# Patient Record
Sex: Male | Born: 1935 | Race: White | Hispanic: No | Marital: Married | Smoking: Never smoker
Health system: Southern US, Community
[De-identification: ages and names within clinical notes are randomized; demographics above are authoritative.]

## PROBLEM LIST (undated history)

## (undated) DIAGNOSIS — I2510 Atherosclerotic heart disease of native coronary artery without angina pectoris: Secondary | ICD-10-CM

## (undated) DIAGNOSIS — I10 Essential (primary) hypertension: Secondary | ICD-10-CM

## (undated) DIAGNOSIS — G609 Hereditary and idiopathic neuropathy, unspecified: Secondary | ICD-10-CM

## (undated) DIAGNOSIS — E538 Deficiency of other specified B group vitamins: Secondary | ICD-10-CM

## (undated) DIAGNOSIS — Z95810 Presence of automatic (implantable) cardiac defibrillator: Secondary | ICD-10-CM

## (undated) DIAGNOSIS — I5022 Chronic systolic (congestive) heart failure: Secondary | ICD-10-CM

## (undated) DIAGNOSIS — K219 Gastro-esophageal reflux disease without esophagitis: Secondary | ICD-10-CM

## (undated) DIAGNOSIS — Z951 Presence of aortocoronary bypass graft: Secondary | ICD-10-CM

## (undated) DIAGNOSIS — F3289 Other specified depressive episodes: Secondary | ICD-10-CM

## (undated) DIAGNOSIS — D539 Nutritional anemia, unspecified: Secondary | ICD-10-CM

## (undated) DIAGNOSIS — I639 Cerebral infarction, unspecified: Secondary | ICD-10-CM

## (undated) DIAGNOSIS — F329 Major depressive disorder, single episode, unspecified: Secondary | ICD-10-CM

## (undated) DIAGNOSIS — G20 Parkinson's disease: Secondary | ICD-10-CM

## (undated) DIAGNOSIS — E7800 Pure hypercholesterolemia, unspecified: Secondary | ICD-10-CM

## (undated) DIAGNOSIS — E109 Type 1 diabetes mellitus without complications: Secondary | ICD-10-CM

## (undated) DIAGNOSIS — N186 End stage renal disease: Secondary | ICD-10-CM

## (undated) DIAGNOSIS — G4733 Obstructive sleep apnea (adult) (pediatric): Secondary | ICD-10-CM

## (undated) DIAGNOSIS — K3184 Gastroparesis: Secondary | ICD-10-CM

## (undated) DIAGNOSIS — G20A1 Parkinson's disease without dyskinesia, without mention of fluctuations: Secondary | ICD-10-CM

## (undated) DIAGNOSIS — R569 Unspecified convulsions: Secondary | ICD-10-CM

## (undated) HISTORY — DX: Cerebral infarction, unspecified: I63.9

## (undated) HISTORY — DX: Essential (primary) hypertension: I10

## (undated) HISTORY — DX: Parkinson's disease without dyskinesia, without mention of fluctuations: G20.A1

## (undated) HISTORY — DX: Gastro-esophageal reflux disease without esophagitis: K21.9

## (undated) HISTORY — DX: Atherosclerotic heart disease of native coronary artery without angina pectoris: I25.10

## (undated) HISTORY — DX: Chronic systolic (congestive) heart failure: I50.22

## (undated) HISTORY — DX: Unspecified convulsions: R56.9

## (undated) HISTORY — DX: End stage renal disease: N18.6

## (undated) HISTORY — DX: Nutritional anemia, unspecified: D53.9

## (undated) HISTORY — DX: Deficiency of other specified B group vitamins: E53.8

## (undated) HISTORY — PX: SHUNT REPLACEMENT: SHX5403

## (undated) HISTORY — DX: Type 1 diabetes mellitus without complications: E10.9

## (undated) HISTORY — PX: CORONARY ARTERY BYPASS GRAFT: SHX141

## (undated) HISTORY — PX: CATARACT EXTRACTION, BILATERAL: SHX1313

## (undated) HISTORY — DX: Major depressive disorder, single episode, unspecified: F32.9

## (undated) HISTORY — DX: Pure hypercholesterolemia, unspecified: E78.00

## (undated) HISTORY — DX: Presence of aortocoronary bypass graft: Z95.1

## (undated) HISTORY — DX: Gastroparesis: K31.84

## (undated) HISTORY — DX: Presence of automatic (implantable) cardiac defibrillator: Z95.810

## (undated) HISTORY — PX: DIALYSIS FISTULA CREATION: SHX611

## (undated) HISTORY — PX: PACEMAKER PLACEMENT: SHX43

## (undated) HISTORY — PX: DOPPLER ECHOCARDIOGRAPHY: SHX263

## (undated) HISTORY — DX: Parkinson's disease: G20

## (undated) HISTORY — DX: Other specified depressive episodes: F32.89

## (undated) HISTORY — DX: Obstructive sleep apnea (adult) (pediatric): G47.33

## (undated) HISTORY — DX: Hereditary and idiopathic neuropathy, unspecified: G60.9

---

## 2001-12-25 DIAGNOSIS — Z951 Presence of aortocoronary bypass graft: Secondary | ICD-10-CM

## 2001-12-25 HISTORY — DX: Presence of aortocoronary bypass graft: Z95.1

## 2002-09-23 ENCOUNTER — Encounter: Payer: Self-pay | Admitting: Cardiothoracic Surgery

## 2002-09-25 ENCOUNTER — Encounter: Payer: Self-pay | Admitting: Cardiothoracic Surgery

## 2002-09-25 ENCOUNTER — Inpatient Hospital Stay (HOSPITAL_COMMUNITY): Admission: RE | Admit: 2002-09-25 | Discharge: 2002-09-30 | Payer: Self-pay | Admitting: Cardiothoracic Surgery

## 2002-09-25 DIAGNOSIS — Z951 Presence of aortocoronary bypass graft: Secondary | ICD-10-CM

## 2002-09-26 ENCOUNTER — Encounter: Payer: Self-pay | Admitting: Cardiothoracic Surgery

## 2002-09-27 ENCOUNTER — Encounter: Payer: Self-pay | Admitting: Cardiothoracic Surgery

## 2002-09-28 ENCOUNTER — Encounter: Payer: Self-pay | Admitting: Cardiothoracic Surgery

## 2004-02-10 ENCOUNTER — Other Ambulatory Visit: Payer: Self-pay

## 2004-02-11 ENCOUNTER — Other Ambulatory Visit: Payer: Self-pay

## 2004-04-25 ENCOUNTER — Emergency Department (HOSPITAL_COMMUNITY): Admission: EM | Admit: 2004-04-25 | Discharge: 2004-04-25 | Payer: Self-pay

## 2004-04-26 ENCOUNTER — Encounter: Admission: RE | Admit: 2004-04-26 | Discharge: 2004-04-26 | Payer: Self-pay | Admitting: Internal Medicine

## 2004-04-29 ENCOUNTER — Inpatient Hospital Stay (HOSPITAL_COMMUNITY): Admission: AD | Admit: 2004-04-29 | Discharge: 2004-05-04 | Payer: Self-pay | Admitting: Internal Medicine

## 2004-04-30 ENCOUNTER — Encounter (INDEPENDENT_AMBULATORY_CARE_PROVIDER_SITE_OTHER): Payer: Self-pay | Admitting: Specialist

## 2004-05-03 ENCOUNTER — Encounter: Payer: Self-pay | Admitting: Cardiology

## 2004-05-09 ENCOUNTER — Inpatient Hospital Stay (HOSPITAL_COMMUNITY): Admission: EM | Admit: 2004-05-09 | Discharge: 2004-05-12 | Payer: Self-pay | Admitting: Emergency Medicine

## 2004-09-10 ENCOUNTER — Encounter: Admission: RE | Admit: 2004-09-10 | Discharge: 2004-09-10 | Payer: Self-pay | Admitting: Internal Medicine

## 2004-09-11 ENCOUNTER — Encounter: Admission: RE | Admit: 2004-09-11 | Discharge: 2004-09-11 | Payer: Self-pay | Admitting: Internal Medicine

## 2004-11-02 ENCOUNTER — Ambulatory Visit (HOSPITAL_COMMUNITY): Admission: RE | Admit: 2004-11-02 | Discharge: 2004-11-02 | Payer: Self-pay | Admitting: Neurology

## 2004-11-04 ENCOUNTER — Ambulatory Visit: Payer: Self-pay | Admitting: Oncology

## 2004-12-09 ENCOUNTER — Ambulatory Visit: Payer: Self-pay | Admitting: Cardiovascular Disease

## 2004-12-29 ENCOUNTER — Ambulatory Visit: Payer: Self-pay | Admitting: Endocrinology

## 2005-01-02 ENCOUNTER — Ambulatory Visit: Payer: Self-pay | Admitting: Oncology

## 2005-01-03 ENCOUNTER — Ambulatory Visit: Payer: Self-pay

## 2005-01-12 ENCOUNTER — Ambulatory Visit: Payer: Self-pay | Admitting: Endocrinology

## 2005-02-24 ENCOUNTER — Ambulatory Visit: Payer: Self-pay | Admitting: Oncology

## 2005-04-18 ENCOUNTER — Ambulatory Visit: Payer: Self-pay | Admitting: Endocrinology

## 2005-04-21 ENCOUNTER — Ambulatory Visit: Payer: Self-pay | Admitting: Oncology

## 2005-06-30 ENCOUNTER — Ambulatory Visit: Payer: Self-pay | Admitting: Oncology

## 2005-08-24 ENCOUNTER — Ambulatory Visit: Payer: Self-pay | Admitting: Oncology

## 2005-10-26 ENCOUNTER — Ambulatory Visit: Payer: Self-pay | Admitting: Oncology

## 2005-11-03 ENCOUNTER — Ambulatory Visit: Payer: Self-pay | Admitting: Endocrinology

## 2005-11-28 ENCOUNTER — Encounter (HOSPITAL_COMMUNITY): Admission: RE | Admit: 2005-11-28 | Discharge: 2006-02-26 | Payer: Self-pay | Admitting: Nephrology

## 2005-12-22 ENCOUNTER — Ambulatory Visit: Payer: Self-pay | Admitting: Oncology

## 2006-02-01 ENCOUNTER — Ambulatory Visit: Payer: Self-pay | Admitting: Cardiovascular Disease

## 2006-02-15 ENCOUNTER — Ambulatory Visit: Payer: Self-pay | Admitting: Oncology

## 2006-02-15 ENCOUNTER — Inpatient Hospital Stay (HOSPITAL_COMMUNITY): Admission: EM | Admit: 2006-02-15 | Discharge: 2006-02-19 | Payer: Self-pay | Admitting: Emergency Medicine

## 2006-02-22 ENCOUNTER — Ambulatory Visit (HOSPITAL_COMMUNITY): Admission: RE | Admit: 2006-02-22 | Discharge: 2006-02-22 | Payer: Self-pay | Admitting: Internal Medicine

## 2006-04-18 ENCOUNTER — Ambulatory Visit: Payer: Self-pay | Admitting: Oncology

## 2006-04-19 LAB — CBC WITH DIFFERENTIAL/PLATELET
BASO%: 1.1 % (ref 0.0–2.0)
EOS%: 1.4 % (ref 0.0–7.0)
HCT: 31.4 % — ABNORMAL LOW (ref 38.7–49.9)
MCHC: 34.4 g/dL (ref 32.0–35.9)
MONO#: 0.5 10*3/uL (ref 0.1–0.9)
NEUT%: 59.8 % (ref 40.0–75.0)
RBC: 3.51 10*6/uL — ABNORMAL LOW (ref 4.20–5.71)
RDW: 17.2 % — ABNORMAL HIGH (ref 11.2–14.6)
WBC: 4.1 10*3/uL (ref 4.0–10.0)
lymph#: 1.1 10*3/uL (ref 0.9–3.3)

## 2006-04-26 ENCOUNTER — Encounter: Admission: RE | Admit: 2006-04-26 | Discharge: 2006-04-26 | Payer: Self-pay | Admitting: Neurology

## 2006-05-17 LAB — COMPREHENSIVE METABOLIC PANEL
ALT: <8 U/L (ref 0–40)
Alkaline Phosphatase: 19 U/L — ABNORMAL LOW (ref 39–117)
Creatinine, Ser: 2.1 mg/dL — ABNORMAL HIGH (ref 0.4–1.5)
Glucose, Bld: 160 mg/dL — ABNORMAL HIGH (ref 70–99)
Sodium: 138 mEq/L (ref 135–145)
Total Bilirubin: 1.1 mg/dL (ref 0.3–1.2)
Total Protein: 6.7 g/dL (ref 6.0–8.3)

## 2006-05-17 LAB — IRON AND TIBC
%SAT: 31 % (ref 20–55)
Iron: 73 ug/dL (ref 42–165)
TIBC: 233 ug/dL (ref 215–435)

## 2006-05-17 LAB — CBC WITH DIFFERENTIAL/PLATELET
BASO%: 0.4 % (ref 0.0–2.0)
LYMPH%: 24.7 % (ref 14.0–48.0)
MCHC: 34.5 g/dL (ref 32.0–35.9)
MCV: 93.3 fL (ref 81.6–98.0)
MONO%: 12.9 % (ref 0.0–13.0)
Platelets: 150 10*3/uL (ref 145–400)
RBC: 3.07 10*6/uL — ABNORMAL LOW (ref 4.20–5.71)

## 2006-06-12 ENCOUNTER — Ambulatory Visit: Payer: Self-pay | Admitting: Oncology

## 2006-06-14 LAB — CBC WITH DIFFERENTIAL/PLATELET
Basophils Absolute: 0 10*3/uL (ref 0.0–0.1)
EOS%: 1.1 % (ref 0.0–7.0)
HCT: 31.9 % — ABNORMAL LOW (ref 38.7–49.9)
HGB: 11.1 g/dL — ABNORMAL LOW (ref 13.0–17.1)
LYMPH%: 24.5 % (ref 14.0–48.0)
MCH: 31.5 pg (ref 28.0–33.4)
MCHC: 35 g/dL (ref 32.0–35.9)
NEUT%: 61.5 % (ref 40.0–75.0)
Platelets: 140 10*3/uL — ABNORMAL LOW (ref 145–400)
lymph#: 1.1 10*3/uL (ref 0.9–3.3)

## 2006-07-12 LAB — CBC WITH DIFFERENTIAL/PLATELET
BASO%: 1 % (ref 0.0–2.0)
Basophils Absolute: 0 10*3/uL (ref 0.0–0.1)
Eosinophils Absolute: 0 10*3/uL (ref 0.0–0.5)
HCT: 33.1 % — ABNORMAL LOW (ref 38.7–49.9)
HGB: 11.4 g/dL — ABNORMAL LOW (ref 13.0–17.1)
LYMPH%: 25.7 % (ref 14.0–48.0)
MCHC: 34.3 g/dL (ref 32.0–35.9)
MONO#: 0.5 10*3/uL (ref 0.1–0.9)
NEUT#: 2.5 10*3/uL (ref 1.5–6.5)
NEUT%: 60 % (ref 40.0–75.0)
Platelets: 167 10*3/uL (ref 145–400)
WBC: 4.1 10*3/uL (ref 4.0–10.0)
lymph#: 1.1 10*3/uL (ref 0.9–3.3)

## 2006-07-27 ENCOUNTER — Ambulatory Visit: Payer: Self-pay | Admitting: Cardiovascular Disease

## 2006-08-12 ENCOUNTER — Ambulatory Visit: Payer: Self-pay | Admitting: Oncology

## 2006-08-21 LAB — CBC WITH DIFFERENTIAL/PLATELET
Basophils Absolute: 0 10*3/uL (ref 0.0–0.1)
Eosinophils Absolute: 0 10*3/uL (ref 0.0–0.5)
LYMPH%: 15.6 % (ref 14.0–48.0)
MCV: 90.3 fL (ref 81.6–98.0)
MONO%: 9.9 % (ref 0.0–13.0)
NEUT#: 3.4 10*3/uL (ref 1.5–6.5)
NEUT%: 73.2 % (ref 40.0–75.0)
Platelets: 146 10*3/uL (ref 145–400)
RBC: 3.39 10*6/uL — ABNORMAL LOW (ref 4.20–5.71)

## 2006-08-21 LAB — COMPREHENSIVE METABOLIC PANEL
Alkaline Phosphatase: 15 U/L — ABNORMAL LOW (ref 39–117)
BUN: 48 mg/dL — ABNORMAL HIGH (ref 6–23)
Creatinine, Ser: 2.1 mg/dL — ABNORMAL HIGH (ref 0.40–1.50)
Glucose, Bld: 168 mg/dL — ABNORMAL HIGH (ref 70–99)
Sodium: 140 mEq/L (ref 135–145)
Total Bilirubin: 0.9 mg/dL (ref 0.3–1.2)

## 2006-08-21 LAB — IRON AND TIBC
TIBC: 256 ug/dL (ref 215–435)
UIBC: 144 ug/dL

## 2006-08-21 LAB — LACTATE DEHYDROGENASE: LDH: 174 U/L (ref 94–250)

## 2006-09-18 LAB — CBC WITH DIFFERENTIAL/PLATELET
BASO%: 0.7 % (ref 0.0–2.0)
Eosinophils Absolute: 0.1 10*3/uL (ref 0.0–0.5)
LYMPH%: 21.5 % (ref 14.0–48.0)
MCHC: 35.4 g/dL (ref 32.0–35.9)
MONO#: 0.7 10*3/uL (ref 0.1–0.9)
NEUT#: 3.3 10*3/uL (ref 1.5–6.5)
Platelets: 206 10*3/uL (ref 145–400)
RBC: 3.53 10*6/uL — ABNORMAL LOW (ref 4.20–5.71)
RDW: 15.9 % — ABNORMAL HIGH (ref 11.2–14.6)
WBC: 5.3 10*3/uL (ref 4.0–10.0)
lymph#: 1.1 10*3/uL (ref 0.9–3.3)

## 2006-10-12 ENCOUNTER — Ambulatory Visit: Payer: Self-pay | Admitting: Oncology

## 2006-10-16 LAB — CBC WITH DIFFERENTIAL/PLATELET
Basophils Absolute: 0 10*3/uL (ref 0.0–0.1)
Eosinophils Absolute: 0 10*3/uL (ref 0.0–0.5)
HGB: 10 g/dL — ABNORMAL LOW (ref 13.0–17.1)
MCV: 89.3 fL (ref 81.6–98.0)
MONO%: 9.5 % (ref 0.0–13.0)
NEUT#: 3.4 10*3/uL (ref 1.5–6.5)
Platelets: 229 10*3/uL (ref 145–400)
RDW: 16.2 % — ABNORMAL HIGH (ref 11.2–14.6)

## 2006-10-17 ENCOUNTER — Ambulatory Visit: Payer: Self-pay | Admitting: Endocrinology

## 2006-11-13 LAB — CBC WITH DIFFERENTIAL/PLATELET
Basophils Absolute: 0 10*3/uL (ref 0.0–0.1)
EOS%: 1 % (ref 0.0–7.0)
Eosinophils Absolute: 0 10*3/uL (ref 0.0–0.5)
HGB: 11.1 g/dL — ABNORMAL LOW (ref 13.0–17.1)
MONO#: 0.5 10*3/uL (ref 0.1–0.9)
NEUT#: 3.3 10*3/uL (ref 1.5–6.5)
RDW: 15.9 % — ABNORMAL HIGH (ref 11.2–14.6)
lymph#: 0.8 10*3/uL — ABNORMAL LOW (ref 0.9–3.3)

## 2006-11-13 LAB — FERRITIN: Ferritin: 244 ng/mL (ref 22–322)

## 2006-11-13 LAB — IRON AND TIBC
%SAT: 30 % (ref 20–55)
TIBC: 261 ug/dL (ref 215–435)

## 2006-12-07 ENCOUNTER — Ambulatory Visit: Payer: Self-pay | Admitting: Oncology

## 2006-12-12 LAB — CBC WITH DIFFERENTIAL/PLATELET
Eosinophils Absolute: 0.1 10*3/uL (ref 0.0–0.5)
HCT: 33.6 % — ABNORMAL LOW (ref 38.7–49.9)
LYMPH%: 25.5 % (ref 14.0–48.0)
MCV: 89.5 fL (ref 81.6–98.0)
MONO%: 12.3 % (ref 0.0–13.0)
NEUT#: 2.5 10*3/uL (ref 1.5–6.5)
NEUT%: 60.1 % (ref 40.0–75.0)
Platelets: 185 10*3/uL (ref 145–400)
RBC: 3.75 10*6/uL — ABNORMAL LOW (ref 4.20–5.71)

## 2007-01-09 LAB — CBC WITH DIFFERENTIAL/PLATELET
BASO%: 0.5 % (ref 0.0–2.0)
LYMPH%: 20.6 % (ref 14.0–48.0)
MCHC: 33.5 g/dL (ref 32.0–35.9)
MONO#: 0.6 10*3/uL (ref 0.1–0.9)
NEUT#: 3 10*3/uL (ref 1.5–6.5)
RBC: 4.04 10*6/uL — ABNORMAL LOW (ref 4.20–5.71)
RDW: 15.5 % — ABNORMAL HIGH (ref 11.2–14.6)
WBC: 4.7 10*3/uL (ref 4.0–10.0)
lymph#: 1 10*3/uL (ref 0.9–3.3)

## 2007-01-29 ENCOUNTER — Ambulatory Visit: Payer: Self-pay | Admitting: Endocrinology

## 2007-01-29 LAB — CONVERTED CEMR LAB: Hgb A1c MFr Bld: 8 % — ABNORMAL HIGH (ref 4.6–6.0)

## 2007-02-01 ENCOUNTER — Ambulatory Visit: Payer: Self-pay | Admitting: Oncology

## 2007-02-06 LAB — CBC WITH DIFFERENTIAL/PLATELET
BASO%: 0.9 % (ref 0.0–2.0)
Eosinophils Absolute: 0.1 10*3/uL (ref 0.0–0.5)
HCT: 30.1 % — ABNORMAL LOW (ref 38.7–49.9)
HGB: 10.9 g/dL — ABNORMAL LOW (ref 13.0–17.1)
MCHC: 36.2 g/dL — ABNORMAL HIGH (ref 32.0–35.9)
MONO#: 0.7 10*3/uL (ref 0.1–0.9)
NEUT#: 2.8 10*3/uL (ref 1.5–6.5)
NEUT%: 55.9 % (ref 40.0–75.0)
WBC: 5.1 10*3/uL (ref 4.0–10.0)
lymph#: 1.4 10*3/uL (ref 0.9–3.3)

## 2007-03-06 LAB — CBC WITH DIFFERENTIAL/PLATELET
Basophils Absolute: 0 10*3/uL (ref 0.0–0.1)
EOS%: 1.1 % (ref 0.0–7.0)
HCT: 31.2 % — ABNORMAL LOW (ref 38.7–49.9)
HGB: 11.1 g/dL — ABNORMAL LOW (ref 13.0–17.1)
MCH: 32.2 pg (ref 28.0–33.4)
MONO#: 0.4 10*3/uL (ref 0.1–0.9)
NEUT%: 61.1 % (ref 40.0–75.0)
lymph#: 1.1 10*3/uL (ref 0.9–3.3)

## 2007-03-12 LAB — COMPREHENSIVE METABOLIC PANEL
AST: 16 U/L (ref 0–37)
Alkaline Phosphatase: 24 U/L — ABNORMAL LOW (ref 39–117)
BUN: 48 mg/dL — ABNORMAL HIGH (ref 6–23)
Calcium: 10.1 mg/dL (ref 8.4–10.5)
Creatinine, Ser: 2.53 mg/dL — ABNORMAL HIGH (ref 0.40–1.50)

## 2007-03-12 LAB — CBC WITH DIFFERENTIAL/PLATELET
Basophils Absolute: 0.1 10*3/uL (ref 0.0–0.1)
EOS%: 1.4 % (ref 0.0–7.0)
HCT: 31.9 % — ABNORMAL LOW (ref 38.7–49.9)
HGB: 11.5 g/dL — ABNORMAL LOW (ref 13.0–17.1)
MCH: 32.5 pg (ref 28.0–33.4)
MCV: 90.1 fL (ref 81.6–98.0)
MONO%: 12.7 % (ref 0.0–13.0)
NEUT%: 58.6 % (ref 40.0–75.0)

## 2007-03-12 LAB — FERRITIN: Ferritin: 216 ng/mL (ref 22–322)

## 2007-03-12 LAB — IRON AND TIBC: UIBC: 174 ug/dL

## 2007-03-29 ENCOUNTER — Ambulatory Visit: Payer: Self-pay | Admitting: Oncology

## 2007-04-03 LAB — CBC WITH DIFFERENTIAL/PLATELET
BASO%: 0.7 % (ref 0.0–2.0)
HCT: 31.3 % — ABNORMAL LOW (ref 38.7–49.9)
MCHC: 35.9 g/dL (ref 32.0–35.9)
MONO#: 0.6 10*3/uL (ref 0.1–0.9)
NEUT%: 69.1 % (ref 40.0–75.0)
WBC: 5.1 10*3/uL (ref 4.0–10.0)
lymph#: 0.9 10*3/uL (ref 0.9–3.3)

## 2007-04-09 ENCOUNTER — Ambulatory Visit: Payer: Self-pay | Admitting: Endocrinology

## 2007-05-01 LAB — CBC WITH DIFFERENTIAL/PLATELET
Basophils Absolute: 0 10*3/uL (ref 0.0–0.1)
Eosinophils Absolute: 0 10*3/uL (ref 0.0–0.5)
HCT: 30.9 % — ABNORMAL LOW (ref 38.7–49.9)
HGB: 11.1 g/dL — ABNORMAL LOW (ref 13.0–17.1)
MCH: 32.1 pg (ref 28.0–33.4)
MONO#: 0.4 10*3/uL (ref 0.1–0.9)
NEUT#: 2.5 10*3/uL (ref 1.5–6.5)
NEUT%: 63.7 % (ref 40.0–75.0)
WBC: 3.9 10*3/uL — ABNORMAL LOW (ref 4.0–10.0)
lymph#: 0.9 10*3/uL (ref 0.9–3.3)

## 2007-05-27 ENCOUNTER — Ambulatory Visit: Payer: Self-pay | Admitting: Oncology

## 2007-05-28 LAB — CBC WITH DIFFERENTIAL/PLATELET
Basophils Absolute: 0 10*3/uL (ref 0.0–0.1)
EOS%: 1.6 % (ref 0.0–7.0)
Eosinophils Absolute: 0.1 10*3/uL (ref 0.0–0.5)
HCT: 33.9 % — ABNORMAL LOW (ref 38.7–49.9)
HGB: 12.1 g/dL — ABNORMAL LOW (ref 13.0–17.1)
MCH: 31.6 pg (ref 28.0–33.4)
MCV: 88.6 fL (ref 81.6–98.0)
MONO%: 12 % (ref 0.0–13.0)
NEUT%: 65 % (ref 40.0–75.0)
Platelets: 166 10*3/uL (ref 145–400)

## 2007-06-25 LAB — COMPREHENSIVE METABOLIC PANEL
ALT: 15 U/L (ref 0–53)
AST: 19 U/L (ref 0–37)
Alkaline Phosphatase: 20 U/L — ABNORMAL LOW (ref 39–117)
Sodium: 139 mEq/L (ref 135–145)
Total Bilirubin: 0.9 mg/dL (ref 0.3–1.2)
Total Protein: 7.2 g/dL (ref 6.0–8.3)

## 2007-06-25 LAB — CBC WITH DIFFERENTIAL/PLATELET
BASO%: 0.8 % (ref 0.0–2.0)
HCT: 35.4 % — ABNORMAL LOW (ref 38.7–49.9)
HGB: 12.5 g/dL — ABNORMAL LOW (ref 13.0–17.1)
LYMPH%: 22.1 % (ref 14.0–48.0)
MCH: 31.4 pg (ref 28.0–33.4)
MONO#: 0.5 10*3/uL (ref 0.1–0.9)
MONO%: 12.5 % (ref 0.0–13.0)
NEUT#: 2.6 10*3/uL (ref 1.5–6.5)
RDW: 15.6 % — ABNORMAL HIGH (ref 11.2–14.6)
lymph#: 0.9 10*3/uL (ref 0.9–3.3)

## 2007-06-25 LAB — IRON AND TIBC
%SAT: 49 % (ref 20–55)
Iron: 122 ug/dL (ref 42–165)

## 2007-06-25 LAB — FERRITIN: Ferritin: 281 ng/mL (ref 22–322)

## 2007-07-18 ENCOUNTER — Ambulatory Visit: Payer: Self-pay | Admitting: Oncology

## 2007-07-23 LAB — CBC WITH DIFFERENTIAL/PLATELET
Basophils Absolute: 0 10*3/uL (ref 0.0–0.1)
EOS%: 1.4 % (ref 0.0–7.0)
HGB: 12.6 g/dL — ABNORMAL LOW (ref 13.0–17.1)
MCH: 32.1 pg (ref 28.0–33.4)
NEUT#: 2.8 10*3/uL (ref 1.5–6.5)
RDW: 15.1 % — ABNORMAL HIGH (ref 11.2–14.6)
lymph#: 1.1 10*3/uL (ref 0.9–3.3)

## 2007-07-29 ENCOUNTER — Encounter: Payer: Self-pay | Admitting: Endocrinology

## 2007-07-29 DIAGNOSIS — F329 Major depressive disorder, single episode, unspecified: Secondary | ICD-10-CM

## 2007-07-29 DIAGNOSIS — F3289 Other specified depressive episodes: Secondary | ICD-10-CM | POA: Insufficient documentation

## 2007-07-29 DIAGNOSIS — I10 Essential (primary) hypertension: Secondary | ICD-10-CM | POA: Insufficient documentation

## 2007-07-29 DIAGNOSIS — I2510 Atherosclerotic heart disease of native coronary artery without angina pectoris: Secondary | ICD-10-CM | POA: Insufficient documentation

## 2007-07-29 DIAGNOSIS — K219 Gastro-esophageal reflux disease without esophagitis: Secondary | ICD-10-CM

## 2007-08-08 ENCOUNTER — Ambulatory Visit: Payer: Self-pay | Admitting: Gastroenterology

## 2007-08-19 ENCOUNTER — Encounter: Payer: Self-pay | Admitting: Gastroenterology

## 2007-08-19 ENCOUNTER — Ambulatory Visit: Payer: Self-pay | Admitting: Gastroenterology

## 2007-08-19 ENCOUNTER — Encounter (INDEPENDENT_AMBULATORY_CARE_PROVIDER_SITE_OTHER): Payer: Self-pay | Admitting: *Deleted

## 2007-09-12 ENCOUNTER — Ambulatory Visit: Payer: Self-pay | Admitting: Oncology

## 2007-09-16 ENCOUNTER — Emergency Department (HOSPITAL_COMMUNITY): Admission: EM | Admit: 2007-09-16 | Discharge: 2007-09-16 | Payer: Self-pay | Admitting: Emergency Medicine

## 2007-09-17 LAB — CBC WITH DIFFERENTIAL/PLATELET
Basophils Absolute: 0 10*3/uL (ref 0.0–0.1)
Eosinophils Absolute: 0 10*3/uL (ref 0.0–0.5)
HGB: 11.7 g/dL — ABNORMAL LOW (ref 13.0–17.1)
MCV: 90.1 fL (ref 81.6–98.0)
MONO#: 0.4 10*3/uL (ref 0.1–0.9)
MONO%: 12.4 % (ref 0.0–13.0)
NEUT#: 2.5 10*3/uL (ref 1.5–6.5)
RDW: 15.1 % — ABNORMAL HIGH (ref 11.2–14.6)
lymph#: 0.5 10*3/uL — ABNORMAL LOW (ref 0.9–3.3)

## 2007-10-09 ENCOUNTER — Ambulatory Visit: Payer: Self-pay | Admitting: Cardiovascular Disease

## 2007-10-15 LAB — LACTATE DEHYDROGENASE: LDH: 197 U/L (ref 94–250)

## 2007-10-15 LAB — CBC WITH DIFFERENTIAL/PLATELET
Basophils Absolute: 0 10*3/uL (ref 0.0–0.1)
Eosinophils Absolute: 0 10*3/uL (ref 0.0–0.5)
HCT: 32.4 % — ABNORMAL LOW (ref 38.7–49.9)
HGB: 11.6 g/dL — ABNORMAL LOW (ref 13.0–17.1)
LYMPH%: 21.9 % (ref 14.0–48.0)
MCV: 88.7 fL (ref 81.6–98.0)
MONO%: 12.2 % (ref 0.0–13.0)
NEUT#: 2.4 10*3/uL (ref 1.5–6.5)
NEUT%: 64.2 % (ref 40.0–75.0)
Platelets: 179 10*3/uL (ref 145–400)

## 2007-10-15 LAB — IRON AND TIBC
%SAT: 37 % (ref 20–55)
Iron: 90 ug/dL (ref 42–165)
UIBC: 154 ug/dL

## 2007-10-15 LAB — COMPREHENSIVE METABOLIC PANEL
Albumin: 4.2 g/dL (ref 3.5–5.2)
Alkaline Phosphatase: 26 U/L — ABNORMAL LOW (ref 39–117)
BUN: 62 mg/dL — ABNORMAL HIGH (ref 6–23)
CO2: 27 mEq/L (ref 19–32)
Glucose, Bld: 154 mg/dL — ABNORMAL HIGH (ref 70–99)
Total Bilirubin: 0.9 mg/dL (ref 0.3–1.2)

## 2007-10-15 LAB — FERRITIN: Ferritin: 306 ng/mL (ref 22–322)

## 2007-10-18 ENCOUNTER — Ambulatory Visit: Payer: Self-pay | Admitting: Endocrinology

## 2007-10-18 ENCOUNTER — Encounter: Payer: Self-pay | Admitting: Endocrinology

## 2007-10-21 LAB — CONVERTED CEMR LAB: Hgb A1c MFr Bld: 6.8 % — ABNORMAL HIGH (ref 4.6–6.0)

## 2007-10-22 ENCOUNTER — Ambulatory Visit: Payer: Self-pay

## 2007-11-03 ENCOUNTER — Inpatient Hospital Stay (HOSPITAL_COMMUNITY): Admission: EM | Admit: 2007-11-03 | Discharge: 2007-11-11 | Payer: Self-pay | Admitting: Internal Medicine

## 2007-11-03 ENCOUNTER — Ambulatory Visit: Payer: Self-pay | Admitting: Cardiology

## 2007-11-03 ENCOUNTER — Ambulatory Visit: Payer: Self-pay | Admitting: Internal Medicine

## 2007-11-03 ENCOUNTER — Encounter: Payer: Self-pay | Admitting: Emergency Medicine

## 2007-11-05 ENCOUNTER — Encounter (INDEPENDENT_AMBULATORY_CARE_PROVIDER_SITE_OTHER): Payer: Self-pay | Admitting: Internal Medicine

## 2007-11-06 ENCOUNTER — Ambulatory Visit: Payer: Self-pay | Admitting: Internal Medicine

## 2007-11-07 ENCOUNTER — Encounter: Payer: Self-pay | Admitting: Pulmonary Disease

## 2007-11-07 ENCOUNTER — Ambulatory Visit: Payer: Self-pay | Admitting: Oncology

## 2007-11-20 LAB — CBC WITH DIFFERENTIAL/PLATELET
BASO%: 1 % (ref 0.0–2.0)
EOS%: 1.4 % (ref 0.0–7.0)
HCT: 29 % — ABNORMAL LOW (ref 38.7–49.9)
MCH: 31.6 pg (ref 28.0–33.4)
MCHC: 36.1 g/dL — ABNORMAL HIGH (ref 32.0–35.9)
MCV: 87.4 fL (ref 81.6–98.0)
MONO%: 7.2 % (ref 0.0–13.0)
NEUT%: 80.8 % — ABNORMAL HIGH (ref 40.0–75.0)
lymph#: 0.9 10*3/uL (ref 0.9–3.3)

## 2007-11-26 ENCOUNTER — Telehealth: Payer: Self-pay | Admitting: Endocrinology

## 2007-11-28 ENCOUNTER — Ambulatory Visit: Payer: Self-pay | Admitting: Cardiovascular Disease

## 2007-11-29 ENCOUNTER — Telehealth: Payer: Self-pay | Admitting: Internal Medicine

## 2007-12-02 ENCOUNTER — Encounter: Payer: Self-pay | Admitting: Endocrinology

## 2007-12-10 LAB — CBC WITH DIFFERENTIAL/PLATELET
BASO%: 1.5 % (ref 0.0–2.0)
Basophils Absolute: 0.1 10*3/uL (ref 0.0–0.1)
EOS%: 1.2 % (ref 0.0–7.0)
HGB: 11.4 g/dL — ABNORMAL LOW (ref 13.0–17.1)
MCH: 31.4 pg (ref 28.0–33.4)
MCHC: 35.4 g/dL (ref 32.0–35.9)
MCV: 88.8 fL (ref 81.6–98.0)
MONO%: 12.7 % (ref 0.0–13.0)
RBC: 3.61 10*6/uL — ABNORMAL LOW (ref 4.20–5.71)
RDW: 17 % — ABNORMAL HIGH (ref 11.2–14.6)
lymph#: 1 10*3/uL (ref 0.9–3.3)

## 2007-12-27 ENCOUNTER — Ambulatory Visit: Payer: Self-pay | Admitting: Endocrinology

## 2008-01-03 ENCOUNTER — Ambulatory Visit: Payer: Self-pay | Admitting: Oncology

## 2008-01-07 ENCOUNTER — Encounter: Payer: Self-pay | Admitting: Endocrinology

## 2008-01-07 LAB — CBC WITH DIFFERENTIAL/PLATELET
Basophils Absolute: 0.1 10*3/uL (ref 0.0–0.1)
Eosinophils Absolute: 0.1 10*3/uL (ref 0.0–0.5)
HGB: 13.4 g/dL (ref 13.0–17.1)
MONO#: 0.6 10*3/uL (ref 0.1–0.9)
MONO%: 12.6 % (ref 0.0–13.0)
NEUT#: 2.8 10*3/uL (ref 1.5–6.5)
RBC: 4.32 10*6/uL (ref 4.20–5.71)
RDW: 16 % — ABNORMAL HIGH (ref 11.2–14.6)
WBC: 4.5 10*3/uL (ref 4.0–10.0)
lymph#: 1 10*3/uL (ref 0.9–3.3)

## 2008-01-17 ENCOUNTER — Ambulatory Visit: Payer: Self-pay | Admitting: Cardiovascular Disease

## 2008-01-22 ENCOUNTER — Encounter: Payer: Self-pay | Admitting: Endocrinology

## 2008-02-04 LAB — CBC WITH DIFFERENTIAL/PLATELET
BASO%: 2.8 % — ABNORMAL HIGH (ref 0.0–2.0)
LYMPH%: 23.8 % (ref 14.0–48.0)
MCHC: 34.9 g/dL (ref 32.0–35.9)
MONO#: 0.5 10*3/uL (ref 0.1–0.9)
NEUT#: 2.4 10*3/uL (ref 1.5–6.5)
Platelets: 193 10*3/uL (ref 145–400)
RBC: 3.58 10*6/uL — ABNORMAL LOW (ref 4.20–5.71)
RDW: 15.6 % — ABNORMAL HIGH (ref 11.2–14.6)
WBC: 4.1 10*3/uL (ref 4.0–10.0)
lymph#: 1 10*3/uL (ref 0.9–3.3)

## 2008-02-14 LAB — CBC WITH DIFFERENTIAL/PLATELET
BASO%: 0.4 % (ref 0.0–2.0)
Eosinophils Absolute: 0.1 10*3/uL (ref 0.0–0.5)
HCT: 32.1 % — ABNORMAL LOW (ref 38.7–49.9)
MCHC: 36.2 g/dL — ABNORMAL HIGH (ref 32.0–35.9)
MONO#: 0.4 10*3/uL (ref 0.1–0.9)
NEUT#: 2.3 10*3/uL (ref 1.5–6.5)
NEUT%: 63.6 % (ref 40.0–75.0)
WBC: 3.6 10*3/uL — ABNORMAL LOW (ref 4.0–10.0)
lymph#: 0.8 10*3/uL — ABNORMAL LOW (ref 0.9–3.3)

## 2008-02-15 LAB — COMPREHENSIVE METABOLIC PANEL
ALT: <8 U/L (ref 0–53)
Albumin: 4.5 g/dL (ref 3.5–5.2)
BUN: 41 mg/dL — ABNORMAL HIGH (ref 6–23)
CO2: 26 mEq/L (ref 19–32)
Calcium: 9.6 mg/dL (ref 8.4–10.5)
Chloride: 105 mEq/L (ref 96–112)
Creatinine, Ser: 2.36 mg/dL — ABNORMAL HIGH (ref 0.40–1.50)

## 2008-02-15 LAB — LACTATE DEHYDROGENASE: LDH: 190 U/L (ref 94–250)

## 2008-02-15 LAB — IRON AND TIBC
TIBC: 239 ug/dL (ref 215–435)
UIBC: 177 ug/dL

## 2008-02-28 ENCOUNTER — Ambulatory Visit: Payer: Self-pay | Admitting: Oncology

## 2008-03-03 LAB — CBC WITH DIFFERENTIAL/PLATELET
BASO%: 1.1 % (ref 0.0–2.0)
EOS%: 1.8 % (ref 0.0–7.0)
LYMPH%: 21.2 % (ref 14.0–48.0)
MCH: 32 pg (ref 28.0–33.4)
MCHC: 36 g/dL — ABNORMAL HIGH (ref 32.0–35.9)
MCV: 89.1 fL (ref 81.6–98.0)
MONO%: 13.2 % — ABNORMAL HIGH (ref 0.0–13.0)
Platelets: 163 10*3/uL (ref 145–400)
RBC: 3.63 10*6/uL — ABNORMAL LOW (ref 4.20–5.71)
RDW: 15.6 % — ABNORMAL HIGH (ref 11.2–14.6)

## 2008-03-26 ENCOUNTER — Ambulatory Visit: Payer: Self-pay | Admitting: Endocrinology

## 2008-03-26 DIAGNOSIS — E7800 Pure hypercholesterolemia, unspecified: Secondary | ICD-10-CM

## 2008-03-26 LAB — CONVERTED CEMR LAB: Hgb A1c MFr Bld: 6.5 % — ABNORMAL HIGH (ref 4.6–6.0)

## 2008-03-31 DIAGNOSIS — D539 Nutritional anemia, unspecified: Secondary | ICD-10-CM | POA: Insufficient documentation

## 2008-03-31 DIAGNOSIS — G20 Parkinson's disease: Secondary | ICD-10-CM

## 2008-03-31 LAB — CBC WITH DIFFERENTIAL/PLATELET
Basophils Absolute: 0 10*3/uL (ref 0.0–0.1)
EOS%: 0.9 % (ref 0.0–7.0)
Eosinophils Absolute: 0 10*3/uL (ref 0.0–0.5)
HGB: 11.2 g/dL — ABNORMAL LOW (ref 13.0–17.1)
LYMPH%: 23.1 % (ref 14.0–48.0)
MCH: 32.5 pg (ref 28.0–33.4)
MCV: 91 fL (ref 81.6–98.0)
MONO%: 12.8 % (ref 0.0–13.0)
NEUT#: 2.4 10*3/uL (ref 1.5–6.5)
Platelets: 160 10*3/uL (ref 145–400)

## 2008-04-24 ENCOUNTER — Ambulatory Visit: Payer: Self-pay | Admitting: Oncology

## 2008-04-28 LAB — CBC WITH DIFFERENTIAL/PLATELET
Basophils Absolute: 0 10*3/uL (ref 0.0–0.1)
EOS%: 1.3 % (ref 0.0–7.0)
Eosinophils Absolute: 0.1 10*3/uL (ref 0.0–0.5)
HCT: 33.6 % — ABNORMAL LOW (ref 38.7–49.9)
HGB: 11.8 g/dL — ABNORMAL LOW (ref 13.0–17.1)
MCH: 32 pg (ref 28.0–33.4)
MCV: 90.9 fL (ref 81.6–98.0)
MONO%: 9.5 % (ref 0.0–13.0)
NEUT#: 2.8 10*3/uL (ref 1.5–6.5)
NEUT%: 65.5 % (ref 40.0–75.0)
RDW: 15.5 % — ABNORMAL HIGH (ref 11.2–14.6)

## 2008-05-26 LAB — CBC WITH DIFFERENTIAL/PLATELET
BASO%: 1.5 % (ref 0.0–2.0)
Basophils Absolute: 0.1 10*3/uL (ref 0.0–0.1)
EOS%: 1.4 % (ref 0.0–7.0)
MCH: 31.9 pg (ref 28.0–33.4)
MCHC: 35.6 g/dL (ref 32.0–35.9)
MCV: 89.5 fL (ref 81.6–98.0)
MONO%: 12.3 % (ref 0.0–13.0)
RBC: 3.71 10*6/uL — ABNORMAL LOW (ref 4.20–5.71)
RDW: 15.3 % — ABNORMAL HIGH (ref 11.2–14.6)
lymph#: 1 10*3/uL (ref 0.9–3.3)

## 2008-06-08 ENCOUNTER — Encounter: Payer: Self-pay | Admitting: Endocrinology

## 2008-06-19 ENCOUNTER — Ambulatory Visit: Payer: Self-pay | Admitting: Oncology

## 2008-06-22 ENCOUNTER — Telehealth (INDEPENDENT_AMBULATORY_CARE_PROVIDER_SITE_OTHER): Payer: Self-pay | Admitting: *Deleted

## 2008-06-23 LAB — CBC WITH DIFFERENTIAL/PLATELET
Basophils Absolute: 0.1 10*3/uL (ref 0.0–0.1)
EOS%: 1.5 % (ref 0.0–7.0)
Eosinophils Absolute: 0.1 10*3/uL (ref 0.0–0.5)
HGB: 11.9 g/dL — ABNORMAL LOW (ref 13.0–17.1)
MCV: 89.2 fL (ref 81.6–98.0)
MONO%: 13.1 % — ABNORMAL HIGH (ref 0.0–13.0)
NEUT#: 2.6 10*3/uL (ref 1.5–6.5)
RBC: 3.71 10*6/uL — ABNORMAL LOW (ref 4.20–5.71)
RDW: 15.5 % — ABNORMAL HIGH (ref 11.2–14.6)
lymph#: 0.8 10*3/uL — ABNORMAL LOW (ref 0.9–3.3)

## 2008-06-23 LAB — COMPREHENSIVE METABOLIC PANEL
AST: 19 U/L (ref 0–37)
Albumin: 4.6 g/dL (ref 3.5–5.2)
Alkaline Phosphatase: 20 U/L — ABNORMAL LOW (ref 39–117)
Calcium: 9.9 mg/dL (ref 8.4–10.5)
Chloride: 100 mEq/L (ref 96–112)
Glucose, Bld: 58 mg/dL — ABNORMAL LOW (ref 70–99)
Potassium: 3.8 mEq/L (ref 3.5–5.3)
Sodium: 138 mEq/L (ref 135–145)
Total Protein: 7.4 g/dL (ref 6.0–8.3)

## 2008-06-23 LAB — FERRITIN: Ferritin: 203 ng/mL (ref 22–322)

## 2008-07-17 LAB — CBC WITH DIFFERENTIAL/PLATELET
Basophils Absolute: 0.1 10*3/uL (ref 0.0–0.1)
Eosinophils Absolute: 0.1 10*3/uL (ref 0.0–0.5)
HGB: 12 g/dL — ABNORMAL LOW (ref 13.0–17.1)
MCV: 90.4 fL (ref 81.6–98.0)
MONO#: 0.6 10*3/uL (ref 0.1–0.9)
MONO%: 13 % (ref 0.0–13.0)
NEUT#: 3 10*3/uL (ref 1.5–6.5)
RBC: 3.81 10*6/uL — ABNORMAL LOW (ref 4.20–5.71)
RDW: 15.8 % — ABNORMAL HIGH (ref 11.2–14.6)
WBC: 4.6 10*3/uL (ref 4.0–10.0)

## 2008-08-11 ENCOUNTER — Encounter: Payer: Self-pay | Admitting: Endocrinology

## 2008-08-14 ENCOUNTER — Ambulatory Visit: Payer: Self-pay | Admitting: Oncology

## 2008-08-18 LAB — CBC WITH DIFFERENTIAL/PLATELET
Basophils Absolute: 0 10*3/uL (ref 0.0–0.1)
EOS%: 1 % (ref 0.0–7.0)
Eosinophils Absolute: 0 10*3/uL (ref 0.0–0.5)
HGB: 12.3 g/dL — ABNORMAL LOW (ref 13.0–17.1)
MCH: 32.4 pg (ref 28.0–33.4)
MONO%: 10.6 % (ref 0.0–13.0)
NEUT#: 2.9 10*3/uL (ref 1.5–6.5)
RBC: 3.79 10*6/uL — ABNORMAL LOW (ref 4.20–5.71)
RDW: 16.1 % — ABNORMAL HIGH (ref 11.2–14.6)
lymph#: 0.8 10*3/uL — ABNORMAL LOW (ref 0.9–3.3)

## 2008-08-24 ENCOUNTER — Ambulatory Visit: Payer: Self-pay | Admitting: Endocrinology

## 2008-09-15 LAB — CBC WITH DIFFERENTIAL/PLATELET
BASO%: 0.8 % (ref 0.0–2.0)
Basophils Absolute: 0 10*3/uL (ref 0.0–0.1)
EOS%: 1.3 % (ref 0.0–7.0)
Eosinophils Absolute: 0.1 10*3/uL (ref 0.0–0.5)
HCT: 30.1 % — ABNORMAL LOW (ref 38.7–49.9)
HGB: 10.8 g/dL — ABNORMAL LOW (ref 13.0–17.1)
LYMPH%: 20.5 % (ref 14.0–48.0)
MCH: 32.6 pg (ref 28.0–33.4)
MCHC: 35.7 g/dL (ref 32.0–35.9)
MCV: 91.3 fL (ref 81.6–98.0)
MONO#: 0.7 10*3/uL (ref 0.1–0.9)
MONO%: 10.9 % (ref 0.0–13.0)
NEUT#: 4 10*3/uL (ref 1.5–6.5)
NEUT%: 66.5 % (ref 40.0–75.0)
Platelets: 223 10*3/uL (ref 145–400)
RBC: 3.3 10*6/uL — ABNORMAL LOW (ref 4.20–5.71)
RDW: 15.2 % — ABNORMAL HIGH (ref 11.2–14.6)
WBC: 6.1 10*3/uL (ref 4.0–10.0)
lymph#: 1.2 10*3/uL (ref 0.9–3.3)

## 2008-09-22 ENCOUNTER — Encounter: Payer: Self-pay | Admitting: Endocrinology

## 2008-09-25 ENCOUNTER — Telehealth (INDEPENDENT_AMBULATORY_CARE_PROVIDER_SITE_OTHER): Payer: Self-pay | Admitting: *Deleted

## 2008-09-26 ENCOUNTER — Ambulatory Visit: Payer: Self-pay | Admitting: Family Medicine

## 2008-09-26 DIAGNOSIS — IMO0002 Reserved for concepts with insufficient information to code with codable children: Secondary | ICD-10-CM | POA: Insufficient documentation

## 2008-09-26 DIAGNOSIS — E1065 Type 1 diabetes mellitus with hyperglycemia: Secondary | ICD-10-CM | POA: Insufficient documentation

## 2008-09-26 LAB — CONVERTED CEMR LAB
Ketones, urine, test strip: NEGATIVE
Nitrite: NEGATIVE
Urobilinogen, UA: 0.2
WBC Urine, dipstick: NEGATIVE

## 2008-09-28 ENCOUNTER — Ambulatory Visit: Payer: Self-pay | Admitting: Endocrinology

## 2008-10-05 ENCOUNTER — Ambulatory Visit: Payer: Self-pay | Admitting: Cardiovascular Disease

## 2008-10-09 ENCOUNTER — Ambulatory Visit: Payer: Self-pay | Admitting: Oncology

## 2008-10-13 LAB — CBC WITH DIFFERENTIAL/PLATELET
Basophils Absolute: 0 10*3/uL (ref 0.0–0.1)
Eosinophils Absolute: 0.1 10*3/uL (ref 0.0–0.5)
HCT: 30.1 % — ABNORMAL LOW (ref 38.7–49.9)
LYMPH%: 16.3 % (ref 14.0–48.0)
MCV: 92.2 fL (ref 81.6–98.0)
MONO%: 12.6 % (ref 0.0–13.0)
NEUT#: 2.9 10*3/uL (ref 1.5–6.5)
NEUT%: 68.4 % (ref 40.0–75.0)
Platelets: 194 10*3/uL (ref 145–400)
RBC: 3.27 10*6/uL — ABNORMAL LOW (ref 4.20–5.71)

## 2008-10-13 LAB — LACTATE DEHYDROGENASE: LDH: 184 U/L (ref 94–250)

## 2008-10-13 LAB — COMPREHENSIVE METABOLIC PANEL
Alkaline Phosphatase: 25 U/L — ABNORMAL LOW (ref 39–117)
BUN: 49 mg/dL — ABNORMAL HIGH (ref 6–23)
Creatinine, Ser: 2.9 mg/dL — ABNORMAL HIGH (ref 0.40–1.50)
Glucose, Bld: 158 mg/dL — ABNORMAL HIGH (ref 70–99)
Sodium: 139 mEq/L (ref 135–145)
Total Bilirubin: 1.1 mg/dL (ref 0.3–1.2)

## 2008-10-13 LAB — FERRITIN: Ferritin: 449 ng/mL — ABNORMAL HIGH (ref 22–322)

## 2008-10-13 LAB — IRON AND TIBC
%SAT: 30 % (ref 20–55)
Iron: 70 ug/dL (ref 42–165)
TIBC: 234 ug/dL (ref 215–435)
UIBC: 164 ug/dL

## 2008-10-17 ENCOUNTER — Encounter: Admission: RE | Admit: 2008-10-17 | Discharge: 2008-10-17 | Payer: Self-pay | Admitting: Neurology

## 2008-10-19 ENCOUNTER — Ambulatory Visit: Payer: Self-pay | Admitting: Endocrinology

## 2008-10-26 ENCOUNTER — Telehealth: Payer: Self-pay | Admitting: Gastroenterology

## 2008-10-27 ENCOUNTER — Ambulatory Visit: Payer: Self-pay | Admitting: Gastroenterology

## 2008-10-27 ENCOUNTER — Ambulatory Visit: Payer: Self-pay | Admitting: Internal Medicine

## 2008-10-27 ENCOUNTER — Encounter: Admission: RE | Admit: 2008-10-27 | Discharge: 2008-10-27 | Payer: Self-pay | Admitting: Internal Medicine

## 2008-10-27 DIAGNOSIS — K2960 Other gastritis without bleeding: Secondary | ICD-10-CM | POA: Insufficient documentation

## 2008-10-27 LAB — CONVERTED CEMR LAB
AST: 22 units/L (ref 0–37)
Albumin: 3.6 g/dL (ref 3.5–5.2)
Alkaline Phosphatase: 22 units/L — ABNORMAL LOW (ref 39–117)
BUN: 53 mg/dL — ABNORMAL HIGH (ref 6–23)
Creatinine, Ser: 2.8 mg/dL — ABNORMAL HIGH (ref 0.4–1.5)
Eosinophils Absolute: 0.1 10*3/uL (ref 0.0–0.7)
Eosinophils Relative: 3 % (ref 0.0–5.0)
HCT: 28.3 % — ABNORMAL LOW (ref 39.0–52.0)
Hgb A1c MFr Bld: 6.8 % — ABNORMAL HIGH (ref 4.6–6.0)
MCV: 94.3 fL (ref 78.0–100.0)
Monocytes Absolute: 0.7 10*3/uL (ref 0.1–1.0)
Platelets: 249 10*3/uL (ref 150–400)
Potassium: 4 meq/L (ref 3.5–5.1)
WBC: 5 10*3/uL (ref 4.5–10.5)

## 2008-10-30 ENCOUNTER — Ambulatory Visit (HOSPITAL_COMMUNITY): Admission: RE | Admit: 2008-10-30 | Discharge: 2008-10-30 | Payer: Self-pay | Admitting: Gastroenterology

## 2008-11-10 LAB — CBC WITH DIFFERENTIAL/PLATELET
Basophils Absolute: 0.1 10*3/uL (ref 0.0–0.1)
Eosinophils Absolute: 0.2 10*3/uL (ref 0.0–0.5)
HGB: 11 g/dL — ABNORMAL LOW (ref 13.0–17.1)
LYMPH%: 17.8 % (ref 14.0–48.0)
MCV: 87.9 fL (ref 81.6–98.0)
MONO%: 10.7 % (ref 0.0–13.0)
NEUT#: 3.8 10*3/uL (ref 1.5–6.5)
Platelets: 233 10*3/uL (ref 145–400)
RDW: 13.9 % (ref 11.2–14.6)

## 2008-11-24 ENCOUNTER — Ambulatory Visit: Payer: Self-pay | Admitting: Gastroenterology

## 2008-11-24 DIAGNOSIS — K3184 Gastroparesis: Secondary | ICD-10-CM

## 2008-11-30 ENCOUNTER — Ambulatory Visit (HOSPITAL_COMMUNITY): Admission: RE | Admit: 2008-11-30 | Discharge: 2008-11-30 | Payer: Self-pay | Admitting: Gastroenterology

## 2008-12-03 ENCOUNTER — Ambulatory Visit: Payer: Self-pay | Admitting: Oncology

## 2008-12-07 LAB — CBC WITH DIFFERENTIAL/PLATELET
Basophils Absolute: 0.1 10*3/uL (ref 0.0–0.1)
Eosinophils Absolute: 0.1 10*3/uL (ref 0.0–0.5)
HGB: 10.8 g/dL — ABNORMAL LOW (ref 13.0–17.1)
NEUT#: 2.8 10*3/uL (ref 1.5–6.5)
RDW: 15.8 % — ABNORMAL HIGH (ref 11.2–14.6)
lymph#: 0.9 10*3/uL (ref 0.9–3.3)

## 2008-12-08 ENCOUNTER — Telehealth: Payer: Self-pay | Admitting: Gastroenterology

## 2008-12-16 ENCOUNTER — Encounter: Payer: Self-pay | Admitting: Endocrinology

## 2008-12-21 ENCOUNTER — Telehealth: Payer: Self-pay | Admitting: Gastroenterology

## 2008-12-28 ENCOUNTER — Encounter: Payer: Self-pay | Admitting: Endocrinology

## 2009-01-04 ENCOUNTER — Encounter: Payer: Self-pay | Admitting: Gastroenterology

## 2009-01-05 LAB — CBC WITH DIFFERENTIAL/PLATELET
BASO%: 1.4 % (ref 0.0–2.0)
EOS%: 2.9 % (ref 0.0–7.0)
HGB: 11.6 g/dL — ABNORMAL LOW (ref 13.0–17.1)
MCH: 32.1 pg (ref 28.0–33.4)
MCHC: 35.6 g/dL (ref 32.0–35.9)
RDW: 15.9 % — ABNORMAL HIGH (ref 11.2–14.6)
lymph#: 0.9 10*3/uL (ref 0.9–3.3)

## 2009-01-13 ENCOUNTER — Telehealth (INDEPENDENT_AMBULATORY_CARE_PROVIDER_SITE_OTHER): Payer: Self-pay | Admitting: *Deleted

## 2009-01-14 ENCOUNTER — Telehealth: Payer: Self-pay | Admitting: Endocrinology

## 2009-01-18 ENCOUNTER — Ambulatory Visit: Payer: Self-pay | Admitting: Endocrinology

## 2009-01-18 LAB — CONVERTED CEMR LAB: Hgb A1c MFr Bld: 7 % — ABNORMAL HIGH (ref 4.6–6.0)

## 2009-01-29 ENCOUNTER — Ambulatory Visit: Payer: Self-pay | Admitting: Oncology

## 2009-03-02 LAB — CBC WITH DIFFERENTIAL/PLATELET
BASO%: 0.7 % (ref 0.0–2.0)
EOS%: 1.9 % (ref 0.0–7.0)
HCT: 28.8 % — ABNORMAL LOW (ref 38.4–49.9)
LYMPH%: 23.1 % (ref 14.0–49.0)
MCH: 32.2 pg (ref 27.2–33.4)
MCHC: 35.6 g/dL (ref 32.0–36.0)
NEUT%: 60.5 % (ref 39.0–75.0)
RBC: 3.18 10*6/uL — ABNORMAL LOW (ref 4.20–5.82)
lymph#: 1 10*3/uL (ref 0.9–3.3)

## 2009-03-25 ENCOUNTER — Ambulatory Visit: Payer: Self-pay | Admitting: Oncology

## 2009-03-27 DIAGNOSIS — G4733 Obstructive sleep apnea (adult) (pediatric): Secondary | ICD-10-CM | POA: Insufficient documentation

## 2009-03-27 DIAGNOSIS — Z8679 Personal history of other diseases of the circulatory system: Secondary | ICD-10-CM | POA: Insufficient documentation

## 2009-03-27 DIAGNOSIS — G609 Hereditary and idiopathic neuropathy, unspecified: Secondary | ICD-10-CM | POA: Insufficient documentation

## 2009-03-27 DIAGNOSIS — E538 Deficiency of other specified B group vitamins: Secondary | ICD-10-CM

## 2009-03-30 LAB — CBC WITH DIFFERENTIAL/PLATELET
BASO%: 0.9 % (ref 0.0–2.0)
HCT: 29 % — ABNORMAL LOW (ref 38.4–49.9)
LYMPH%: 18.9 % (ref 14.0–49.0)
MCH: 31.6 pg (ref 27.2–33.4)
MCHC: 35.1 g/dL (ref 32.0–36.0)
MCV: 89.8 fL (ref 79.3–98.0)
MONO#: 0.5 10*3/uL (ref 0.1–0.9)
MONO%: 12.1 % (ref 0.0–14.0)
NEUT%: 66.5 % (ref 39.0–75.0)
Platelets: 176 10*3/uL (ref 140–400)

## 2009-04-07 ENCOUNTER — Encounter: Payer: Self-pay | Admitting: Endocrinology

## 2009-04-14 ENCOUNTER — Emergency Department (HOSPITAL_COMMUNITY): Admission: EM | Admit: 2009-04-14 | Discharge: 2009-04-14 | Payer: Self-pay | Admitting: Emergency Medicine

## 2009-04-19 ENCOUNTER — Ambulatory Visit: Payer: Self-pay | Admitting: Endocrinology

## 2009-04-19 LAB — CONVERTED CEMR LAB: Hgb A1c MFr Bld: 7.2 % — ABNORMAL HIGH (ref 4.6–6.5)

## 2009-05-19 ENCOUNTER — Ambulatory Visit: Payer: Self-pay | Admitting: Oncology

## 2009-05-21 LAB — COMPREHENSIVE METABOLIC PANEL
Albumin: 4 g/dL (ref 3.5–5.2)
BUN: 44 mg/dL — ABNORMAL HIGH (ref 6–23)
Calcium: 8.8 mg/dL (ref 8.4–10.5)
Chloride: 103 mEq/L (ref 96–112)
Creatinine, Ser: 2.9 mg/dL — ABNORMAL HIGH (ref 0.40–1.50)
Glucose, Bld: 116 mg/dL — ABNORMAL HIGH (ref 70–99)
Potassium: 4.1 mEq/L (ref 3.5–5.3)

## 2009-05-21 LAB — CBC WITH DIFFERENTIAL/PLATELET
Basophils Absolute: 0 10*3/uL (ref 0.0–0.1)
Eosinophils Absolute: 0.1 10*3/uL (ref 0.0–0.5)
HCT: 29.7 % — ABNORMAL LOW (ref 38.4–49.9)
HGB: 10.3 g/dL — ABNORMAL LOW (ref 13.0–17.1)
MCH: 30.4 pg (ref 27.2–33.4)
MCV: 87.6 fL (ref 79.3–98.0)
NEUT#: 2.5 10*3/uL (ref 1.5–6.5)
NEUT%: 61.8 % (ref 39.0–75.0)
RDW: 14.6 % (ref 11.0–14.6)
lymph#: 0.8 10*3/uL — ABNORMAL LOW (ref 0.9–3.3)

## 2009-05-21 LAB — LACTATE DEHYDROGENASE: LDH: 180 U/L (ref 94–250)

## 2009-05-21 LAB — IRON AND TIBC
Iron: 54 ug/dL (ref 42–165)
UIBC: 196 ug/dL

## 2009-05-21 LAB — FERRITIN: Ferritin: 159 ng/mL (ref 22–322)

## 2009-05-28 ENCOUNTER — Encounter (INDEPENDENT_AMBULATORY_CARE_PROVIDER_SITE_OTHER): Payer: Self-pay | Admitting: *Deleted

## 2009-05-31 ENCOUNTER — Telehealth: Payer: Self-pay | Admitting: Cardiovascular Disease

## 2009-06-18 LAB — CBC WITH DIFFERENTIAL/PLATELET
BASO%: 1.2 % (ref 0.0–2.0)
Basophils Absolute: 0 10*3/uL (ref 0.0–0.1)
EOS%: 1.3 % (ref 0.0–7.0)
HCT: 31.3 % — ABNORMAL LOW (ref 38.4–49.9)
HGB: 11.1 g/dL — ABNORMAL LOW (ref 13.0–17.1)
MCH: 31.4 pg (ref 27.2–33.4)
MONO#: 0.4 10*3/uL (ref 0.1–0.9)
NEUT%: 66.5 % (ref 39.0–75.0)
RDW: 15.9 % — ABNORMAL HIGH (ref 11.0–14.6)
WBC: 3.8 10*3/uL — ABNORMAL LOW (ref 4.0–10.3)
lymph#: 0.7 10*3/uL — ABNORMAL LOW (ref 0.9–3.3)

## 2009-06-25 ENCOUNTER — Telehealth: Payer: Self-pay | Admitting: Cardiovascular Disease

## 2009-07-05 ENCOUNTER — Telehealth: Payer: Self-pay | Admitting: Endocrinology

## 2009-07-14 ENCOUNTER — Ambulatory Visit: Payer: Self-pay | Admitting: Oncology

## 2009-07-16 ENCOUNTER — Encounter: Payer: Self-pay | Admitting: Endocrinology

## 2009-07-16 LAB — CBC WITH DIFFERENTIAL/PLATELET
BASO%: 1 % (ref 0.0–2.0)
EOS%: 1.4 % (ref 0.0–7.0)
HCT: 30.4 % — ABNORMAL LOW (ref 38.4–49.9)
MCH: 31.2 pg (ref 27.2–33.4)
MCHC: 35 g/dL (ref 32.0–36.0)
MONO#: 0.4 10*3/uL (ref 0.1–0.9)
NEUT%: 70.8 % (ref 39.0–75.0)
RBC: 3.41 10*6/uL — ABNORMAL LOW (ref 4.20–5.82)
RDW: 16.7 % — ABNORMAL HIGH (ref 11.0–14.6)
WBC: 4.1 10*3/uL (ref 4.0–10.3)
lymph#: 0.7 10*3/uL — ABNORMAL LOW (ref 0.9–3.3)

## 2009-07-19 ENCOUNTER — Ambulatory Visit (HOSPITAL_COMMUNITY): Admission: RE | Admit: 2009-07-19 | Discharge: 2009-07-19 | Payer: Self-pay | Admitting: Neurology

## 2009-07-28 ENCOUNTER — Ambulatory Visit: Payer: Self-pay | Admitting: Cardiovascular Disease

## 2009-07-28 ENCOUNTER — Ambulatory Visit: Payer: Self-pay | Admitting: Endocrinology

## 2009-07-28 DIAGNOSIS — R0989 Other specified symptoms and signs involving the circulatory and respiratory systems: Secondary | ICD-10-CM

## 2009-07-28 LAB — CONVERTED CEMR LAB: Hgb A1c MFr Bld: 7.5 % — ABNORMAL HIGH (ref 4.6–6.5)

## 2009-08-04 ENCOUNTER — Telehealth (INDEPENDENT_AMBULATORY_CARE_PROVIDER_SITE_OTHER): Payer: Self-pay | Admitting: *Deleted

## 2009-08-05 ENCOUNTER — Ambulatory Visit: Payer: Self-pay

## 2009-08-05 ENCOUNTER — Encounter: Payer: Self-pay | Admitting: Cardiovascular Disease

## 2009-08-09 ENCOUNTER — Telehealth: Payer: Self-pay | Admitting: Cardiovascular Disease

## 2009-08-10 ENCOUNTER — Encounter: Payer: Self-pay | Admitting: Endocrinology

## 2009-08-13 ENCOUNTER — Ambulatory Visit: Payer: Self-pay | Admitting: Oncology

## 2009-08-13 LAB — CBC WITH DIFFERENTIAL/PLATELET
BASO%: 0.8 % (ref 0.0–2.0)
EOS%: 0.8 % (ref 0.0–7.0)
HCT: 30.5 % — ABNORMAL LOW (ref 38.4–49.9)
LYMPH%: 16.3 % (ref 14.0–49.0)
MCH: 31.4 pg (ref 27.2–33.4)
MCHC: 35.3 g/dL (ref 32.0–36.0)
MONO#: 0.6 10*3/uL (ref 0.1–0.9)
MONO%: 10.9 % (ref 0.0–14.0)
NEUT%: 71.2 % (ref 39.0–75.0)
Platelets: 177 10*3/uL (ref 140–400)
RBC: 3.42 10*6/uL — ABNORMAL LOW (ref 4.20–5.82)
WBC: 5.2 10*3/uL (ref 4.0–10.3)

## 2009-08-31 LAB — FECAL OCCULT BLOOD, GUAIAC: Occult Blood: POSITIVE

## 2009-09-06 ENCOUNTER — Encounter: Admission: RE | Admit: 2009-09-06 | Discharge: 2009-09-22 | Payer: Self-pay | Admitting: Neurology

## 2009-09-13 ENCOUNTER — Ambulatory Visit: Payer: Self-pay | Admitting: Oncology

## 2009-09-13 LAB — CBC WITH DIFFERENTIAL/PLATELET
BASO%: 1 % (ref 0.0–2.0)
Eosinophils Absolute: 0.1 10*3/uL (ref 0.0–0.5)
HCT: 31.3 % — ABNORMAL LOW (ref 38.4–49.9)
HGB: 11 g/dL — ABNORMAL LOW (ref 13.0–17.1)
MCHC: 35.2 g/dL (ref 32.0–36.0)
MONO#: 0.5 10*3/uL (ref 0.1–0.9)
NEUT#: 3.2 10*3/uL (ref 1.5–6.5)
NEUT%: 69.4 % (ref 39.0–75.0)
Platelets: 190 10*3/uL (ref 140–400)
WBC: 4.6 10*3/uL (ref 4.0–10.3)
lymph#: 0.8 10*3/uL — ABNORMAL LOW (ref 0.9–3.3)

## 2009-09-15 ENCOUNTER — Ambulatory Visit: Payer: Self-pay | Admitting: Cardiology

## 2009-09-15 ENCOUNTER — Inpatient Hospital Stay (HOSPITAL_COMMUNITY): Admission: RE | Admit: 2009-09-15 | Discharge: 2009-09-24 | Payer: Self-pay | Admitting: Neurosurgery

## 2009-09-16 LAB — COMPREHENSIVE METABOLIC PANEL
ALT: 15 U/L (ref 0–53)
CO2: 22 mEq/L (ref 19–32)
Calcium: 9.1 mg/dL (ref 8.4–10.5)
Chloride: 100 mEq/L (ref 96–112)
Creatinine, Ser: 3.04 mg/dL — ABNORMAL HIGH (ref 0.40–1.50)
Glucose, Bld: 336 mg/dL — ABNORMAL HIGH (ref 70–99)
Total Protein: 7.6 g/dL (ref 6.0–8.3)

## 2009-09-16 LAB — LACTATE DEHYDROGENASE: LDH: 201 U/L (ref 94–250)

## 2009-09-16 LAB — FERRITIN: Ferritin: 181 ng/mL (ref 22–322)

## 2009-09-16 LAB — TRANSFERRIN RECEPTOR, SOLUABLE: Transferrin Receptor, Soluble: 23.7 nmol/L

## 2009-09-16 LAB — IRON AND TIBC: UIBC: 159 ug/dL

## 2009-09-17 ENCOUNTER — Encounter: Payer: Self-pay | Admitting: Cardiology

## 2009-09-19 ENCOUNTER — Encounter: Payer: Self-pay | Admitting: Cardiology

## 2009-09-21 ENCOUNTER — Encounter (INDEPENDENT_AMBULATORY_CARE_PROVIDER_SITE_OTHER): Payer: Self-pay | Admitting: Neurology

## 2009-09-24 ENCOUNTER — Encounter: Payer: Self-pay | Admitting: Internal Medicine

## 2009-09-28 ENCOUNTER — Emergency Department (HOSPITAL_COMMUNITY): Admission: EM | Admit: 2009-09-28 | Discharge: 2009-09-28 | Payer: Self-pay | Admitting: Emergency Medicine

## 2009-09-30 ENCOUNTER — Ambulatory Visit: Payer: Self-pay | Admitting: Internal Medicine

## 2009-10-03 ENCOUNTER — Ambulatory Visit: Payer: Self-pay | Admitting: Gastroenterology

## 2009-10-03 ENCOUNTER — Inpatient Hospital Stay (HOSPITAL_COMMUNITY): Admission: EM | Admit: 2009-10-03 | Discharge: 2009-10-14 | Payer: Self-pay | Admitting: Emergency Medicine

## 2009-10-04 ENCOUNTER — Ambulatory Visit: Payer: Self-pay | Admitting: Internal Medicine

## 2009-10-05 ENCOUNTER — Telehealth: Payer: Self-pay | Admitting: Endocrinology

## 2009-10-05 ENCOUNTER — Telehealth (INDEPENDENT_AMBULATORY_CARE_PROVIDER_SITE_OTHER): Payer: Self-pay | Admitting: *Deleted

## 2009-10-07 ENCOUNTER — Encounter (INDEPENDENT_AMBULATORY_CARE_PROVIDER_SITE_OTHER): Payer: Self-pay | Admitting: Internal Medicine

## 2009-10-08 ENCOUNTER — Ambulatory Visit: Payer: Self-pay | Admitting: Vascular Surgery

## 2009-10-19 ENCOUNTER — Encounter: Admission: RE | Admit: 2009-10-19 | Discharge: 2009-10-19 | Payer: Self-pay | Admitting: Neurosurgery

## 2009-10-21 ENCOUNTER — Ambulatory Visit: Payer: Self-pay | Admitting: Endocrinology

## 2009-10-21 DIAGNOSIS — N186 End stage renal disease: Secondary | ICD-10-CM

## 2009-10-28 ENCOUNTER — Ambulatory Visit: Payer: Self-pay | Admitting: Internal Medicine

## 2009-11-03 ENCOUNTER — Ambulatory Visit: Payer: Self-pay | Admitting: Oncology

## 2009-11-04 ENCOUNTER — Telehealth (INDEPENDENT_AMBULATORY_CARE_PROVIDER_SITE_OTHER): Payer: Self-pay | Admitting: *Deleted

## 2009-11-04 ENCOUNTER — Ambulatory Visit: Payer: Self-pay | Admitting: Endocrinology

## 2009-11-06 ENCOUNTER — Encounter: Payer: Self-pay | Admitting: Endocrinology

## 2009-11-09 ENCOUNTER — Ambulatory Visit: Payer: Self-pay | Admitting: Cardiovascular Disease

## 2009-11-11 ENCOUNTER — Encounter: Payer: Self-pay | Admitting: Endocrinology

## 2009-11-15 ENCOUNTER — Telehealth (INDEPENDENT_AMBULATORY_CARE_PROVIDER_SITE_OTHER): Payer: Self-pay | Admitting: *Deleted

## 2009-11-23 ENCOUNTER — Ambulatory Visit: Payer: Self-pay | Admitting: Endocrinology

## 2009-11-25 ENCOUNTER — Ambulatory Visit: Payer: Self-pay | Admitting: Vascular Surgery

## 2009-11-25 ENCOUNTER — Ambulatory Visit: Payer: Self-pay | Admitting: Gastroenterology

## 2009-12-07 ENCOUNTER — Encounter: Admission: RE | Admit: 2009-12-07 | Discharge: 2009-12-07 | Payer: Self-pay | Admitting: Neurosurgery

## 2009-12-14 ENCOUNTER — Ambulatory Visit: Payer: Self-pay | Admitting: Endocrinology

## 2009-12-28 ENCOUNTER — Ambulatory Visit: Payer: Self-pay | Admitting: Endocrinology

## 2009-12-31 ENCOUNTER — Encounter: Payer: Self-pay | Admitting: Internal Medicine

## 2009-12-31 ENCOUNTER — Ambulatory Visit: Payer: Self-pay | Admitting: Cardiology

## 2009-12-31 ENCOUNTER — Inpatient Hospital Stay (HOSPITAL_COMMUNITY): Admission: EM | Admit: 2009-12-31 | Discharge: 2010-01-02 | Payer: Self-pay | Admitting: Emergency Medicine

## 2009-12-31 LAB — CONVERTED CEMR LAB
Basophils Relative: 1 %
Calcium: 9.5 mg/dL
Eosinophils Relative: 1 %
Glucose, Bld: 188 mg/dL
HCT: 34.3 %
Hemoglobin: 11.8 g/dL
RBC: 3.75 M/uL
WBC: 8 10*3/uL

## 2010-01-01 ENCOUNTER — Encounter: Payer: Self-pay | Admitting: Internal Medicine

## 2010-01-01 LAB — CONVERTED CEMR LAB
BUN: 39 mg/dL
CO2: 28 meq/L
Chloride: 96 meq/L
Creatinine, Ser: 4.12 mg/dL
HCT: 39.8 %
Hemoglobin: 13.7 g/dL
Sodium: 138 meq/L

## 2010-01-03 ENCOUNTER — Telehealth (INDEPENDENT_AMBULATORY_CARE_PROVIDER_SITE_OTHER): Payer: Self-pay | Admitting: *Deleted

## 2010-01-03 ENCOUNTER — Encounter: Payer: Self-pay | Admitting: Internal Medicine

## 2010-01-03 DIAGNOSIS — Z992 Dependence on renal dialysis: Secondary | ICD-10-CM | POA: Insufficient documentation

## 2010-01-04 ENCOUNTER — Encounter: Payer: Self-pay | Admitting: Internal Medicine

## 2010-01-06 ENCOUNTER — Ambulatory Visit (HOSPITAL_COMMUNITY): Admission: RE | Admit: 2010-01-06 | Discharge: 2010-01-06 | Payer: Self-pay | Admitting: Nephrology

## 2010-01-10 ENCOUNTER — Telehealth: Payer: Self-pay | Admitting: Cardiovascular Disease

## 2010-01-18 ENCOUNTER — Ambulatory Visit: Payer: Self-pay | Admitting: Endocrinology

## 2010-01-18 ENCOUNTER — Ambulatory Visit: Payer: Self-pay | Admitting: Internal Medicine

## 2010-01-18 DIAGNOSIS — C44309 Unspecified malignant neoplasm of skin of other parts of face: Secondary | ICD-10-CM | POA: Insufficient documentation

## 2010-01-18 DIAGNOSIS — C44300 Unspecified malignant neoplasm of skin of unspecified part of face: Secondary | ICD-10-CM | POA: Insufficient documentation

## 2010-01-19 ENCOUNTER — Encounter: Payer: Self-pay | Admitting: Endocrinology

## 2010-01-19 ENCOUNTER — Telehealth (INDEPENDENT_AMBULATORY_CARE_PROVIDER_SITE_OTHER): Payer: Self-pay | Admitting: *Deleted

## 2010-01-22 ENCOUNTER — Ambulatory Visit: Payer: Medicare Other

## 2010-01-24 ENCOUNTER — Encounter: Payer: Self-pay | Admitting: Endocrinology

## 2010-01-24 ENCOUNTER — Telehealth: Payer: Self-pay | Admitting: Gastroenterology

## 2010-01-25 ENCOUNTER — Encounter: Payer: Self-pay | Admitting: Gastroenterology

## 2010-01-26 ENCOUNTER — Encounter: Payer: Self-pay | Admitting: Internal Medicine

## 2010-02-01 ENCOUNTER — Ambulatory Visit: Payer: Self-pay | Admitting: Cardiovascular Disease

## 2010-02-01 DIAGNOSIS — R000 Tachycardia, unspecified: Secondary | ICD-10-CM

## 2010-02-04 ENCOUNTER — Telehealth: Payer: Self-pay | Admitting: Internal Medicine

## 2010-02-10 ENCOUNTER — Encounter: Payer: Self-pay | Admitting: Endocrinology

## 2010-02-15 ENCOUNTER — Telehealth: Payer: Self-pay | Admitting: Endocrinology

## 2010-02-25 ENCOUNTER — Encounter: Payer: Self-pay | Admitting: Endocrinology

## 2010-03-01 ENCOUNTER — Encounter: Payer: Self-pay | Admitting: Internal Medicine

## 2010-03-03 ENCOUNTER — Ambulatory Visit: Payer: Self-pay | Admitting: Cardiovascular Disease

## 2010-03-03 ENCOUNTER — Encounter: Payer: Self-pay | Admitting: Cardiovascular Disease

## 2010-03-03 ENCOUNTER — Ambulatory Visit: Payer: Self-pay

## 2010-03-03 ENCOUNTER — Ambulatory Visit (HOSPITAL_COMMUNITY): Admission: RE | Admit: 2010-03-03 | Discharge: 2010-03-03 | Payer: Self-pay | Admitting: Cardiovascular Disease

## 2010-03-17 ENCOUNTER — Encounter: Admission: RE | Admit: 2010-03-17 | Discharge: 2010-03-17 | Payer: Self-pay | Admitting: Neurosurgery

## 2010-03-18 ENCOUNTER — Encounter: Payer: Self-pay | Admitting: Endocrinology

## 2010-03-24 ENCOUNTER — Ambulatory Visit: Payer: Self-pay | Admitting: Internal Medicine

## 2010-03-24 ENCOUNTER — Encounter: Payer: Self-pay | Admitting: Cardiovascular Disease

## 2010-03-24 ENCOUNTER — Encounter: Admission: RE | Admit: 2010-03-24 | Discharge: 2010-03-24 | Payer: Self-pay | Admitting: Neurosurgery

## 2010-03-31 DIAGNOSIS — I5022 Chronic systolic (congestive) heart failure: Secondary | ICD-10-CM

## 2010-04-01 ENCOUNTER — Telehealth: Payer: Self-pay | Admitting: Cardiovascular Disease

## 2010-04-04 ENCOUNTER — Ambulatory Visit: Payer: Self-pay | Admitting: Internal Medicine

## 2010-04-21 ENCOUNTER — Encounter: Admission: RE | Admit: 2010-04-21 | Discharge: 2010-04-21 | Payer: Self-pay | Admitting: Neurosurgery

## 2010-04-26 ENCOUNTER — Ambulatory Visit: Payer: Self-pay | Admitting: Internal Medicine

## 2010-04-26 ENCOUNTER — Ambulatory Visit: Payer: Self-pay | Admitting: Endocrinology

## 2010-05-17 ENCOUNTER — Emergency Department: Payer: Medicare Other | Admitting: Emergency Medicine

## 2010-05-17 ENCOUNTER — Inpatient Hospital Stay (HOSPITAL_COMMUNITY): Admission: RE | Admit: 2010-05-17 | Discharge: 2010-05-19 | Payer: Self-pay | Admitting: Cardiovascular Disease

## 2010-05-17 ENCOUNTER — Ambulatory Visit: Payer: Self-pay | Admitting: Cardiovascular Disease

## 2010-06-08 ENCOUNTER — Encounter: Payer: Self-pay | Admitting: Endocrinology

## 2010-06-11 ENCOUNTER — Encounter: Payer: Self-pay | Admitting: Endocrinology

## 2010-06-14 ENCOUNTER — Encounter: Payer: Self-pay | Admitting: Endocrinology

## 2010-06-30 ENCOUNTER — Encounter: Admission: RE | Admit: 2010-06-30 | Discharge: 2010-06-30 | Payer: Self-pay | Admitting: Neurosurgery

## 2010-07-07 ENCOUNTER — Ambulatory Visit: Payer: Self-pay | Admitting: Cardiovascular Disease

## 2010-07-25 ENCOUNTER — Encounter: Payer: Self-pay | Admitting: Cardiovascular Disease

## 2010-07-26 ENCOUNTER — Ambulatory Visit: Payer: Self-pay | Admitting: Endocrinology

## 2010-07-26 ENCOUNTER — Ambulatory Visit: Payer: Self-pay

## 2010-07-26 ENCOUNTER — Ambulatory Visit: Payer: Self-pay | Admitting: Internal Medicine

## 2010-07-26 ENCOUNTER — Ambulatory Visit: Payer: Self-pay | Admitting: Cardiovascular Disease

## 2010-07-26 LAB — CONVERTED CEMR LAB
Bilirubin, Direct: 0.1 mg/dL (ref 0.0–0.3)
Direct LDL: 68.2 mg/dL
HDL: 31.7 mg/dL — ABNORMAL LOW (ref >39.00)
Hgb A1c MFr Bld: 6.3 % (ref 4.6–6.5)
Total Protein: 7.4 g/dL (ref 6.0–8.3)
Triglycerides: 527 mg/dL — ABNORMAL HIGH (ref 0.0–149.0)
VLDL: 105.4 mg/dL — ABNORMAL HIGH (ref 0.0–40.0)

## 2010-07-27 ENCOUNTER — Ambulatory Visit: Payer: Medicare Other | Admitting: Vascular Surgery

## 2010-08-02 ENCOUNTER — Encounter: Payer: Self-pay | Admitting: Cardiovascular Disease

## 2010-08-02 ENCOUNTER — Encounter: Payer: Self-pay | Admitting: Internal Medicine

## 2010-08-14 LAB — CONVERTED CEMR LAB
Eosinophils Relative: 0 %
HCT: 26.3 %
Neutrophils Relative %: 75 %
Platelets: 162 10*3/uL
RBC: 2.49 M/uL
RDW: 17.1 %

## 2010-08-15 ENCOUNTER — Inpatient Hospital Stay (HOSPITAL_COMMUNITY): Admission: EM | Admit: 2010-08-15 | Discharge: 2010-08-17 | Payer: Self-pay | Admitting: Emergency Medicine

## 2010-08-15 ENCOUNTER — Ambulatory Visit: Payer: Self-pay | Admitting: Cardiology

## 2010-08-15 LAB — CONVERTED CEMR LAB
BUN: 11 mg/dL
Creatinine, Ser: 4.09 mg/dL
Glucose, Bld: 214 mg/dL
Potassium: 3.9 meq/L
Sodium: 135 meq/L
TSH: 1.296 microintl units/mL

## 2010-08-16 LAB — CONVERTED CEMR LAB
CO2: 33 meq/L
Chloride: 96 meq/L
Creatinine, Ser: 5.06 mg/dL
Potassium: 4 meq/L
RDW: 16.6 %
Sodium: 139 meq/L
WBC: 5.8 10*3/uL

## 2010-08-17 ENCOUNTER — Encounter: Payer: Self-pay | Admitting: Internal Medicine

## 2010-08-17 LAB — CONVERTED CEMR LAB
Albumin: 2.9 g/dL
BUN: 37 mg/dL
Chloride: 98 meq/L
Creatinine, Ser: 7.05 mg/dL
Glucose, Bld: 176 mg/dL
Hemoglobin: 8.9 g/dL
Platelets: 123 10*3/uL
Potassium: 4 meq/L
Sodium: 135 meq/L
WBC: 4.4 10*3/uL

## 2010-08-23 ENCOUNTER — Ambulatory Visit: Payer: Self-pay | Admitting: Internal Medicine

## 2010-08-30 ENCOUNTER — Telehealth: Payer: Self-pay | Admitting: Internal Medicine

## 2010-09-01 ENCOUNTER — Encounter: Payer: Self-pay | Admitting: Internal Medicine

## 2010-09-01 ENCOUNTER — Ambulatory Visit: Payer: Self-pay

## 2010-09-14 ENCOUNTER — Encounter: Payer: Self-pay | Admitting: Gastroenterology

## 2010-10-11 IMAGING — CR DG CHEST 1V PORT
1 series · 1 of 1 positions shown · non-contrast
Comparison: Chest 08/14/2010.

CLINICAL DATA: Apnea, bradycardia.

PORTABLE CHEST - 1 VIEW

[AP]
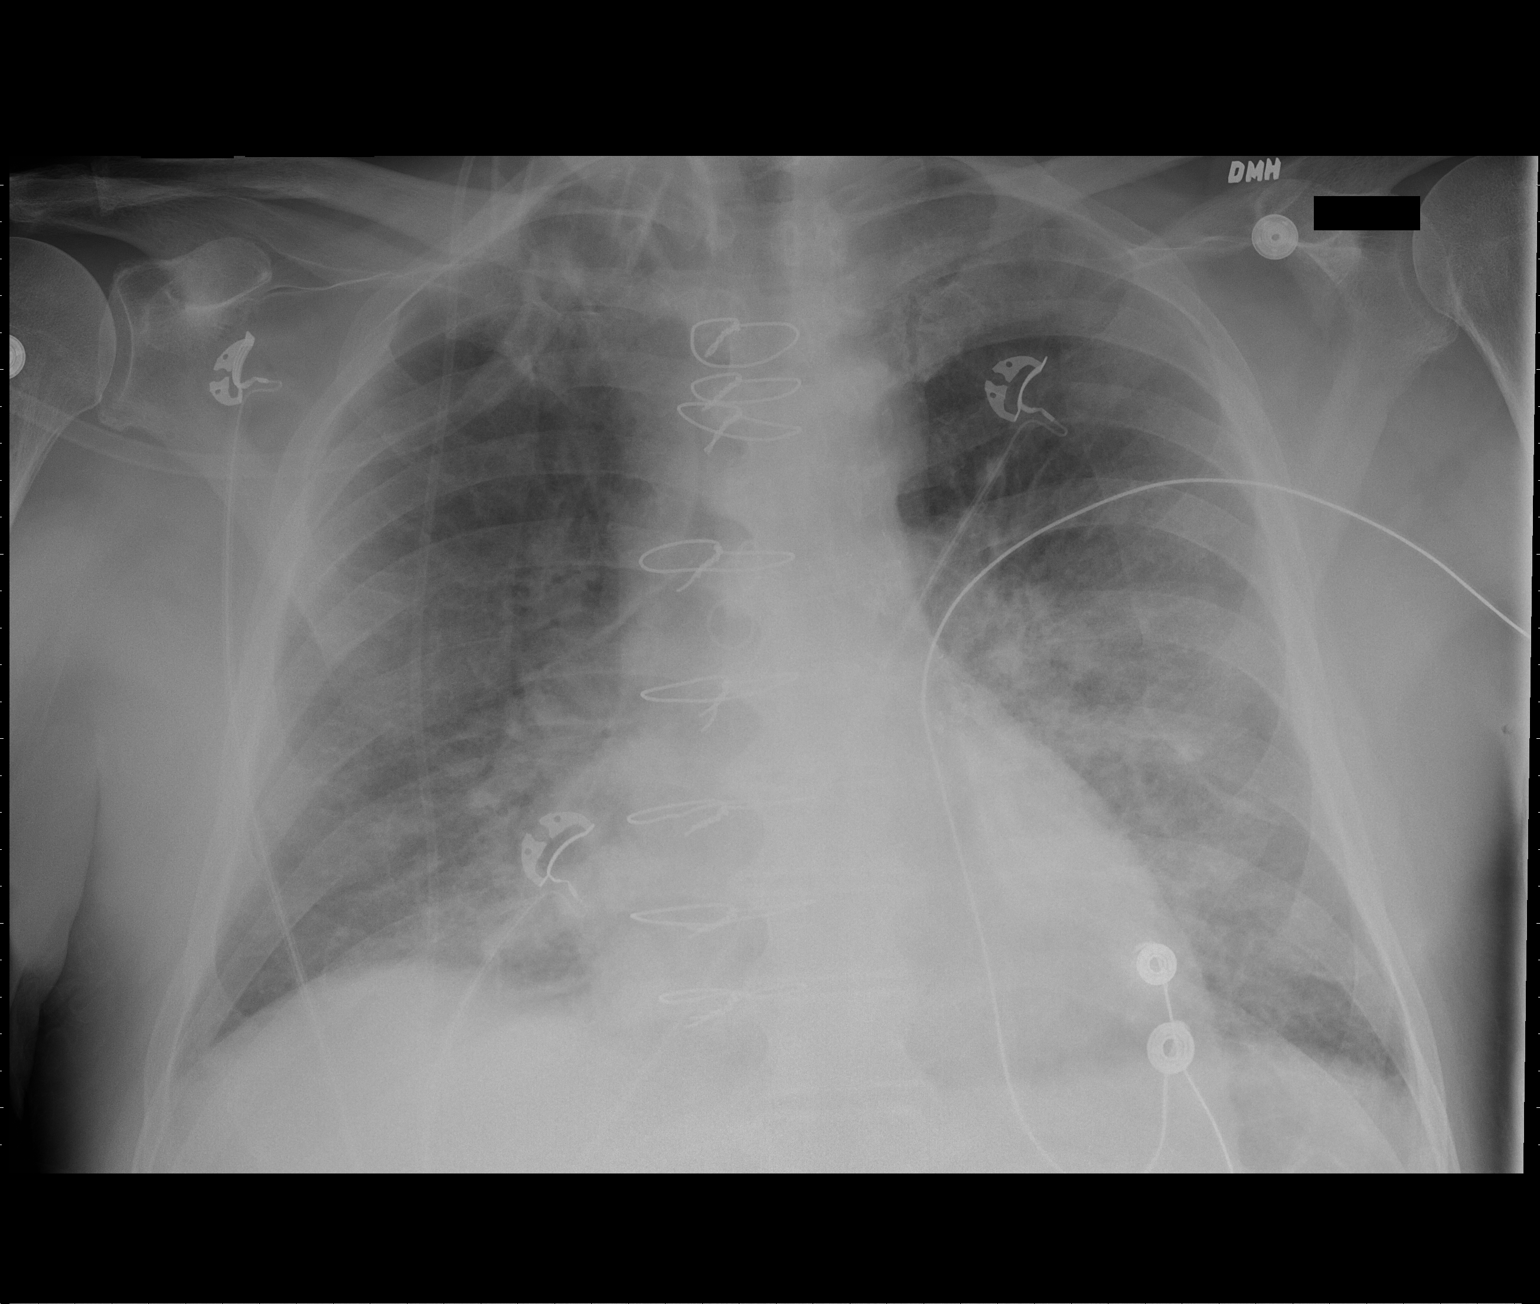

[1 of 1 positions shown; findings below may reference images not displayed]

FINDINGS: The patient has bilateral airspace disease most
consistent with pulmonary edema.  No effusion.  Heart size upper
normal.
IMPRESSION: Bilateral airspace disease most consistent with pulmonary edema.

## 2010-10-12 IMAGING — CR DG CHEST 2V
1 series · 1 of 1 positions shown · non-contrast
Comparison: 08/15/2010 and earlier.

CLINICAL DATA: 77-year-old male with bradycardia.

CHEST - 2 VIEW

[w chest lat]
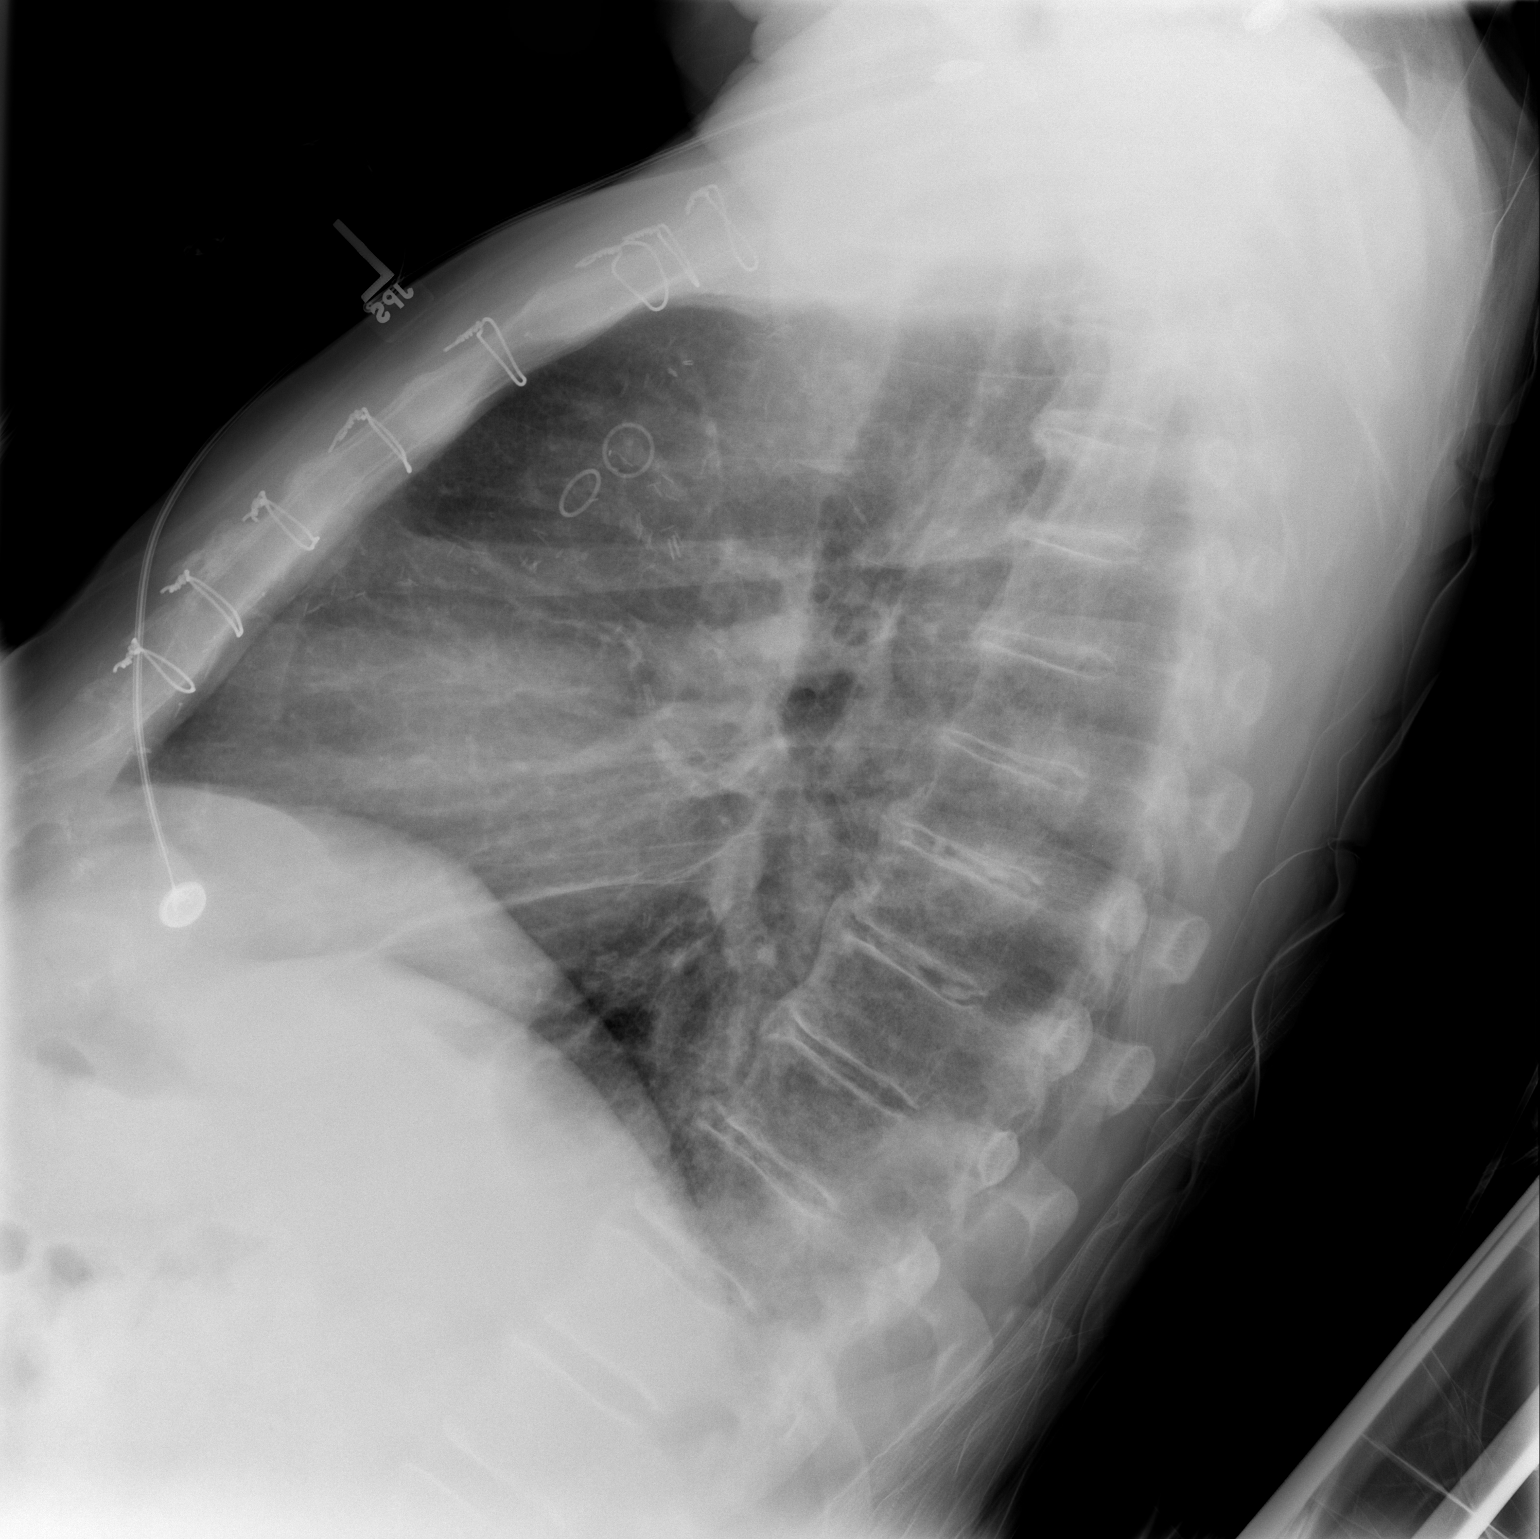

[1 of 1 positions shown; findings below may reference images not displayed]

FINDINGS: Semi upright AP and lateral views of the chest.  Interval
significantly improved ventilation and decreased diffuse
interstitial opacity.  Stable sequelae of CABG.  Stable cardiac
size and mediastinal contours.  No pneumothorax, pleural effusion
or consolidation.  Residual lower lobe atelectasis. No acute
osseous abnormality identified.
IMPRESSION: Interval resolved pulmonary edema.  Residual lower lobe
atelectasis.

## 2010-10-13 IMAGING — CR DG CHEST 2V
2 series · 2 of 2 positions shown · non-contrast
Comparison: 08/16/2010

CLINICAL DATA: AICD placement (status post insertion).
Bradycardia.  Renal disease.

CHEST - 2 VIEW

[w chest pa]
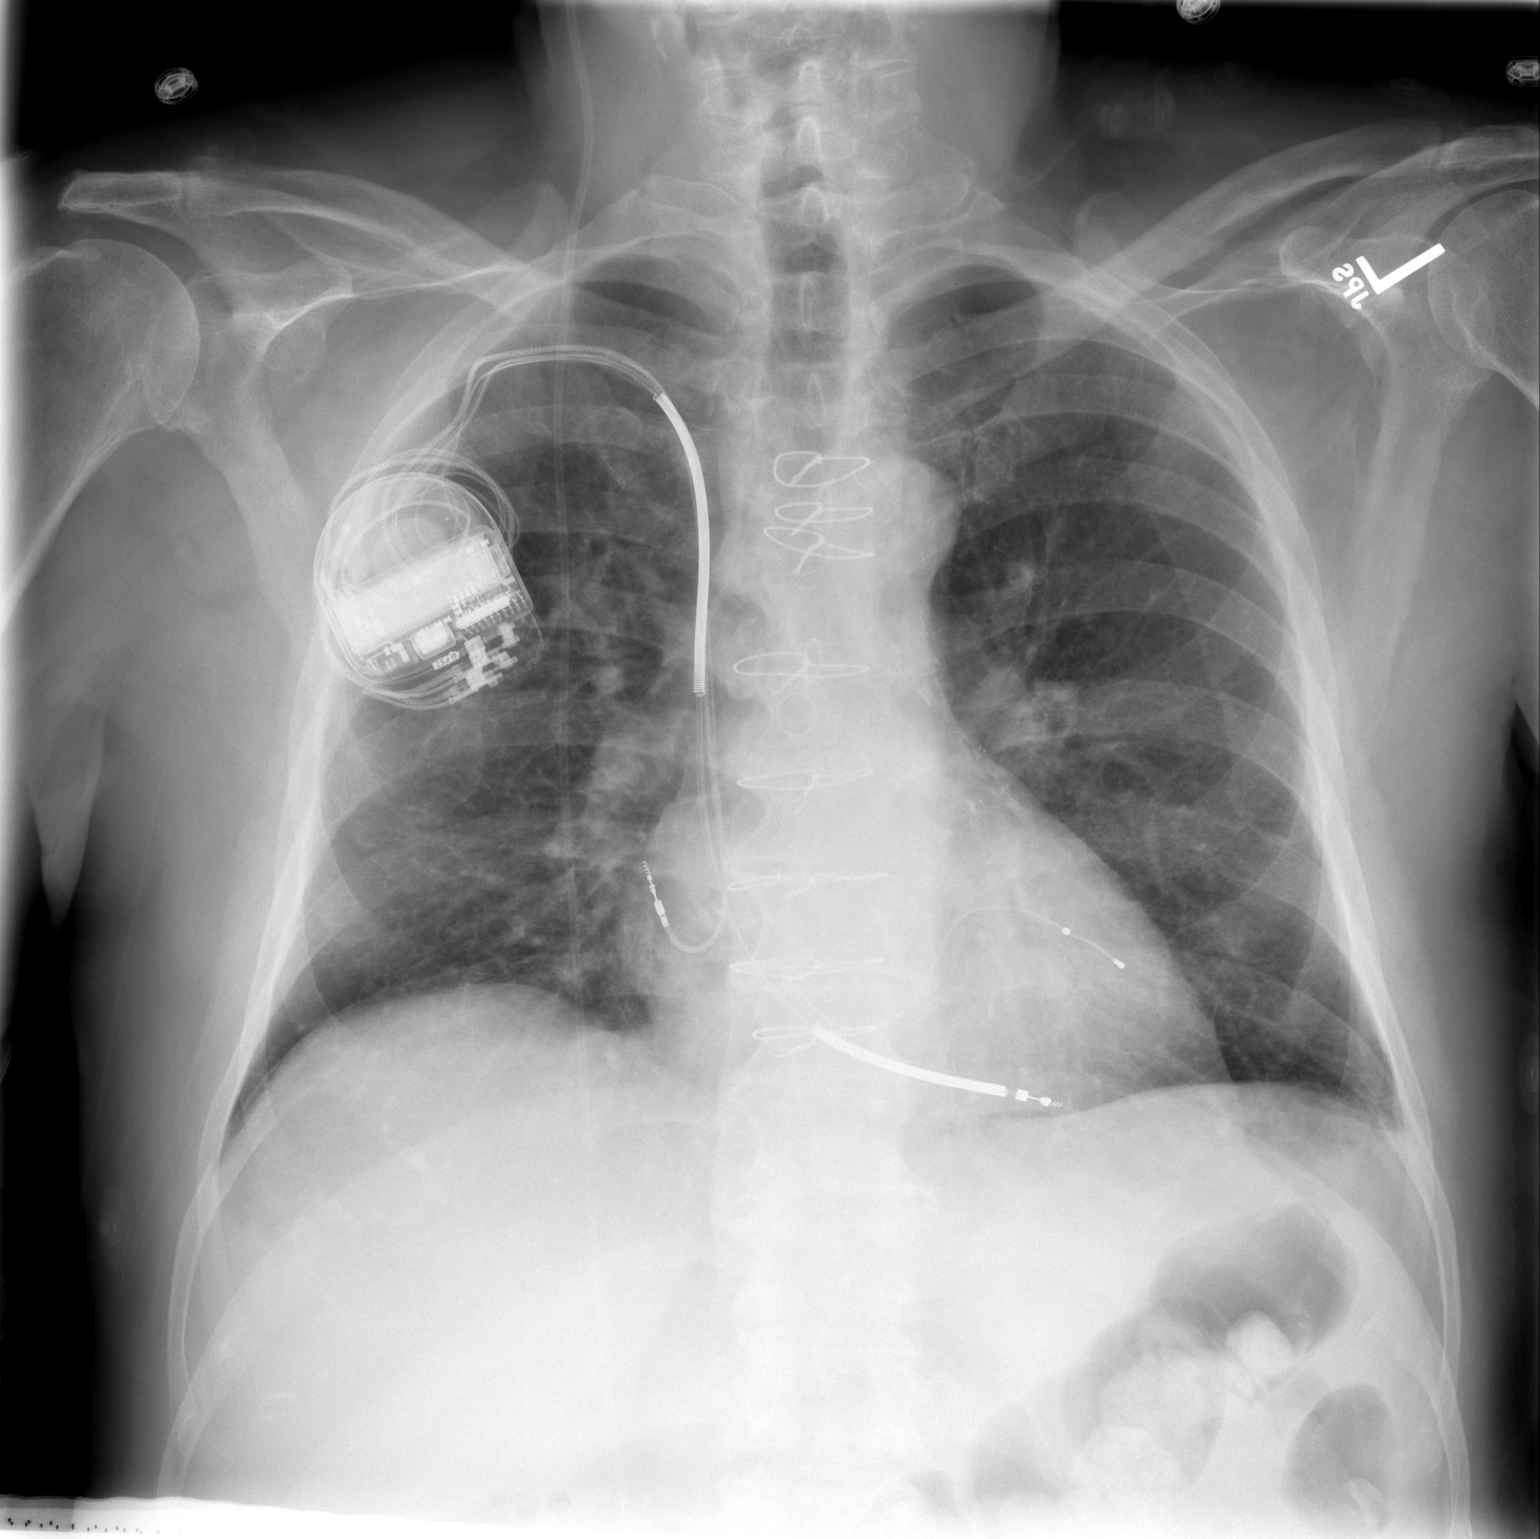

[w chest lat]
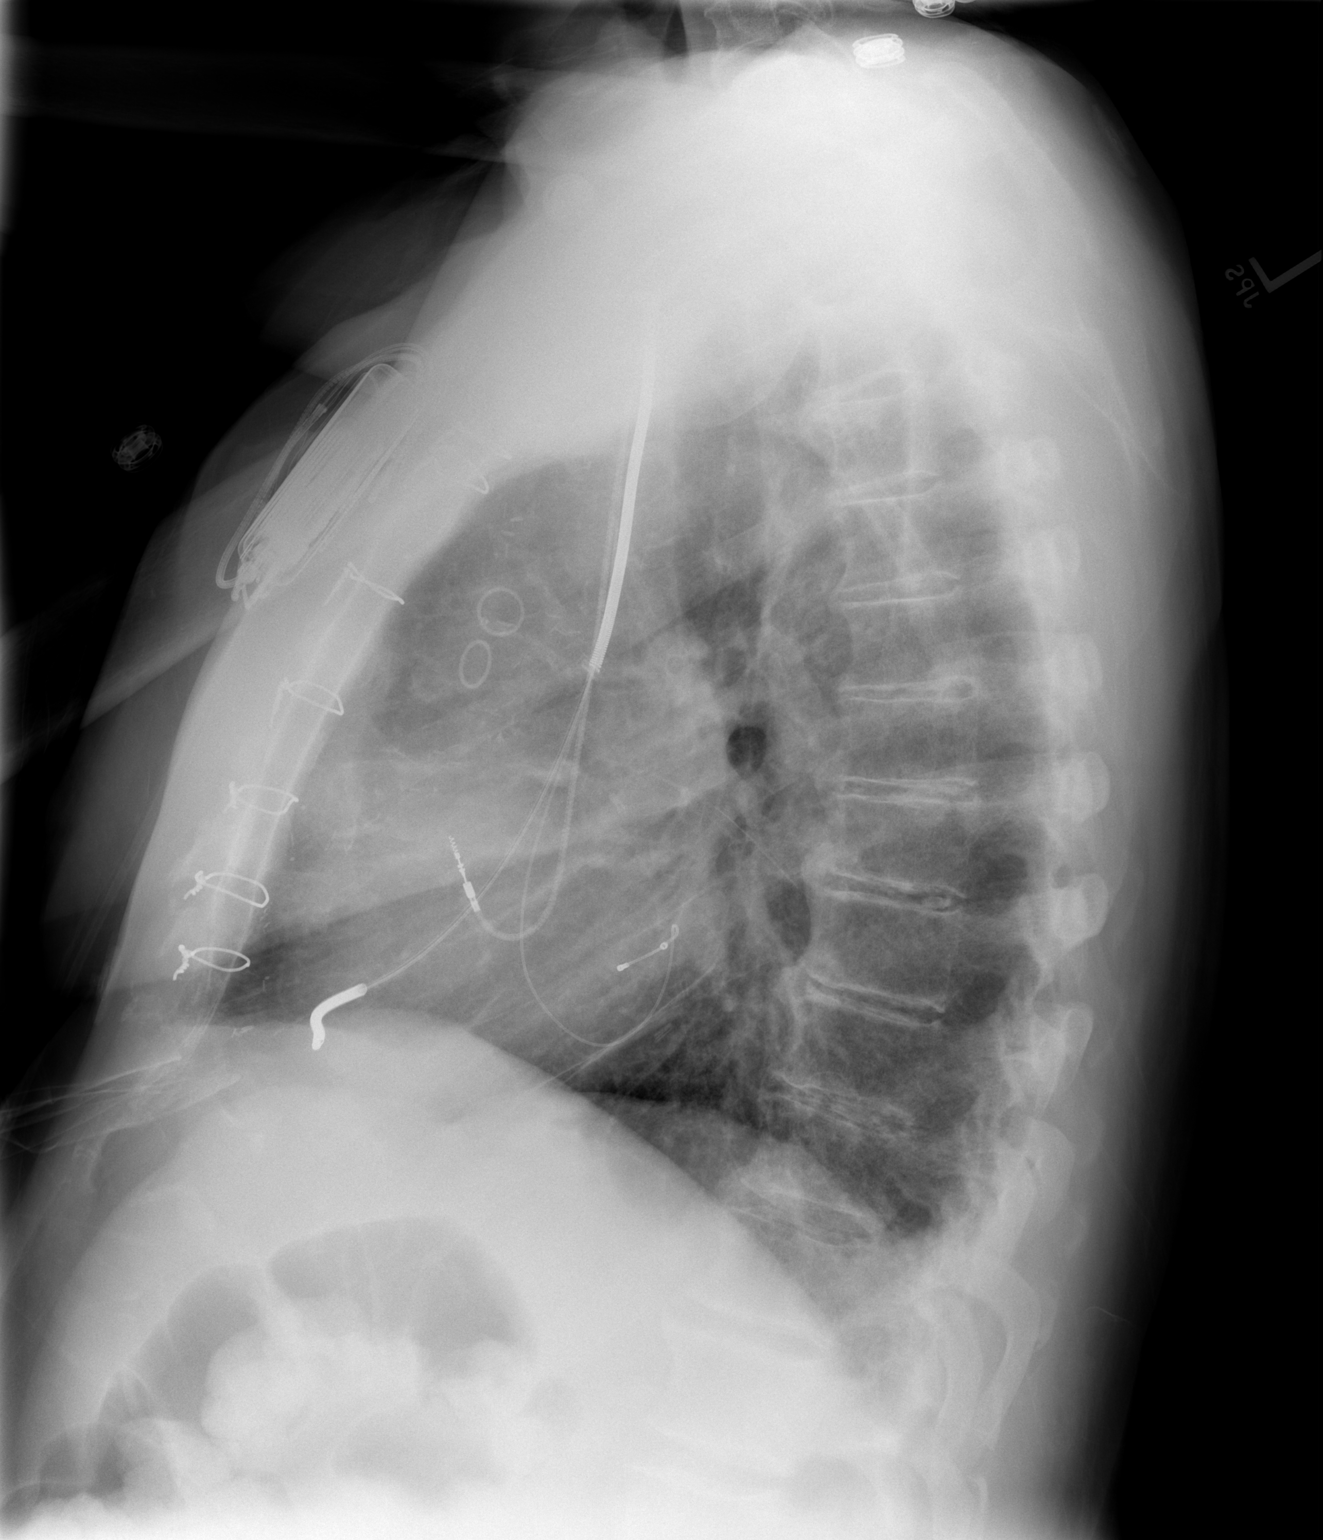

[2 of 2 positions shown; findings below may reference images not displayed]

FINDINGS: AICD/pacer device noted with leads projecting over the
right atrium, right ventricle, and coronary great cardiac vein.  No
pneumothorax or complicating feature identified.  Prior CABG noted.
Heart size is in the upper normal range.

Thoracic spondylosis noted.  No findings of pulmonary edema.

Tubing extending in the right chest is probably from a
ventriculoperitoneal shunt.
IMPRESSION: 1.  AICD/pacer device in place with leads positioned over the right
atrium, right ventricle, and great cardiac vein.  No pneumothorax
or complicating feature identified.

## 2010-10-18 ENCOUNTER — Ambulatory Visit: Payer: Self-pay | Admitting: Gastroenterology

## 2010-10-30 ENCOUNTER — Encounter: Payer: Self-pay | Admitting: Endocrinology

## 2010-11-01 ENCOUNTER — Ambulatory Visit: Payer: Medicare Other | Admitting: Vascular Surgery

## 2010-11-01 ENCOUNTER — Ambulatory Visit: Payer: Self-pay | Admitting: Internal Medicine

## 2010-11-01 ENCOUNTER — Ambulatory Visit: Payer: Self-pay | Admitting: Endocrinology

## 2010-11-10 ENCOUNTER — Ambulatory Visit: Payer: Self-pay | Admitting: Internal Medicine

## 2010-11-10 DIAGNOSIS — Z95810 Presence of automatic (implantable) cardiac defibrillator: Secondary | ICD-10-CM | POA: Insufficient documentation

## 2010-11-30 ENCOUNTER — Encounter: Payer: Self-pay | Admitting: Internal Medicine

## 2010-12-22 ENCOUNTER — Encounter: Payer: Self-pay | Admitting: Internal Medicine

## 2010-12-23 ENCOUNTER — Emergency Department: Payer: Medicare Other | Admitting: Emergency Medicine

## 2011-01-09 ENCOUNTER — Telehealth: Payer: Self-pay | Admitting: Endocrinology

## 2011-01-10 ENCOUNTER — Ambulatory Visit: Payer: Medicare Other | Admitting: Vascular Surgery

## 2011-01-26 NOTE — Letter (Signed)
Summary: [facility]  [facility]   Imported By: Lester [place] 03/02/2010 09:10:49  _____________________________________________________________________  External Attachment:    Type:   Image     Comment:   External Document

## 2011-01-26 NOTE — Assessment & Plan Note (Signed)
Summary: 3 mos f/u # cd   Vital Signs:  Patient profile:   77 year old male Height:      73 inches (185.42 cm) Weight:      205.75 pounds (93.52 kg) BMI:     27.24 O2 Sat:      95 % on Room air Temp:     98.1 degrees F (36.72 degrees C) oral Pulse rate:   102 / minute BP sitting:   92 / 52  (left arm) Cuff size:   regular  Vitals Entered By: Brenton Grills MA (July 26, 2010 10:30 AM)  O2 Flow:  Room air CC: 3 mo F/U/aj Is Patient Diabetic? Yes   CC:  3 mo F/U/aj.  History of Present Illness: pt states he feels well in general.  he brings a record of his cbg's which i have reviewed today.  almost all are checked in am (approx 200).  it is mid-100's before lunch.  he does not check before supper, or at hs.    Current Medications (verified): 1)  Lipitor 20 Mg  Tabs (Atorvastatin Calcium) .... Take 1 By Mouth Qd 2)  Celexa 20 Mg Tabs (Citalopram Hydrobromide) .Marland Kitchen.. 1 Tab By Mouth Once Daily 3)  Unifine Pentips 31g X 8 Mm  Misc (Insulin Pen Needle) .... Use As Directed 4)  Humulin N Pen 100 Unit/ml Susp (Insulin Isophane Human) .... 75 Units At Bedtime 5)  Humalog Kwikpen 100 Unit/ml Soln (Insulin Lispro (Human)) .... 4 Times A Day: 30-20-40-15 Units 6)  Nexium 40 Mg Cpdr (Esomeprazole Magnesium) .... Take 1 Tablet By Mouth Two Times A Day 7)  Bd U/f Short Pen Needle 31g X 8 Mm Misc (Insulin Pen Needle) .... Ue As Directed 8)  Adult Aspirin Low Strength 81 Mg Tbdp (Aspirin) .... Take 1 By Mouth Qd 9)  Ambien 5 Mg Tabs (Zolpidem Tartrate) .Marland Kitchen.. 1 By Mouth At Bedtime As Needed 10)  Phenergan 12.5 .... As Needed 11)  Zofran 4 Mg Tabs (Ondansetron Hcl) .... As Needed 12)  Calcium Acetate 667 Mg Caps (Calcium Acetate (Phos Binder)) .... Take 1 Cap Before Meals 13)  Rena-Vite  Tabs (B Complex-C-Folic Acid) .... Take 1 Tablet By Mouth Once A Day 14)  Hydroxyzine Hcl 25 Mg Tabs (Hydroxyzine Hcl) .... As Needed For Itching 15)  Imdur 60 Mg Xr24h-Tab (Isosorbide Mononitrate) .... Take 1 By  Mouth Qd 16)  Nitroglycerin 0.4 Mg/hr Pt24 (Nitroglycerin) .... Use As Needed 17)  Carvedilol 25 Mg Tabs (Carvedilol) .Marland Kitchen.. 1 Two Times A Day 18)  Dialysis .... On Mon, Wed, and Fri 19)  Nepro  Liqd (Nutritional Supplements) .... Daily 20)  Vitamin From Dialysis Center .... As Directed 21)  Sarna 0.5-0.5 % Lotn (Camphor-Menthol) .... As Directed 22)  Cpap .... For Obstructive Sleep Apnea 23)  Glucagon Emergency 1 Mg Kit (Glucagon (Rdna)) .... As Dir 24)  Stool Softener 100 Mg Caps (Docusate Sodium) .... As Needed 25)  Plavix 75 Mg Tabs (Clopidogrel Bisulfate) .... Take One Tablet By Mouth Daily 26)  Effient 10 Mg Tabs (Prasugrel Hcl) .... 30 Mg X 2 Doses Then 1 Once Daily 27)  Ranexa 500 Mg Xr12h-Tab (Ranolazine) .Marland Kitchen.. 1 Two Times A Day  Allergies (verified): 1)  ! Procardia 2)  ! Adhesive Tape  Past History:  Past Medical History: Last updated: 04/26/2010 SLEEP APNEA, OBSTRUCTIVE - CPAP qHS ESRD - HD MWF    a/w anemia of chronic dz HYPERTENSION  DYSLIPIDEMIA GERD DIABETES MELLITUS, insulin dep  a/w peripheral neuropathy and gastroparesis CORONARY ARTERY DISEASE s/p CABG 2003 - cath 12/31/09 - occluded sap dx graft, otherwise patent grafts chronic syst CHF - LVEF 35-40% by cath 12/31/09 depression Vit B12 deficency normal pressure hydrocephalus s/p VP shunt 08/2009  MD rooster -  endo - Baylea Milburn cards - [facility] nishan/taylor GI - [facility] pat[facility] renal - patel nsurg - cram    Review of Systems  The patient denies hypoglycemia.    Physical Exam  General:  normal appearance.   Pulses:  dorsalis pedis intact bilat.  Extremities:  no deformity.  no ulcer on the feet.  there is rust-clored discoration of the legs.  no edema the dorsal aspect of the right foot has a slight bony prominence. Neurologic:  sensation is intact to touch on the feet, but decreased from normal.  Additional Exam:  Hemoglobin A1C            6.3 %                       4.6-6.5 Cholesterol               186  mg/dL                   1-610 Triglycerides        [H]  527.0 mg/dL    Impression & Recommendations:  Problem # 1:  DIABETES MELLITUS, TYPE I, UNCONTROLLED (ICD-250.03) overcontrolled  Problem # 2:  HYPERCHOLESTEROLEMIA (ICD-272.0) needs increased rx  Medications Added to Medication List This Visit: 1)  Humulin N Pen 100 Unit/ml Susp (Insulin isophane human) .... 70 units at bedtime 2)  Humalog Kwikpen 100 Unit/ml Soln (Insulin lispro (human)) .... 4 times a day: 30-20-40-15 units  Other Orders: TLB-A1C / Hgb A1C (Glycohemoglobin) (83036-A1C) Est. Patient Level III (96045)  Patient Instructions: 1)  tests are being ordered for you today.  a few days after the test(s), please call [phone] to hear your test results. 2)  pending the test results, please continue novolog (just before each meal) 30-20-40-15 units (the 15 units is if you eat a bedtime snack).  3)  take extra novolog 5 units as needed for any sugar over 300. 4)  Please schedule a follow-up appointment in 3 months. 5)  reduce nph to 70 units at night. 6)  check your blood sugar 4 times a day--before the 3 meals, and at bedtime.  also check if you have symptoms of your blood sugar being too high or too low.  please keep a record of the readings and bring it to your next appointment here.  please call us sooner if you are having low blood sugar episodes.  checking for low-blood sugar is very important to do in dialysis patients. 7)  (update: i left message on phone-tree:  as above.  also, please consider fenofibrate--let me know if you decide to take).

## 2011-01-26 NOTE — Progress Notes (Signed)
Summary: [facility] paperwork  Phone Note Other Incoming   Summary of Call: Received paperwork from [facility] for home blood glucose testing. Put on MD's desk. Initial call taken by: Josph Macho CMA,  January 03, 2010 3:38 PM

## 2011-01-26 NOTE — Progress Notes (Signed)
Summary: Podiatry CBG concerns  Phone Note From Other Clinic   Caller: Receptionist [facility] Summary of Call: Receptionist called stating pt's CBG is elevated. Receptionist is requesting we call pt to offer appt with [facility] for DM eval. Initial call taken by: Margaret Pyle, CMA,  January 09, 2011 2:16 PM  Follow-up for Phone Call        Pt has appt scheduled with SAE 02/09 and declined moving appt at this time, stating when he saw Podiatrist 01/12 his CBG was around 225, slightly elevated from normal. Pt has his CBGs check every other day by Dialysis and per their records pt's CBG are usally within his normal range. Follow-up by: Margaret Pyle, CMA,  January 09, 2011 2:57 PM

## 2011-01-26 NOTE — Progress Notes (Signed)
Summary: REFILL QUESTION  <wait on PCN to answer>  Phone Note From Pharmacy Call back at [phone]   Caller: [facility] [address] #[id number]* Summary of Call: HAS A QUESTION ABOUT  PRESCRIPTION NITROGLYCERIN 0.4 MG/HR PT24 (NITROGLYCERIN) use as needed  #25 x PRN Initial call taken by: Migdalia Dk,  April 01, 2010 2:59 PM  Follow-up for Phone Call        Spoke with Homero Fellers pharmacist regarding prescription refill for  Nitroglycerin  0.4 mg/hr PT24 . Medication is to be  use as needed. Prescription refill was send electronicaly by Altamese Dilling RN on 02/ 08/11.  Pharmacist states that the prescrition NTC patch  as needed is not correct. Prn is only for the NTG  tablets SL. Pharmacist would like to have  verified the prescription by the MD. Ollen Gross, RN, BSN  April 01, 2010 3:33 PM   Additional Follow-up for Phone Call Additional follow up Details #1::        Should be sub lingual nitro as needed.   Colon Branch, MD, [facility]  April 03, 2010 10:43 PM  per pt wife calling regarding nitro patches and pharmacy .[phone] / [phone] Lorne Skeens  April 04, 2010 9:02 AM    Pharamacist states that pt has been using patches every night for a month. Been putting on at night and taking off in the morning. Pt been having alot of angina and patches seem to help Asher Muir Little  April 04, 2010 11:45 AM Additional Follow-up by: Colon Branch, MD, [facility],  April 03, 2010 10:43 PM    Additional Follow-up for Phone Call Additional follow up Details #2::    pt will cont on the NTG patches. script called to pharm and wife aware Deliah Goody, RN  April 07, 2010 5:55 PM

## 2011-01-26 NOTE — Assessment & Plan Note (Signed)
Summary: POST HOSP--PACE MAKER--DEFIB'LATOR-STC   Vital Signs:  Patient profile:   77 year old male Height:      73 inches (185.42 cm) Weight:      202.0 pounds (91.82 kg) O2 Sat:      96 % on Room air Temp:     98.3 degrees F (36.83 degrees C) oral Pulse rate:   113 / minute BP sitting:   120 / 62  (right arm) Cuff size:   regular  Vitals Entered By: Orlan Leavens RMA (August 23, 2010 1:11 PM)  O2 Flow:  Room air CC: Hosp follow-up Is Patient Diabetic? Yes Did you bring your meter with you today? No Pain Assessment Patient in pain? no        Primary Care Ahmarion Saraceno:  Newt Lukes MD  CC:  Hosp follow-up.  History of Present Illness:  here for hospital followup - [facility] 8/21-8/24 related to symptoms of bradycardia s/p BiVICD  also reviewed chronic med issues: 1) ESRD - HD MWF - begun 09/2009 - no UOP continued hypotension since dc from hosp 01/02/10 a/w HD  s/p med changes for BP/card meds but not sig improved  2) DM2 - follows with endo for same - reports cbgs range variable, checks sugars 2-3 x/day- compliant with insulin as rx'd no signs or symptoms hypoglycemia  3) depression hx -  reports compliance with ongoing medical treatment - celexa denies adverse side effects related to current therapy.  no active depression signs or symptoms per pt and wife  4) CAD - cath 12/31/09 without sig occlusion of prior CABG grafts + near daily (5/7 d/wk) recurrent CP - has seen cards for same- relieved with NTG SL use no syncope but severe dizziness (rekated to low BP, esp s/p HD) feels better since stopping amio    Clinical Review Panels:  Lipid Management   Cholesterol:  186 (07/26/2010)   HDL (good cholesterol):  31.70 (07/26/2010)  Diabetes Management   HgBA1C:  6.3 (07/26/2010)   Creatinine:  7.05 (08/17/2010)   Last Flu Vaccine:  Historical (09/23/2009)   Last Pneumovax:  Historical (12/25/2008)  CBC   WBC:  4.4 (08/17/2010)   RBC:  2.19 (08/17/2010)   Hgb:   8.9 (08/17/2010)   Hct:  26.6 (08/17/2010)   Platelets:  123 (08/17/2010)   MCV  100.5 (08/17/2010)   MCHC  35.1 (10/27/2008)   RDW  16.2 (08/17/2010)   PMN:  75 (08/14/2010)   Lymphs:  12.7 (10/27/2008)   Monos:  8 (08/14/2010)   Eosinophils:  0 (08/14/2010)   Basophil:  0 (08/14/2010)  Complete Metabolic Panel   Glucose:  176 (08/17/2010)   Sodium:  135 (08/17/2010)   Potassium:  4.0 (08/17/2010)   Chloride:  98 (08/17/2010)   CO2:  28 (08/17/2010)   BUN:  37 (08/17/2010)   Creatinine:  7.05 (08/17/2010)   Albumin:  2.9 (08/17/2010)   Total Protein:  7.4 (07/26/2010)   Calcium:  8.3 (08/17/2010)   Total Bili:  1.0 (07/26/2010)   Alk Phos:  20 (07/26/2010)   SGPT (ALT):  21 (07/26/2010)   SGOT (AST):  23 (07/26/2010)   -  Date:  08/17/2010    WBC: 4.4    HGB: 8.9    HCT: 26.6    RBC: 2.19    PLT: 123    MCV: 100.5    RDW: 16.2    BG Random: 176    BUN: 37    Creatinine: 7.05    Sodium: 135  Potassium: 4.0    Chloride: 98    CO2 Total: 28    Calcium: 8.3    Albumin: 2.9  Date:  08/16/2010    WBC: 5.8    HGB: 9.3    HCT: 29.3    RBC: 2.80    PLT: 164    MCV: 104.6    RDW: 16.6    BG Random: 115    BUN: 18    Creatinine: 5.06    Sodium: 139    Potassium: 4.0    Chloride: 96    CO2 Total: 33  Date:  08/15/2010    BG Random: 214    BUN: 11    Creatinine: 4.09    Sodium: 135    Potassium: 3.9    Chloride: 95    CO2 Total: 28    TSH: 1.296  Date:  08/14/2010    WBC: 5.5    HGB: 8.4    HCT: 26.3    RBC: 2.49    PLT: 162    MCV: 105.6    RDW: 17.1    Neutrophil: 75    Lymphs: 17    Monos: 8    Eos: 0    Basophil: 0  Current Medications (verified): 1)  Lipitor 20 Mg  Tabs (Atorvastatin Calcium) .... Take 1 By Mouth Qd 2)  Celexa 20 Mg Tabs (Citalopram Hydrobromide) .Marland Kitchen.. 1 Tab By Mouth Once Daily 3)  Unifine Pentips 31g X 8 Mm  Misc (Insulin Pen Needle) .... Use As Directed 4)  Humulin N Pen 100 Unit/ml Susp (Insulin Isophane Human)  .... 70 Units At Bedtime 5)  Humalog Kwikpen 100 Unit/ml Soln (Insulin Lispro (Human)) .... 4 Times A Day: 30-20-40-15 Units 6)  Nexium 40 Mg Cpdr (Esomeprazole Magnesium) .... Take 1 Tablet By Mouth Two Times A Day 7)  Bd U/f Short Pen Needle 31g X 8 Mm Misc (Insulin Pen Needle) .... Ue As Directed 8)  Adult Aspirin Low Strength 81 Mg Tbdp (Aspirin) .... Take 1 By Mouth Qd 9)  Ambien 5 Mg Tabs (Zolpidem Tartrate) .Marland Kitchen.. 1 By Mouth At Bedtime As Needed 10)  Phenergan 12.5 .... As Needed 11)  Zofran 4 Mg Tabs (Ondansetron Hcl) .... As Directed 12)  Calcium Acetate 667 Mg Caps (Calcium Acetate (Phos Binder)) .... Take 2 Cap Before Meals 13)  Rena-Vite  Tabs (B Complex-C-Folic Acid) .... Take 1 Tablet By Mouth Once A Day 14)  Hydroxyzine Hcl 25 Mg Tabs (Hydroxyzine Hcl) .... Morning of Dialysis 15)  Imdur 60 Mg Xr24h-Tab (Isosorbide Mononitrate) .... Take 1 By Mouth Qd 16)  Nitroglycerin 0.4 Mg/hr Pt24 (Nitroglycerin) .... Use As Needed 17)  Dialysis .... On Mon, Wed, and Fri 18)  Nepro  Liqd (Nutritional Supplements) .... Daily 19)  Vitamin From Dialysis Center .... As Directed 20)  Sarna 0.5-0.5 % Lotn (Camphor-Menthol) .... As Directed 21)  Cpap .... For Obstructive Sleep Apnea 22)  Glucagon Emergency 1 Mg Kit (Glucagon (Rdna)) .... As Dir 23)  Stool Softener 100 Mg Caps (Docusate Sodium) .... As Needed 24)  Effient 10 Mg Tabs (Prasugrel Hcl) .... 30 Mg X 2 Doses Then 1 Once Daily 25)  Ranexa 500 Mg Xr12h-Tab (Ranolazine) .... Tale 1 By Mouth Once Daily 26)  Tylenol 325 Mg Tabs (Acetaminophen) .... As Needed 27)  Carvedilol 12.5 Mg Tabs (Carvedilol) .... Take 1 Two Times A Day  Allergies (verified): 1)  ! Procardia 2)  ! Adhesive Tape  Past History:  Past Medical History: SLEEP APNEA, OBSTRUCTIVE -  CPAP qHS ESRD - HD MWF    a/w anemia of chronic dz, hypotension HYPERTENSION hx DYSLIPIDEMIA GERD DIABETES MELLITUS, insulin dep   a/w peripheral neuropathy and gastroparesis CORONARY  ARTERY DISEASE s/p CABG 2003 - cath 12/31/09 - occluded sap dx graft, otherwise patent grafts chronic syst CHF - LVEF 35-40% by cath 12/31/09; s/p BiV ICD 07/2010 depression Vit B12 deficency normal pressure hydrocephalus s/p VP shunt 08/2009  MD roster -  endo - ellison  cards - [facility] nishan/taylor GI - [facility] renal - patel nsurg - cram    Past Surgical History: CORONARY ARTERY BYPASS GRAFT, THREE VESSEL, HX OF (2003) CATARACT EXTRACTIONS, BILATERAL, HX OF (ICD-V45.61) Permanent pacemaker  Review of Systems  The patient denies anorexia, chest pain, headaches, and abdominal pain.    Physical Exam  General:  alert, well-developed, well-nourished, and cooperative to examination.   spouse at side Chest Wall:  no hematoma or inflammation signs at site of new BiV ICD right chest wall - c/d/i w/o drainage Lungs:  normal respiratory effort, no intercostal retractions or use of accessory muscles; normal breath sounds bilaterally - no crackles and no wheezes.    Heart:  normal rate (high end of normal), regular rhythm, no murmur, and no rub. BLE without edema. Psych:  Oriented X3, memory intact for recent and remote, normally interactive, good eye contact, not anxious appearing, not depressed appearing, and not agitated.      Impression & Recommendations:  Problem # 1:  CHRONIC SYSTOLIC HEART FAILURE (ICD-428.22)  s/p BiVICD 08/16/10 for symptomatic bradycardia/LBBB - symptoms imrpoved - hosp couse and labs reviewed -  f/u EP cards as planned -  The following medications were removed from the medication list:    Carvedilol 25 Mg Tabs (Carvedilol) .Marland Kitchen... 1 two times a day    Plavix 75 Mg Tabs (Clopidogrel bisulfate) .Marland Kitchen... Take one tablet by mouth daily His updated medication list for this problem includes:    Adult Aspirin Low Strength 81 Mg Tbdp (Aspirin) .Marland Kitchen... Take 1 by mouth qd    Effient 10 Mg Tabs (Prasugrel hcl) .Marland KitchenMarland KitchenMarland KitchenMarland Kitchen 30 mg x 2 doses then 1 once daily    Carvedilol 12.5 Mg Tabs  (Carvedilol) .Marland Kitchen... Take 1 two times a day  Echocardiogram: SUMMARY   -  Overall left ventricular systolic function was normal. Left         ventricular ejection fraction was estimated , range being 55         % to 65 %. There were no left ventricular regional wall         motion abnormalities. Left ventricular wall thickness was         mildly increased. Doppler parameters were consistent with         high left ventricular filling pressure.   -  There was mild mitral annular calcification. There was mild         mitral valvular regurgitation.   -  The left atrium was mildly dilated.   -  The estimated peak pulmonary artery systolic pressure was mild to         moderately increased.   -  The right atrium was mildly dilated.     ---------------------------------------------------------------    Prepared and Electronically Authenticated by    Olga Millers M.D. Ejection Fraction:  >=50%.   (11/05/2007)  Problem # 2:  RENAL FAILURE, END STAGE (ICD-585.6)  on HD MWF since 09/2009 - reviewed problems a/w HD (such as hypotension and travel to HD  visits) and need to coordinate labs  Labs Reviewed: BUN: 39 (01/01/2010)   Cr: 4.12 (01/01/2010)    Hgb: 13.7 (01/01/2010)   Hct: 39.8 (01/01/2010)   Ca++: 9.5 (01/01/2010)    TP: 7.5 (10/27/2008)   Alb: 3.6 (10/27/2008)  Problem # 3:  DIABETES MELLITUS, TYPE I, UNCONTROLLED (ICD-250.03)  His updated medication list for this problem includes:    Humulin N Pen 100 Unit/ml Susp (Insulin isophane human) .Marland KitchenMarland KitchenMarland KitchenMarland Kitchen 70 units at bedtime    Humalog Kwikpen 100 Unit/ml Soln (Insulin lispro (human)) .Marland KitchenMarland KitchenMarland KitchenMarland Kitchen 4 times a day: 30-20-40-15 units    Adult Aspirin Low Strength 81 Mg Tbdp (Aspirin) .Marland Kitchen... Take 1 by mouth qd    Glucagon Emergency 1 Mg Kit (Glucagon (rdna)) .Marland Kitchen... As dir  mgmt as ongoing by endo  Labs Reviewed: Creat: 7.05 (08/17/2010)    Reviewed HgBA1c results: 6.3 (07/26/2010)  7.0 (04/26/2010)  Problem # 4:  HYPERCHOLESTEROLEMIA  (ICD-272.0)  His updated medication list for this problem includes:    Lipitor 20 Mg Tabs (Atorvastatin calcium) .Marland Kitchen... Take 1 by mouth qd  Labs Reviewed: SGOT: 23 (07/26/2010)   SGPT: 21 (07/26/2010)   HDL:31.70 (07/26/2010)  Chol:186 (07/26/2010)  Trig:527.0 (07/26/2010)  Problem # 5:  HYPERTENSION (ICD-401.9)  The following medications were removed from the medication list:    Carvedilol 25 Mg Tabs (Carvedilol) .Marland Kitchen... 1 two times a day His updated medication list for this problem includes:    Carvedilol 12.5 Mg Tabs (Carvedilol) .Marland Kitchen... Take 1 two times a day  BP today: 120/62 Prior BP: 112/63 (07/26/2010)  Labs Reviewed: K+: 4.0 (08/17/2010) Creat: : 7.05 (08/17/2010)   Chol: 186 (07/26/2010)   HDL: 31.70 (07/26/2010)   TG: 527.0 (07/26/2010)  Complete Medication List: 1)  Lipitor 20 Mg Tabs (Atorvastatin calcium) .... Take 1 by mouth qd 2)  Celexa 20 Mg Tabs (Citalopram hydrobromide) .Marland Kitchen.. 1 tab by mouth once daily 3)  Unifine Pentips 31g X 8 Mm Misc (Insulin pen needle) .... Use as directed 4)  Humulin N Pen 100 Unit/ml Susp (Insulin isophane human) .... 70 units at bedtime 5)  Humalog Kwikpen 100 Unit/ml Soln (Insulin lispro (human)) .... 4 times a day: 30-20-40-15 units 6)  Nexium 40 Mg Cpdr (Esomeprazole magnesium) .... Take 1 tablet by mouth two times a day 7)  Bd U/f Short Pen Needle 31g X 8 Mm Misc (Insulin pen needle) .... Ue as directed 8)  Adult Aspirin Low Strength 81 Mg Tbdp (Aspirin) .... Take 1 by mouth qd 9)  Ambien 5 Mg Tabs (Zolpidem tartrate) .Marland Kitchen.. 1 by mouth at bedtime as needed 10)  Phenergan 12.5  .... As needed 11)  Zofran 4 Mg Tabs (Ondansetron hcl) .... As directed 12)  Calcium Acetate 667 Mg Caps (Calcium acetate (phos binder)) .... Take 2 cap before meals 13)  Rena-vite Tabs (B complex-c-folic acid) .... Take 1 tablet by mouth once a day 14)  Hydroxyzine Hcl 25 Mg Tabs (Hydroxyzine hcl) .... Morning of dialysis 15)  Imdur 60 Mg Xr24h-tab (Isosorbide  mononitrate) .... Take 1 by mouth qd 16)  Nitroglycerin 0.4 Mg/hr Pt24 (Nitroglycerin) .... Use as needed 17)  Dialysis  .... On mon, wed, and fri 18)  Nepro Liqd (Nutritional supplements) .... Daily 19)  Vitamin From Dialysis Center  .... As directed 20)  Sarna 0.5-0.5 % Lotn (Camphor-menthol) .... As directed 21)  Cpap  .... For obstructive sleep apnea 22)  Glucagon Emergency 1 Mg Kit (Glucagon (rdna)) .... As dir 23)  Stool Softener 100 Mg Caps (  Docusate sodium) .... As needed 24)  Effient 10 Mg Tabs (Prasugrel hcl) .... 30 mg x 2 doses then 1 once daily 25)  Ranexa 500 Mg Xr12h-tab (Ranolazine) .... Tale 1 by mouth once daily 26)  Tylenol 325 Mg Tabs (Acetaminophen) .... As needed 27)  Carvedilol 12.5 Mg Tabs (Carvedilol) .... Take 1 two times a day  Patient Instructions: 1)  it was good to see you today. 2)  hospital course reviewed - things look good 3)  no other medication changes today - continue as ongoing per hospital instructions 4)  Please keep follow-up appointment as previously scheduled, sooner if problems.

## 2011-01-26 NOTE — Assessment & Plan Note (Signed)
Summary: 3-4  mos f/u//#/cd   Vital Signs:  Patient profile:   77 year old male Height:      73 inches (185.42 cm) Weight:      198 pounds (90.00 kg) O2 Sat:      99 % on Room air Temp:     98.0 degrees F (36.67 degrees C) oral Pulse rate:   111 / minute BP sitting:   112 / 62  (right arm) Cuff size:   regular  Vitals Entered By: Orlan Leavens (Apr 26, 2010 1:39 PM)  O2 Flow:  Room air CC: 3 month follow-up Is Patient Diabetic? Yes Did you bring your meter with you today? No Pain Assessment Patient in pain? no        Primary Care Provider:  Sharlet Salina, MD  CC:  3 month follow-up.  History of Present Illness: here for 3 month f/u  1) ESRD - HD MWF - begun 09/2009 recent hypotension since dc from hosp 01/02/10 a/w HD causing med changes for BP/card meds spouse ?if feasible to do HD at home  2) DM2 - follows with endo for same - reports cbgs range variable, checks sugars 2-3 x/day- compliant with insulin as rx'd  3) HTN - med changes as noted in #1 above - reports compliance with ongoing medical treatment since that time. denies adverse side effects related to current therapy.   4) CAD - cath 12/31/09 without sig occlusion of prior CABG grafts + near daily (5/7 d/wk) recurrent CP - has seen cards for same- relieved with NTG SL use  no syncope or dizziness - feels better since stopping amio 1 mo ago   Current Medications (verified): 1)  Lipitor 20 Mg  Tabs (Atorvastatin Calcium) .... Take 1 By Mouth Qd 2)  Celexa 20 Mg Tabs (Citalopram Hydrobromide) .Marland Kitchen.. 1 Tab By Mouth Once Daily 3)  Unifine Pentips 31g X 8 Mm  Misc (Insulin Pen Needle) .... Use As Directed 4)  Humulin N Pen 100 Unit/ml Susp (Insulin Isophane Human) .... 75 Units At Bedtime, 5)  Humalog Kwikpen 100 Unit/ml Soln (Insulin Lispro (Human)) .... Qac (Three Times A Day) 30-20-40 Units 6)  Nexium 40 Mg Cpdr (Esomeprazole Magnesium) .... Take 1 Tablet By Mouth Two Times A Day 7)  Bd U/f Short Pen Needle  31g X 8 Mm Misc (Insulin Pen Needle) .... Ue As Directed 8)  Adult Aspirin Low Strength 81 Mg Tbdp (Aspirin) .... Take 1 By Mouth Qd 9)  Ambien 5 Mg Tabs (Zolpidem Tartrate) .... As Needed 10)  Phenergan 12.5 .... As Needed 11)  Zofran 4 Mg Tabs (Ondansetron Hcl) .... As Needed 12)  Calcium Acetate 667 Mg Caps (Calcium Acetate (Phos Binder)) .... Take 1 Cap Before Meals 13)  Rena-Vite  Tabs (B Complex-C-Folic Acid) .... Take 1 Tablet By Mouth Once A Day 14)  Hydroxyzine Hcl 25 Mg Tabs (Hydroxyzine Hcl) .... As Needed For Itching 15)  Oxycodone-Acetaminophen 5-500 Mg Caps (Oxycodone-Acetaminophen) .... As Needed 16)  Imdur 60 Mg Xr24h-Tab (Isosorbide Mononitrate) .... Take 1 By Mouth Qd 17)  Nitroglycerin 0.4 Mg/hr Pt24 (Nitroglycerin) .... Use As Needed 18)  Carvedilol 12.5 Mg Tabs (Carvedilol) .... Take One Tablet By Mouth Twice A Day Except When He Has Dialysis in [place] Skip Morning Dose 19)  Dialysis .... On Mon, Wed, and Fri 20)  Nepro  Liqd (Nutritional Supplements) .... Daily 21)  Vitamin From Dialysis Center .... As Directed 22)  Sarna 0.5-0.5 % Lotn (Camphor-Menthol) .Marland KitchenMarland KitchenMarland Kitchen  As Directed 23)  Cpap .... For Obstructive Sleep Apnea 24)  Glucagon Emergency 1 Mg Kit (Glucagon (Rdna)) .... As Dir 25)  Stool Softener 100 Mg Caps (Docusate Sodium) .... As Needed 26)  Amiodarone Hcl 400 Mg Tabs (Amiodarone Hcl) .... Take One Tablet By Mouth Twice Daily  Allergies (verified): 1)  ! Procardia 2)  ! Adhesive Tape  Past History:  Past Medical History: SLEEP APNEA, OBSTRUCTIVE - CPAP qHS ESRD - HD MWF    a/w anemia of chronic dz HYPERTENSION  DYSLIPIDEMIA GERD DIABETES MELLITUS, insulin dep   a/w peripheral neuropathy and gastroparesis CORONARY ARTERY DISEASE s/p CABG 2003 - cath 12/31/09 - occluded sap dx graft, otherwise patent grafts chronic syst CHF - LVEF 35-40% by cath 12/31/09 depression Vit B12 deficency normal pressure hydrocephalus s/p VP shunt 08/2009  MD rooster -    endo - [facility] cards - [facility] - [facility] - [facility] - [facility]    Review of Systems  The patient denies anorexia, fever, and abdominal pain.    Physical Exam  General:  alert, well-developed, well-nourished, and cooperative to examination.   spouse at side Lungs:  normal respiratory effort, no intercostal retractions or use of accessory muscles; normal breath sounds bilaterally - no crackles and no wheezes.    Heart:  normal rate (high end of normal), regular rhythm, no murmur, and no rub. BLE without edema.   Impression & Recommendations:  Problem # 1:  RENAL FAILURE, END STAGE (ICD-585.6)  on HD MWF since 09/2009 - reviewed problems a/w HD (such as hypotension and travel to HD visits) and need to coordinate labs labs being done at HD - will not repeat same here  Labs Reviewed: BUN: 39 (01/01/2010)   Cr: 4.12 (01/01/2010)    Hgb: 13.7 (01/01/2010)   Hct: 39.8 (01/01/2010)   Ca++: 9.5 (01/01/2010)    TP: 7.5 (10/27/2008)   Alb: 3.6 (10/27/2008)  Problem # 2:  HYPERTENSION (ICD-401.9)  His updated medication list for this problem includes:    Carvedilol 12.5 Mg Tabs (Carvedilol) .Marland Kitchen... Take one tablet by mouth twice a day except when he has dialysis in the mornings skip morning dose  BP today: 112/62 Prior BP: 82/50 (03/24/2010)  Labs Reviewed: K+: 4.6 (01/01/2010) Creat: : 4.12 (01/01/2010)     Problem # 3:  HYPERCHOLESTEROLEMIA (ICD-272.0)  His updated medication list for this problem includes:    Lipitor 20 Mg Tabs (Atorvastatin calcium) .Marland Kitchen... Take 1 by mouth qd  Labs Reviewed: SGOT: 22 (10/27/2008)   SGPT: 6 (10/27/2008)  Complete Medication List: 1)  Lipitor 20 Mg Tabs (Atorvastatin calcium) .... Take 1 by mouth qd 2)  Celexa 20 Mg Tabs (Citalopram hydrobromide) .Marland Kitchen.. 1 tab by mouth once daily 3)  Unifine Pentips 31g X 8 Mm Misc (Insulin pen needle) .... Use as directed 4)  Humulin N Pen 100 Unit/ml Susp (Insulin isophane human) ....  70 units at bedtime 5)  Humalog Kwikpen 100 Unit/ml Soln (Insulin lispro (human)) .... 4 times a day: 30-20-40-10 units 6)  Nexium 40 Mg Cpdr (Esomeprazole magnesium) .... Take 1 tablet by mouth two times a day 7)  Bd U/f Short Pen Needle 31g X 8 Mm Misc (Insulin pen needle) .... Ue as directed 8)  Adult Aspirin Low Strength 81 Mg Tbdp (Aspirin) .... Take 1 by mouth qd 9)  Ambien 5 Mg Tabs (Zolpidem tartrate) .Marland Kitchen.. 1 by mouth at bedtime as needed 10)  Phenergan 12.5  .... As needed 11)  Zofran 4 Mg Tabs (Ondansetron hcl) .... As needed 12)  Calcium Acetate 667 Mg Caps (Calcium acetate (phos binder)) .... Take 1 cap before meals 13)  Rena-vite Tabs (B complex-c-folic acid) .... Take 1 tablet by mouth once a day 14)  Hydroxyzine Hcl 25 Mg Tabs (Hydroxyzine hcl) .... As needed for itching 15)  Oxycodone-acetaminophen 5-500 Mg Caps (Oxycodone-acetaminophen) .... As needed 16)  Imdur 60 Mg Xr24h-tab (Isosorbide mononitrate) .... Take 1 by mouth qd 17)  Nitroglycerin 0.4 Mg/hr Pt24 (Nitroglycerin) .... Use as needed 18)  Carvedilol 12.5 Mg Tabs (Carvedilol) .... Take one tablet by mouth twice a day except when he has dialysis in the mornings skip morning dose 19)  Dialysis  .... On mon, wed, and fri 20)  Nepro Liqd (Nutritional supplements) .... Daily 21)  Vitamin From Dialysis Center  .... As directed 22)  Sarna 0.5-0.5 % Lotn (Camphor-menthol) .... As directed 23)  Cpap  .... For obstructive sleep apnea 24)  Glucagon Emergency 1 Mg Kit (Glucagon (rdna)) .... As dir 25)  Stool Softener 100 Mg Caps (Docusate sodium) .... As needed  Patient Instructions: 1)  it was good to see you today. 2)  prescription for ambein as discussed to use as needed  3)  no other medication changes from me - 4)  if you feel the need for a rolling walker, contact [facility] - they can then contact me as needed to arrange for same 5)  Please schedule a follow-up appointment in 3-6 months, sooner if problems.    Prescriptions: AMBIEN 5 MG TABS (ZOLPIDEM TARTRATE) 1 by mouth at bedtime as needed  #30 x 5   Entered and Authorized by:   Newt Lukes MD   Signed by:   Newt Lukes MD on 04/26/2010   Method used:   Print then Give to Patient   RxID:   [id number]

## 2011-01-26 NOTE — Medication Information (Signed)
Summary: Diabetic supplies/[facility]  [facility]abetic supplies[facility]   Imported By: Lester Weyauwega 01/27/2010 09:07:54  _____________________________________________________________________  External Attachment:    Type:   Image     Comment:   External Document

## 2011-01-26 NOTE — Medication Information (Signed)
Summary: Glucose supplies/[facility]  Glucose supplies/[facility]   Imported By: Lester Sallis 01/06/2010 11:16:54  _____________________________________________________________________  External Attachment:    Type:   Image     Comment:   External Document

## 2011-01-26 NOTE — Progress Notes (Signed)
Summary: Insulin?  Phone Note Call from Patient   Caller: Daughter [phone] Eunice Blase Summary of Call: pt's daughter called stating that pt's Rx for Humalin N was not changed from 25u at bedtime to 74u at bedtime. Rx called into pharmacy, [facility] [phone], spoke with Clydie Braun. Initial call taken by: Margaret Pyle, CMA,  February 15, 2010 9:29 AM

## 2011-01-26 NOTE — Miscellaneous (Signed)
Summary: a1c  Clinical Lists Changes  Observations: Added new observation of HGBA1C: 7.3 % (09/24/2009 15:34)      -  Date:  09/24/2009    HgbA1c: 7.3 done @ [facility]

## 2011-01-26 NOTE — Assessment & Plan Note (Signed)
Summary: to discuss ablation or bi icd/ok per kelly/saf   Visit Type:  Follow-up Referring Provider:  baxley Primary Provider:  Sharlet Salina, MD   History of Present Illness: [name] is referred today by Dr. Eden Emms for evaluation of a wide QRS tachycardia and class 3 CHF in the setting of LBBB.  The patient also has end-stage renal disease and dialyzes through an AV fistula.  He has not had syncope. He has longstanding CAD and is s/p CABG with mostly patent vein grafts.  He has predominantly low output symptoms and struggles with hypotension around his dialysis sessions.  Also, he has a h/o normal pressure hydrocephalus and is s/p VP shunt.  Current Medications (verified): 1)  Lipitor 20 Mg  Tabs (Atorvastatin Calcium) .... Take 1 By Mouth Qd 2)  Celexa 20 Mg Tabs (Citalopram Hydrobromide) .Marland Kitchen.. 1 Tab By Mouth Once Daily 3)  Unifine Pentips 31g X 8 Mm  Misc (Insulin Pen Needle) .... Use As Directed 4)  Humulin N Pen 100 Unit/ml Susp (Insulin Isophane Human) .... 75 Units At Bedtime, 5)  Humalog Kwikpen 100 Unit/ml Soln (Insulin Lispro (Human)) .... Qac (Three Times A Day) 30-20-40 Units 6)  Nexium 40 Mg Cpdr (Esomeprazole Magnesium) .... Take 1 Tablet By Mouth Two Times A Day 7)  Bd U/f Short Pen Needle 31g X 8 Mm Misc (Insulin Pen Needle) .... Ue As Directed 8)  Adult Aspirin Low Strength 81 Mg Tbdp (Aspirin) .... Take 1 By Mouth Qd 9)  Ambien 5 Mg Tabs (Zolpidem Tartrate) .... As Needed 10)  Phenergan 12.5 .... As Needed 11)  Zofran 4 Mg Tabs (Ondansetron Hcl) .... As Needed 12)  Calcium Acetate 667 Mg Caps (Calcium Acetate (Phos Binder)) .... Take 1 Cap Before Meals 13)  Rena-Vite  Tabs (B Complex-C-Folic Acid) .... Take 1 Tablet By Mouth Once A Day 14)  Hydroxyzine Hcl 25 Mg Tabs (Hydroxyzine Hcl) .... As Needed For Itching 15)  Oxycodone-Acetaminophen 5-500 Mg Caps (Oxycodone-Acetaminophen) .... As Needed 16)  Imdur 60 Mg Xr24h-Tab (Isosorbide Mononitrate) .... Take 1 By Mouth  Qd 17)  Nitroglycerin 0.4 Mg/hr Pt24 (Nitroglycerin) .... Use As Needed 18)  Carvedilol 12.5 Mg Tabs (Carvedilol) .... Take One Tablet By Mouth Twice A Day Except When He Has Dialysis in [place] Skip Morning Dose 19)  Dialysis .... On Mon, Wed, and Fri 20)  Nepro  Liqd (Nutritional Supplements) .... Daily 21)  Vitamin From Dialysis Center .... As Directed 22)  Sarna 0.5-0.5 % Lotn (Camphor-Menthol) .... As Directed 23)  Cpap .... For Obstructive Sleep Apnea 24)  Glucagon Emergency 1 Mg Kit (Glucagon (Rdna)) .... As Dir 25)  Stool Softener 100 Mg Caps (Docusate Sodium) .... As Needed 26)  Amiodarone Hcl 400 Mg Tabs (Amiodarone Hcl) .... Take One Tablet By Mouth Twice Daily  Allergies (verified): 1)  ! Procardia 2)  ! Adhesive Tape  Past History:  Past Medical History: Last updated: 01/18/2010 SLEEP APNEA, OBSTRUCTIVE - CPAP qHS ESRD - HD MWF    a/w anemia of chronic dz HYPERTENSION  DYSLIPIDEMIA GERD DIABETES MELLITUS, insulin dep   a/w peripheral neuropathy and gastroparesis CORONARY ARTERY DISEASE s/p CABG 2003 - cath 12/31/09 - occluded sap dx graft, otherwise patent grafts chronic syst CHF - LVEF 35-40% by cath 12/31/09 depression Vit B12 deficency normal pressure hydrocephalus s/p VP shunt 08/2009  MD rooster -  endo - ellison cards - [facility] - [facility] - [facility]    Past Surgical History: Last  updated: 01/18/2010 CORONARY ARTERY BYPASS GRAFT, THREE VESSEL, HX OF (2003) CATARACT EXTRACTIONS, BILATERAL, HX OF (ICD-V45.61)  Review of Systems       All systems reviewed and negative except as noted in the HPI.  Vital Signs:  Patient profile:   77 year old male Height:      73 inches Weight:      195 pounds BMI:     25.82 O2 Sat:      99 % Pulse rate:   109 / minute BP sitting:   82 / 50  (left arm)  Vitals Entered By: Laurance Flatten CMA (March 24, 2010 10:20 AM)  Physical Exam  General:  Affect appropriate Healthy:  appears stated  age HEENT: normal Neck supple with no adenopathy JVP normal no bruits no thyromegaly Lungs clear with no wheezing and good diaphragmatic motion Heart:  S1/S2(split)  MR  murmur, no rub, gallop or click PMI enlarged Abdomen: benighn, BS positve, no tenderness, no AAA no bruit.  No HSM or HJR Distal pulses intact with no bruits No edema. AV fistula is present. Neuro non-focal Skin warm and dry Fistula in left arm with good thrill   EKG  Procedure date:  03/24/2010  Findings:      Sinus tachycardia with rate of:  108.Left bundle branch block.    Impression & Recommendations:  Problem # 1:  TACHYCARDIA (ICD-785.0) His rhythm is actually sinus tachy with LBBB, proven on 12 lead with adenosine infusion.  I have asked the patient to stop his amiodarone.  Problem # 2:  CORONARY ARTERY DISEASE (ICD-414.00) He denies anginal symptoms.  Continue current meds. His updated medication list for this problem includes:    Adult Aspirin Low Strength 81 Mg Tbdp (Aspirin) .Marland Kitchen... Take 1 by mouth qd    Imdur 60 Mg Xr24h-tab (Isosorbide mononitrate) .Marland Kitchen... Take 1 by mouth qd    Nitroglycerin 0.4 Mg/hr Pt24 (Nitroglycerin) ..... Use as needed    Carvedilol 12.5 Mg Tabs (Carvedilol) .Marland Kitchen... Take one tablet by mouth twice a day except when he has dialysis in the mornings skip morning dose  Problem # 3:  CHRONIC SYSTOLIC HEART FAILURE (ICD-428.22) His symptoms are class 3.  I have discussed treatment options with the patient and his family.  He is a candidate for BiV PM but I carefully discussed the pro's and con's of this approach.  He is considering his options and will call us if he would like to proceed with device implant. His updated medication list for this problem includes:    Adult Aspirin Low Strength 81 Mg Tbdp (Aspirin) .Marland Kitchen... Take 1 by mouth qd    Imdur 60 Mg Xr24h-tab (Isosorbide mononitrate) .Marland Kitchen... Take 1 by mouth qd    Nitroglycerin 0.4 Mg/hr Pt24 (Nitroglycerin) ..... Use as needed     Carvedilol 12.5 Mg Tabs (Carvedilol) .Marland Kitchen... Take one tablet by mouth twice a day except when he has dialysis in the mornings skip morning dose    Amiodarone Hcl 400 Mg Tabs (Amiodarone hcl) .Marland Kitchen... Take one tablet by mouth twice daily  Other Orders: EKG w/ Interpretation (93000)   Medication Administration  Medication # 1:    Medication: adenosine    Diagnosis: TACHYCARDIA (ICD-785.0)    Dose: 12mg     Route: iv    Exp Date: 11/23/2010    Lot #: [id number]    Patient tolerated medication without complications    Given by: debra mathis rn  Orders Added: 1)  EKG w/ Interpretation [93000]

## 2011-01-26 NOTE — Assessment & Plan Note (Signed)
Summary: 3 mos f/u #/ cd   Vital Signs:  Patient profile:   77 year old male Height:      73 inches (185.42 cm) Weight:      205.0 pounds (93.18 kg) O2 Sat:      95 % Temp:     98.1 degrees F (36.72 degrees C) oral Pulse rate:   102 / minute BP sitting:   92 / 52  (left arm) Cuff size:   regular  Vitals Entered By: Orlan Leavens RMA (July 26, 2010 11:04 AM) CC: 3 month follow-up Is Patient Diabetic? Yes Did you bring your meter with you today? No Pain Assessment Patient in pain? no        Primary Care Provider:  Newt Lukes MD  CC:  3 month follow-up.  History of Present Illness:  here for 3 month f/u  1) ESRD - HD MWF - begun 09/2009 - no UOP continued hypotension since dc from hosp 01/02/10 a/w HD  s/p med changes for BP/card meds but not sig improved  2) DM2 - follows with endo for same - reports cbgs range variable, checks sugars 2-3 x/day- compliant with insulin as rx'd no signs or symptoms hypoglycemia  3) depression hx -  reports compliance with ongoing medical treatment - celexa denies adverse side effects related to current therapy.  no active depression signs or symptoms per pt and wife  4) CAD - cath 12/31/09 without sig occlusion of prior CABG grafts + near daily (5/7 d/wk) recurrent CP - has seen cards for same- relieved with NTG SL use  no syncope but severe dizziness (rekated to low BP, esp s/p HD) feels better since stopping amio    Current Medications (verified): 1)  Lipitor 20 Mg  Tabs (Atorvastatin Calcium) .... Take 1 By Mouth Qd 2)  Celexa 20 Mg Tabs (Citalopram Hydrobromide) .Marland Kitchen.. 1 Tab By Mouth Once Daily 3)  Unifine Pentips 31g X 8 Mm  Misc (Insulin Pen Needle) .... Use As Directed 4)  Humulin N Pen 100 Unit/ml Susp (Insulin Isophane Human) .... 70 Units At Bedtime 5)  Humalog Kwikpen 100 Unit/ml Soln (Insulin Lispro (Human)) .... 4 Times A Day: 30-20-40-15 Units 6)  Nexium 40 Mg Cpdr (Esomeprazole Magnesium) .... Take 1 Tablet By  Mouth Two Times A Day 7)  Bd U/f Short Pen Needle 31g X 8 Mm Misc (Insulin Pen Needle) .... Ue As Directed 8)  Adult Aspirin Low Strength 81 Mg Tbdp (Aspirin) .... Take 1 By Mouth Qd 9)  Ambien 5 Mg Tabs (Zolpidem Tartrate) .Marland Kitchen.. 1 By Mouth At Bedtime As Needed 10)  Phenergan 12.5 .... As Needed 11)  Zofran 4 Mg Tabs (Ondansetron Hcl) .... As Needed 12)  Calcium Acetate 667 Mg Caps (Calcium Acetate (Phos Binder)) .... Take 1 Cap Before Meals 13)  Rena-Vite  Tabs (B Complex-C-Folic Acid) .... Take 1 Tablet By Mouth Once A Day 14)  Hydroxyzine Hcl 25 Mg Tabs (Hydroxyzine Hcl) .... As Needed For Itching 15)  Imdur 60 Mg Xr24h-Tab (Isosorbide Mononitrate) .... Take 1 By Mouth Qd 16)  Nitroglycerin 0.4 Mg/hr Pt24 (Nitroglycerin) .... Use As Needed 17)  Carvedilol 25 Mg Tabs (Carvedilol) .Marland Kitchen.. 1 Two Times A Day 18)  Dialysis .... On Mon, Wed, and Fri 19)  Nepro  Liqd (Nutritional Supplements) .... Daily 20)  Vitamin From Dialysis Center .... As Directed 21)  Sarna 0.5-0.5 % Lotn (Camphor-Menthol) .... As Directed 22)  Cpap .... For Obstructive Sleep Apnea 23)  Glucagon  Emergency 1 Mg Kit (Glucagon (Rdna)) .... As Dir 24)  Stool Softener 100 Mg Caps (Docusate Sodium) .... As Needed 25)  Plavix 75 Mg Tabs (Clopidogrel Bisulfate) .... Take One Tablet By Mouth Daily 26)  Effient 10 Mg Tabs (Prasugrel Hcl) .... 30 Mg X 2 Doses Then 1 Once Daily 27)  Ranexa 500 Mg Xr12h-Tab (Ranolazine) .Marland Kitchen.. 1 Two Times A Day  Allergies (verified): 1)  ! Procardia 2)  ! Adhesive Tape  Past History:  Past Medical History: SLEEP APNEA, OBSTRUCTIVE - CPAP qHS ESRD - HD MWF    a/w anemia of chronic dz, hypotension HYPERTENSION hx DYSLIPIDEMIA GERD DIABETES MELLITUS, insulin dep   a/w peripheral neuropathy and gastroparesis CORONARY ARTERY DISEASE s/p CABG 2003 - cath 12/31/09 - occluded sap dx graft, otherwise patent grafts chronic syst CHF - LVEF 35-40% by cath 12/31/09 depression Vit B12 deficency normal  pressure hydrocephalus s/p VP shunt 08/2009  MD roster -  endo - ellison cards - [facility] - [facility] - patel nsurg - cram    Review of Systems  The patient denies fever, weight loss, chest pain, and abdominal pain.    Physical Exam  General:  alert, well-developed, well-nourished, and cooperative to examination.   spouse at side Lungs:  normal respiratory effort, no intercostal retractions or use of accessory muscles; normal breath sounds bilaterally - no crackles and no wheezes.    Heart:  normal rate (high end of normal), regular rhythm, no murmur, and no rub. BLE without edema. Psych:  Oriented X3, memory intact for recent and remote, normally interactive, good eye contact, not anxious appearing, not depressed appearing, and not agitated.      Impression & Recommendations:  Problem # 1:  CHRONIC SYSTOLIC HEART FAILURE (ICD-428.22)  His updated medication list for this problem includes:    Adult Aspirin Low Strength 81 Mg Tbdp (Aspirin) .Marland Kitchen... Take 1 by mouth qd    Carvedilol 25 Mg Tabs (Carvedilol) .Marland Kitchen... 1 two times a day    Plavix 75 Mg Tabs (Clopidogrel bisulfate) .Marland Kitchen... Take one tablet by mouth daily    Effient 10 Mg Tabs (Prasugrel hcl) .Marland KitchenMarland KitchenMarland KitchenMarland Kitchen 30 mg x 2 doses then 1 once daily  per EP 03/24/10: His symptoms are class 3.  I have discussed treatment options with the patient and his family.  He is a candidate for BiV PM but I carefully discussed the pro's and con's of this approach.  He is considering his options and will call us if he would like to proceed with device implant. at this time, no ICD d/t risk infx with HD needs (freq vasc access)  Echocardiogram: SUMMARY   -  Overall left ventricular systolic function was normal. Left         ventricular ejection fraction was estimated , range being 55         % to 65 %. There were no left ventricular regional wall         motion abnormalities. Left ventricular wall thickness was         mildly increased.  Doppler parameters were consistent with         high left ventricular filling pressure.   -  There was mild mitral annular calcification. There was mild         mitral valvular regurgitation.   -  The left atrium was mildly dilated.   -  The estimated peak pulmonary artery systolic pressure was mild to  moderately increased.   -  The right atrium was mildly dilated.     ---------------------------------------------------------------    Prepared and Electronically Authenticated by    Olga Millers M.D. Ejection Fraction:  >=50%.   (11/05/2007)  Problem # 2:  RENAL FAILURE, END STAGE (ICD-585.6)  on HD MWF since 09/2009 - reviewed problems a/w HD (such as hypotension and travel to HD visits) and need to coordinate labs  Labs Reviewed: BUN: 39 (01/01/2010)   Cr: 4.12 (01/01/2010)    Hgb: 13.7 (01/01/2010)   Hct: 39.8 (01/01/2010)   Ca++: 9.5 (01/01/2010)    TP: 7.5 (10/27/2008)   Alb: 3.6 (10/27/2008)  Problem # 3:  HYPERCHOLESTEROLEMIA (ICD-272.0)  His updated medication list for this problem includes:    Lipitor 20 Mg Tabs (Atorvastatin calcium) .Marland Kitchen... Take 1 by mouth qd  Orders: TLB-Lipid Panel ([id number])  Labs Reviewed: SGOT: 22 (10/27/2008)   SGPT: 6 (10/27/2008)  Problem # 4:  DIABETES MELLITUS, TYPE I (ICD-250.01) onoging mgmt per endo  - for a1c today His updated medication list for this problem includes:    Humulin N Pen 100 Unit/ml Susp (Insulin isophane human) .Marland KitchenMarland KitchenMarland KitchenMarland Kitchen 70 units at bedtime    Humalog Kwikpen 100 Unit/ml Soln (Insulin lispro (human)) .Marland KitchenMarland KitchenMarland KitchenMarland Kitchen 4 times a day: 30-20-40-15 units    Adult Aspirin Low Strength 81 Mg Tbdp (Aspirin) .Marland Kitchen... Take 1 by mouth qd    Glucagon Emergency 1 Mg Kit (Glucagon (rdna)) .Marland Kitchen... As dir  Complete Medication List: 1)  Lipitor 20 Mg Tabs (Atorvastatin calcium) .... Take 1 by mouth qd 2)  Celexa 20 Mg Tabs (Citalopram hydrobromide) .Marland Kitchen.. 1 tab by mouth once daily 3)  Unifine Pentips 31g X 8 Mm Misc (Insulin pen needle)  .... Use as directed 4)  Humulin N Pen 100 Unit/ml Susp (Insulin isophane human) .... 70 units at bedtime 5)  Humalog Kwikpen 100 Unit/ml Soln (Insulin lispro (human)) .... 4 times a day: 30-20-40-15 units 6)  Nexium 40 Mg Cpdr (Esomeprazole magnesium) .... Take 1 tablet by mouth two times a day 7)  Bd U/f Short Pen Needle 31g X 8 Mm Misc (Insulin pen needle) .... Ue as directed 8)  Adult Aspirin Low Strength 81 Mg Tbdp (Aspirin) .... Take 1 by mouth qd 9)  Ambien 5 Mg Tabs (Zolpidem tartrate) .Marland Kitchen.. 1 by mouth at bedtime as needed 10)  Phenergan 12.5  .... As needed 11)  Zofran 4 Mg Tabs (Ondansetron hcl) .... As needed 12)  Calcium Acetate 667 Mg Caps (Calcium acetate (phos binder)) .... Take 1 cap before meals 13)  Rena-vite Tabs (B complex-c-folic acid) .... Take 1 tablet by mouth once a day 14)  Hydroxyzine Hcl 25 Mg Tabs (Hydroxyzine hcl) .... As needed for itching 15)  Imdur 60 Mg Xr24h-tab (Isosorbide mononitrate) .... Take 1 by mouth qd 16)  Nitroglycerin 0.4 Mg/hr Pt24 (Nitroglycerin) .... Use as needed 17)  Carvedilol 25 Mg Tabs (Carvedilol) .Marland Kitchen.. 1 two times a day 18)  Dialysis  .... On mon, wed, and fri 69)  Nepro Liqd (Nutritional supplements) .... Daily 20)  Vitamin From Dialysis Center  .... As directed 21)  Sarna 0.5-0.5 % Lotn (Camphor-menthol) .... As directed 22)  Cpap  .... For obstructive sleep apnea 23)  Glucagon Emergency 1 Mg Kit (Glucagon (rdna)) .... As dir 24)  Stool Softener 100 Mg Caps (Docusate sodium) .... As needed 25)  Plavix 75 Mg Tabs (Clopidogrel bisulfate) .... Take one tablet by mouth daily 26)  Effient 10 Mg Tabs (Prasugrel hcl) .Marland KitchenMarland KitchenMarland Kitchen  30 mg x 2 doses then 1 once daily 27)  Ranexa 500 Mg Xr12h-tab (Ranolazine) .Marland Kitchen.. 1 two times a day  Other Orders: TLB-Hepatic/Liver Function Pnl ([id number])  Patient Instructions: 1)  it was good to see you today. 2)  test(s) ordered today - your results will be posted on the phone tree for review in 48-72 hours  from the time of test completion; call [phone] and enter your 9 digit MRN (listed above on this page, just below your name); if any changes need to be made or there are abnormal results, you will be contacted directly.  3)  talk with cardiology about possible medication dose reduction due to severe symptoms of low blood pressure 4)  Please schedule a follow-up appointment in 3 months, sooner if problems.

## 2011-01-26 NOTE — Assessment & Plan Note (Signed)
Summary: NEW MEDICARE PT--STC  SEEING SAE AT 10:45 /NWS   Vital Signs:  Patient profile:   77 year old male Height:      73 inches (185.42 cm) Weight:      185.8 pounds (84.45 kg) O2 Sat:      97 % on Room air Temp:     98.1 degrees F (36.72 degrees C) oral Pulse rate:   124 / minute BP sitting:   120 / 72  (right arm) Cuff size:   regular  Vitals Entered By: Orlan Leavens (January 18, 2010 9:46 AM)  O2 Flow:  Room air CC: New patient Is Patient Diabetic? Yes Did you bring your meter with you today? No Pain Assessment Patient in pain? no        Primary Care Provider:  Newt Lukes MD  CC:  New patient.  History of Present Illness: new pt to me and our division - here to est with PCP - prev followed with dr. Francesco Runner follows with others in our group including cards, GI and endo  1) ESRD - HD MWF - begun 09/2009 recent hypotension since dc from hosp 01/02/10 a/w HD causing med changes for BP/card meds spouse ?if feasible to do HD at home  2) DM2 - follows with endo for same - reports cbgs range variable, checks sugars 2-3 x/day- compliant with insulin as rx'd  3) HTN - med changes as noted in #1 above - reports compliance with ongoing medical treatment since that time. denies adverse side effects related to current therapy.   4) CAD - recent hosp last mo for CP - cath 12/31/09 without sig occlusion of prior CABG grafts + near daily (5/7 d/wk) recurrent CP since dc - relieved with NTG SL use seeing cards next 2 weeks for review of same and ?high HR - no syncope or dizziness  Clinical Review Panels:  Prevention   Last Colonoscopy:  Done (03/16/1997)  Immunizations   Last Tetanus Booster:  Historical (12/25/2008)   Last Flu Vaccine:  Historical (09/23/2009)   Last Pneumovax:  Historical (12/25/2008)  Diabetes Management   HgBA1C:  7.5 (07/28/2009)   Creatinine:  4.12 (01/01/2010)   Last Flu Vaccine:  Historical (09/23/2009)   Last Pneumovax:  Historical  (12/25/2008)  CBC   WBC:  7.7 (01/01/2010)   RBC:  4.40 (01/01/2010)   Hgb:  13.7 (01/01/2010)   Hct:  39.8 (01/01/2010)   Platelets:  111 (01/01/2010)   MCV  90.6 (01/01/2010)   MCHC  35.1 (10/27/2008)   RDW  17.5 (01/01/2010)   PMN:  70 (12/31/2009)   Lymphs:  12.7 (10/27/2008)   Monos:  8 (12/31/2009)   Eosinophils:  1 (12/31/2009)   Basophil:  1 (12/31/2009)  Complete Metabolic Panel   Glucose:  147 (01/01/2010)   Sodium:  138 (01/01/2010)   Potassium:  4.6 (01/01/2010)   Chloride:  96 (01/01/2010)   CO2:  28 (01/01/2010)   BUN:  39 (01/01/2010)   Creatinine:  4.12 (01/01/2010)   Albumin:  3.6 (10/27/2008)   Total Protein:  7.5 (10/27/2008)   Calcium:  9.5 (01/01/2010)   Total Bili:  1.2 (10/27/2008)   Alk Phos:  22 (10/27/2008)   SGPT (ALT):  6 (10/27/2008)   SGOT (AST):  22 (10/27/2008)   Current Medications (verified): 1)  Flomax 0.4 Mg  Cp24 (Tamsulosin Hcl) .... Take 1 On Non Dialysis Days 2)  Lipitor 20 Mg  Tabs (Atorvastatin Calcium) .... Take 1 By Mouth Qd 3)  Celexa 20 Mg Tabs (Citalopram Hydrobromide) .Marland Kitchen.. 1 Tab By Mouth Once Daily 4)  Unifine Pentips 31g X 8 Mm  Misc (Insulin Pen Needle) .... Use As Directed 5)  Humulin N Pen 100 Unit/ml Susp (Insulin Isophane Human) .... 75 Units At Bedtime, 6)  Humalog Kwikpen 100 Unit/ml Soln (Insulin Lispro (Human)) .... Qac (Three Times A Day) 25-20-40 Units 7)  Nexium 40 Mg Cpdr (Esomeprazole Magnesium) .... Take 1 Tablet By Mouth Two Times A Day 8)  Bd U/f Short Pen Needle 31g X 8 Mm Misc (Insulin Pen Needle) .... Ue As Directed 9)  Adult Aspirin Low Strength 81 Mg Tbdp (Aspirin) .... Take 1 By Mouth Qd 10)  Ambien .... As Needed 11)  Phenergan 12.5 .... As Needed 12)  Zofran 4 Mg Tabs (Ondansetron Hcl) .... As Needed 13)  Phengrgan Suppositories .... As Needed 14)  Calcium Acetate 667 Mg Caps (Calcium Acetate (Phos Binder)) .... Take 1 Cap Before Meals 15)  Rena-Vite  Tabs (B Complex-C-Folic Acid) .... Take 1  Tablet By Mouth Once A Day 16)  Hydroxyzine Hcl 25 Mg Tabs (Hydroxyzine Hcl) .... As Needed For Itching 17)  Oxycodone-Acetaminophen 5-500 Mg Caps (Oxycodone-Acetaminophen) .... As Needed 18)  Imdur 60 Mg Xr24h-Tab (Isosorbide Mononitrate) .... Take 1 By Mouth Qd 19)  Nitroglycerin 0.4 Mg/hr Pt24 (Nitroglycerin) .... Use As Needed 20)  Coreg 3.125 Mg Tabs (Carvedilol) .... Take 1 Two Times A Day 21)  Dialysis .... On Mon, Wed, and Fri  Allergies (verified): 1)  ! Procardia 2)  ! Adhesive Tape  Past History:  Past Medical History: SLEEP APNEA, OBSTRUCTIVE - CPAP qHS ESRD - HD MWF    a/w anemia of chronic dz HYPERTENSION  DYSLIPIDEMIA GERD DIABETES MELLITUS, insulin dep   a/w peripheral neuropathy and gastroparesis CORONARY ARTERY DISEASE s/p CABG 2003 - cath 12/31/09 - occluded sap dx graft, otherwise patent grafts chronic syst CHF - LVEF 35-40% by cath 12/31/09 depression Vit B12 deficency normal pressure hydrocephalus s/p VP shunt 08/2009  MD rooster -  endo - ellison cards - [facility] - [facility] - [facility]    Past Surgical History: CORONARY ARTERY BYPASS GRAFT, THREE VESSEL, HX OF (2003) CATARACT EXTRACTIONS, BILATERAL, HX OF (ICD-V45.61)  Family History: Reviewed history from 03/27/2009 and no changes required. No FH of Colon Cancer: Stomach cancer: Paternal Aunt Family History of Diabetes: Brother, Father Father:Died at age 66 Lung Cancer Mother:Died at age 27 of a stroke  Social History: Patient has never smoked. Alcohol Use - no Illicit Drug Use - no Patient does not get regular exercise.  Pt is on dialysis - MWF - since 09/2009 married, lives with spouse, supportive dtr and son nearby  Review of Systems       see HPI above. I have reviewed all other systems and they were negative.   Physical Exam  General:  alert, well-developed, well-nourished, and cooperative to examination.   spouse at side Eyes:  vision grossly intact; pupils equal,  round and reactive to light.  conjunctiva and lids normal.    Lungs:  normal respiratory effort, no intercostal retractions or use of accessory muscles; normal breath sounds bilaterally - no crackles and no wheezes.    Heart:  normal rate (high end of normal), regular rhythm, no murmur, and no rub. BLE without edema. Msk:  No deformity or scoliosis noted of thoracic or lumbar spine.   Neurologic:  alert & oriented X3 and cranial nerves II-XII symetrically intact.  strength normal  in all extremities, sensation intact to light touch, and gait normal. speech fluent without dysarthria or aphasia; follows commands with good comprehension.  Skin:  mild brusing over LUE AVG Psych:  Oriented X3, memory intact for recent and remote, normally interactive, good eye contact, not anxious appearing, not depressed appearing, and not agitated.      Impression & Recommendations:  Problem # 1:  RENAL FAILURE, END STAGE (ICD-585.6) on HD MWF since 09/2009 - reviewed problems a/w HD (such as hypotension and travel to HD visits) and need to coordinate labs  Time spent with patient/spouse 40+ minutes, more than 50% of this time was spent counseling patient on coordination of care with PCP and multiple speacialists, reviewing recent hospitalizations and current medications/side effects, and problems concerning the need for ongoing communication and frequent visits to review current medical state.  Problem # 2:  DIABETES MELLITUS, TYPE I, UNCONTROLLED (ICD-250.03) mgmt as ongoing by endo ER eval x 1 for hypoglycemia since last OV reviewed  His updated medication list for this problem includes:    Humulin N Pen 100 Unit/ml Susp (Insulin isophane human) .Marland KitchenMarland KitchenMarland KitchenMarland Kitchen 75 units at bedtime,    Humalog Kwikpen 100 Unit/ml Soln (Insulin lispro (human)) ..... Qac (three times a day) 25-20-40 units    Adult Aspirin Low Strength 81 Mg Tbdp (Aspirin) .Marland Kitchen... Take 1 by mouth qd  Labs Reviewed: Creat: 4.12 (01/01/2010)    Reviewed  HgBA1c results: 7.5 (07/28/2009)  7.2 (04/19/2009)  Problem # 3:  HYPERTENSION (ICD-401.9)  The following medications were removed from the medication list:    Norvasc 10 Mg Tabs (Amlodipine besylate) .Marland Kitchen... Take 1 by mouth once daily at bedtime    Toprol Xl 100 Mg Tb24 (Metoprolol succinate) .Marland Kitchen... Take 1 by mouth qd His updated medication list for this problem includes:    Coreg 3.125 Mg Tabs (Carvedilol) .Marland Kitchen... Take 1 two times a day  BP today: 120/72 Prior BP: 142/70 (12/28/2009)  Labs Reviewed: K+: 4.6 (01/01/2010) Creat: : 4.12 (01/01/2010)     Problem # 4:  HYPERCHOLESTEROLEMIA (ICD-272.0)  His updated medication list for this problem includes:    Lipitor 20 Mg Tabs (Atorvastatin calcium) .Marland Kitchen... Take 1 by mouth qd  Labs Reviewed: SGOT: 22 (10/27/2008)   SGPT: 6 (10/27/2008)  Problem # 5:  CORONARY ARTERY DISEASE (ICD-414.00) ongoing near daily use of NTG SL - ?inc Imdur dose - defer to cards cath report from 12/31/09 reviewed  The following medications were removed from the medication list:    Norvasc 10 Mg Tabs (Amlodipine besylate) .Marland Kitchen... Take 1 by mouth once daily at bedtime    Toprol Xl 100 Mg Tb24 (Metoprolol succinate) .Marland Kitchen... Take 1 by mouth qd His updated medication list for this problem includes:    Adult Aspirin Low Strength 81 Mg Tbdp (Aspirin) .Marland Kitchen... Take 1 by mouth qd    Imdur 60 Mg Xr24h-tab (Isosorbide mononitrate) .Marland Kitchen... Take 1 by mouth qd    Nitroglycerin 0.4 Mg/hr Pt24 (Nitroglycerin) ..... Use as needed    Coreg 3.125 Mg Tabs (Carvedilol) .Marland Kitchen... Take 1 two times a day  Problem # 6:  DEPRESSION (ICD-311)  His updated medication list for this problem includes:    Celexa 20 Mg Tabs (Citalopram hydrobromide) .Marland Kitchen... 1 tab by mouth once daily    Hydroxyzine Hcl 25 Mg Tabs (Hydroxyzine hcl) .Marland Kitchen... As needed for itching  Problem # 7:  GERD (ICD-530.81) on max PPI - ?if this contrib to near daily CP symptoms relieved by NTG since recent cath clean  His updated  medication list for this problem includes:    Nexium 40 Mg Cpdr (Esomeprazole magnesium) .Marland Kitchen... Take 1 tablet by mouth two times a day  Complete Medication List: 1)  Flomax 0.4 Mg Cp24 (Tamsulosin hcl) .... Take 1 on non dialysis days 2)  Lipitor 20 Mg Tabs (Atorvastatin calcium) .... Take 1 by mouth qd 3)  Celexa 20 Mg Tabs (Citalopram hydrobromide) .Marland Kitchen.. 1 tab by mouth once daily 4)  Unifine Pentips 31g X 8 Mm Misc (Insulin pen needle) .... Use as directed 5)  Humulin N Pen 100 Unit/ml Susp (Insulin isophane human) .... 75 units at bedtime, 6)  Humalog Kwikpen 100 Unit/ml Soln (Insulin lispro (human)) .... Qac (three times a day) 25-20-40 units 7)  Nexium 40 Mg Cpdr (Esomeprazole magnesium) .... Take 1 tablet by mouth two times a day 8)  Bd U/f Short Pen Needle 31g X 8 Mm Misc (Insulin pen needle) .... Ue as directed 9)  Adult Aspirin Low Strength 81 Mg Tbdp (Aspirin) .... Take 1 by mouth qd 10)  Ambien  .... As needed 11)  Phenergan 12.5  .... As needed 12)  Zofran 4 Mg Tabs (Ondansetron hcl) .... As needed 13)  Calcium Acetate 667 Mg Caps (Calcium acetate (phos binder)) .... Take 1 cap before meals 14)  Rena-vite Tabs (B complex-c-folic acid) .... Take 1 tablet by mouth once a day 15)  Hydroxyzine Hcl 25 Mg Tabs (Hydroxyzine hcl) .... As needed for itching 16)  Oxycodone-acetaminophen 5-500 Mg Caps (Oxycodone-acetaminophen) .... As needed 17)  Imdur 60 Mg Xr24h-tab (Isosorbide mononitrate) .... Take 1 by mouth qd 18)  Nitroglycerin 0.4 Mg/hr Pt24 (Nitroglycerin) .... Use as needed 19)  Coreg 3.125 Mg Tabs (Carvedilol) .... Take 1 two times a day 20)  Dialysis  .... On mon, wed, and fri  Patient Instructions: 1)  it was good to see you today.  2)  we have reviewed the current state of your medical problems including recent hospitalizations and tests and medications - 3)  please ask dialysis center to include our office with faxed copies of lab data when available so we can incorportae  this into our records - 4)  discuss with dr. Eden Emms regarding possible increase in imdur if tolerated by your blood pressures and possible change in medications for your heart rate control as needed  5)  Please schedule a follow-up appointment in 3-4 months, sooner if problems.    Immunization History:  Tetanus/Td Immunization History:    Tetanus/Td:  historical (12/25/2008)  Pneumovax Immunization History:    Pneumovax:  historical (12/25/2008)

## 2011-01-26 NOTE — Procedures (Signed)
Summary: wound check   Current Medications (verified): 1)  Lipitor 20 Mg  Tabs (Atorvastatin Calcium) .... Take 1 By Mouth Qd 2)  Celexa 20 Mg Tabs (Citalopram Hydrobromide) .Marland Kitchen.. 1 Tab By Mouth Once Daily 3)  Unifine Pentips 31g X 8 Mm  Misc (Insulin Pen Needle) .... Use As Directed 4)  Humulin N Pen 100 Unit/ml Susp (Insulin Isophane Human) .... 70 Units At Bedtime 5)  Humalog Kwikpen 100 Unit/ml Soln (Insulin Lispro (Human)) .... 4 Times A Day: 30-20-40-15 Units 6)  Nexium 40 Mg Cpdr (Esomeprazole Magnesium) .... Take 1 Tablet By Mouth Two Times A Day 7)  Bd U/f Short Pen Needle 31g X 8 Mm Misc (Insulin Pen Needle) .... Ue As Directed 8)  Adult Aspirin Low Strength 81 Mg Tbdp (Aspirin) .... Take 1 By Mouth Qd 9)  Ambien 5 Mg Tabs (Zolpidem Tartrate) .Marland Kitchen.. 1 By Mouth At Bedtime As Needed 10)  Phenergan 12.5 .... As Needed 11)  Zofran 4 Mg Tabs (Ondansetron Hcl) .... As Directed 12)  Calcium Acetate 667 Mg Caps (Calcium Acetate (Phos Binder)) .... Take 2 Cap Before Meals 13)  Rena-Vite  Tabs (B Complex-C-Folic Acid) .... Take 1 Tablet By Mouth Once A Day 14)  Hydroxyzine Hcl 25 Mg Tabs (Hydroxyzine Hcl) .... Morning of Dialysis 15)  Imdur 60 Mg Xr24h-Tab (Isosorbide Mononitrate) .... Take 1 By Mouth Qd 16)  Nitroglycerin 0.4 Mg/hr Pt24 (Nitroglycerin) .... Use As Needed 17)  Dialysis .... On Mon, Wed, and Fri 18)  Nepro  Liqd (Nutritional Supplements) .... Daily 19)  Vitamin From Dialysis Center .... As Directed 20)  Sarna 0.5-0.5 % Lotn (Camphor-Menthol) .... As Directed 21)  Cpap .... For Obstructive Sleep Apnea 22)  Glucagon Emergency 1 Mg Kit (Glucagon (Rdna)) .... As Dir 23)  Stool Softener 100 Mg Caps (Docusate Sodium) .... As Needed 24)  Effient 10 Mg Tabs (Prasugrel Hcl) .... 30 Mg X 2 Doses Then 1 Once Daily 25)  Ranexa 500 Mg Xr12h-Tab (Ranolazine) .... Tale 1 By Mouth Once Daily 26)  Tylenol 325 Mg Tabs (Acetaminophen) .... As Needed 27)  Carvedilol 12.5 Mg Tabs (Carvedilol)  .... Take 1 Two Times A Day  Allergies (verified): 1)  ! Procardia 2)  ! Adhesive Tape   ICD Specifications Following MD:  Lewayne Bunting, MD     ICD Vendor:  [employer]     ICD Model Number:  [id number]     ICD Serial Number:  [id number] ICD DOI:  08/16/2010     ICD Implanting MD:  Lewayne Bunting, MD  Lead 1:    Location: RA     DOI: 08/16/2010     Model #: [id number]     Serial #: [id number]     Status: active Lead 2:    Location: RV     DOI: 08/16/2010     Model #: [id number]     Serial #: [id number]     Status: active Lead 3:    Location: LV     DOI: 08/16/2010     Model #: [id number]     Serial #: [id number]     Status: active  Indications::  ICM   ICD Follow Up Remote Check?  No Battery Voltage:  3.22 V     Charge Time:  8.8 seconds     Underlying rhythm:  ST ICD Dependent:  Yes       ICD Device Measurements Atrium:  Amplitude: 4.0 mV, Impedance: 494 ohms, Threshold: 0.75 V at 0.4 msec Right Ventricle:  Amplitude: 17.6 mV, Impedance: 570 ohms, Threshold: 0.5 V at 0.4 msec Left Ventricle:  Impedance: 627 ohms, Threshold: 0.75 V at 0.4 msec Shock Impedance: 46/61 ohms   Episodes MS Episodes:  0     Percent Mode Switch:  0     Coumadin:  No Shock:  0     ATP:  0     Nonsustained:  0     Atrial Pacing:  0%     Ventricular Pacing:  99.7%  Brady Parameters Mode ddd     Lower Rate Limit:  50     Upper Rate Limit 130 PAV 200     Sensed AV Delay:  160  Tachy Zones VF:  200     VT:  176     Next Cardiology Appt Due:  11/24/2010 Tech Comments:  Resting heart rate today ST@ 111bpm.  Steri strips removed, no redness or edema noted.  Lead impedance alert values reprogrammed.  Device function normal.  ROV 3 months with Dr. Ladona Ridgel in [place]. Altha Harm, LPN  September 01, 2010 5:09 PM

## 2011-01-26 NOTE — Letter (Signed)
Summary: Diabetic Supplies/[facility]  Diabetic Supplies/[facility]   Imported By: Sherian Rein 01/24/2010 15:07:06  _____________________________________________________________________  External Attachment:    Type:   Image     Comment:   External Document

## 2011-01-26 NOTE — Letter (Signed)
Summary: Response/[facility]  Response/[facility]   Imported By: Lester [place] 03/22/2010 11:03:14  _____________________________________________________________________  External Attachment:    Type:   Image     Comment:   External Document

## 2011-01-26 NOTE — Progress Notes (Signed)
Summary: Nexium auth  Phone Note Call from Patient Call back at [phone]  (daughter's cell)   Caller: daughter, Eunice Blase Call For: Dr. Jarold Motto Reason for Call: Talk to Nurse Summary of Call: waiting on authorization from Dr. Jarold Motto for Nexium... have been waiting 1 week Initial call taken by: Vallarie Mare,  January 24, 2010 10:23 AM  Follow-up for Phone Call        Awaiting fax from [employer]. Ashok Cordia RN  January 24, 2010 10:57 AM  Spoke with dtr.  States pt will be getting nexium from [facility] in [place].  Will no longer use mail order.  Form has been requested from [employer] re prior aurthorization.   Follow-up by: Ashok Cordia RN,  January 24, 2010 4:45 PM  Additional Follow-up for Phone Call Additional follow up Details #1::        Would like to stop by today to pick up some Nexium samples to hold him over. Will be in [place] this afternoon. Additional Follow-up by: Leanor Kail [facility],  January 25, 2010 8:30 AM    Additional Follow-up for Phone Call Additional follow up Details #2::    Nexium approved until 01/25/11.  Samples left at front for pt.  Left message for dtr. Follow-up by: Ashok Cordia RN,  January 25, 2010 1:57 PM

## 2011-01-26 NOTE — Letter (Signed)
Summary: Consult/[place]  Consult/[place]   Imported By: Sherian Rein 08/25/2010 13:32:22  _____________________________________________________________________  External Attachment:    Type:   Image     Comment:   External Document

## 2011-01-26 NOTE — Letter (Signed)
Summary: CMN/[facility]  CMN/[facility]   Imported By: Lester [place] 01/28/2010 07:23:15  _____________________________________________________________________  External Attachment:    Type:   Image     Comment:   External Document

## 2011-01-26 NOTE — Assessment & Plan Note (Signed)
Summary: eph/jss   Referring Provider:  baxley Primary Provider:  Sharlet Salina, [name]  CC:  chest pain and sob also dizziness.  History of Present Illness: [name] is seen today for F/U of CAD with distant CABG.  He was just cathed by Dr Juanda Chance and had an occluded SVG to diagonal with LIMA and all other grafts patient.  He continues to have SSCP.  He is taking a lot of nitro.  His filling pressures were high at cath but his is unable to tolerate a lower dry wight at dialysis due to hypotension.  He has been relatively tachycardic.  He is able to sleep with limited lower extremity edema.  I am not sure why he should be having SSCP since his SVG was totally occluded and the remainder of his anatomy was stable.  there's been no evidence of pericarditis renal or post MI>  He has been anemic but I dont have recent labs.  He is back on epogen per reanl doctros.  His fistula is only a month old and this may be contributing to symptoms via increased cardiac output.    Current Problems (verified): 1)  Malignant Neoplasm, Skin, Face  (ICD-173.3) 2)  Renal Dialysis Status  (ICD-V45.11) 3)  Renal Failure, End Stage  (ICD-585.6) 4)  Carotid Bruit, Right  (ICD-785.9) 5)  Coronary Artery Bypass Graft, Three Vessel, Hx of  (ICD-V45.81) 6)  Vitamin B12 Deficiency  (ICD-266.2) 7)  Peripheral Neuropathy  (ICD-356.9) 8)  Hx of Right Wrist Surgery.  () 9)  Cataract Extractions, Bilateral, Hx of  (ICD-V45.61) 10)  Sleep Apnea, Obstructive  (ICD-327.23) 11)  Congestive Heart Failure, Hx of  (ICD-V12.50) 12)  Gastroparesis  (ICD-536.3) 13)  Hx of Erosive Gastritis  (ICD-535.40) 14)  Diabetes Mellitus, Type I, Uncontrolled  (ICD-250.03) 15)  Anemia, Macrocytic  (ICD-281.9) 16)  Parkinson's Disease  (ICD-332.0) 17)  Hypercholesterolemia  (ICD-272.0) 18)  Hypertension  (ICD-401.9) 19)  Gerd  (ICD-530.81) 20)  Diabetes Mellitus, Type I  (ICD-250.01) 21)  Depression  (ICD-311) 22)  Coronary Artery Disease   (ICD-414.00) 23)  Colonic Polyps, Hx of  (ICD-V12.72)  Current Medications (verified): 1)  Flomax 0.4 Mg  Cp24 (Tamsulosin Hcl) .... Take 1 On Non Dialysis Days 2)  Lipitor 20 Mg  Tabs (Atorvastatin Calcium) .... Take 1 By Mouth Qd 3)  Celexa 20 Mg Tabs (Citalopram Hydrobromide) .Marland Kitchen.. 1 Tab By Mouth Once Daily 4)  Unifine Pentips 31g X 8 Mm  Misc (Insulin Pen Needle) .... Use As Directed 5)  Humulin N Pen 100 Unit/ml Susp (Insulin Isophane Human) .... 75 Units At Bedtime, 6)  Humalog Kwikpen 100 Unit/ml Soln (Insulin Lispro (Human)) .... Qac (Three Times A Day) 30-20-40 Units 7)  Nexium 40 Mg Cpdr (Esomeprazole Magnesium) .... Take 1 Tablet By Mouth Two Times A Day 8)  Bd U/f Short Pen Needle 31g X 8 Mm Misc (Insulin Pen Needle) .... Ue As Directed 9)  Adult Aspirin Low Strength 81 Mg Tbdp (Aspirin) .... Take 1 By Mouth Qd 10)  Ambien .... As Needed 11)  Phenergan 12.5 .... As Needed 12)  Zofran 4 Mg Tabs (Ondansetron Hcl) .... As Needed 13)  Calcium Acetate 667 Mg Caps (Calcium Acetate (Phos Binder)) .... Take 1 Cap Before Meals 14)  Rena-Vite  Tabs (B Complex-C-Folic Acid) .... Take 1 Tablet By Mouth Once A Day 15)  Hydroxyzine Hcl 25 Mg Tabs (Hydroxyzine Hcl) .... As Needed For Itching 16)  Oxycodone-Acetaminophen 5-500 Mg Caps (Oxycodone-Acetaminophen) .... As Needed  17)  Imdur 60 Mg Xr24h-Tab (Isosorbide Mononitrate) .... Take 1 By Mouth Qd 18)  Nitroglycerin 0.4 Mg/hr Pt24 (Nitroglycerin) .... Use As Needed 19)  Carvedilol 12.5 Mg Tabs (Carvedilol) .... Take One Tablet By Mouth Twice A Day 20)  Dialysis .... On Mon, Wed, and Fri 21)  Nepro  Liqd (Nutritional Supplements) .... Daily 22)  Vitamin From Dialysis Center .... As Directed 23)  Sarna 0.5-0.5 % Lotn (Camphor-Menthol) .... As Directed  Allergies (verified): 1)  ! Procardia 2)  ! Adhesive Tape  Past History:  Past Medical History: Last updated: 01/18/2010 SLEEP APNEA, OBSTRUCTIVE - CPAP qHS ESRD - HD MWF    a/w anemia  of chronic dz HYPERTENSION  DYSLIPIDEMIA GERD DIABETES MELLITUS, insulin dep   a/w peripheral neuropathy and gastroparesis CORONARY ARTERY DISEASE s/p CABG 2003 - cath 12/31/09 - occluded sap dx graft, otherwise patent grafts chronic syst CHF - LVEF 35-40% by cath 12/31/09 depression Vit B12 deficency normal pressure hydrocephalus s/p VP shunt 08/2009  [name] rooster -  endo - ellison cards - [facility] - [facility] - patel    Past Surgical History: Last updated: 01/18/2010 CORONARY ARTERY BYPASS GRAFT, THREE VESSEL, HX OF (2003) CATARACT EXTRACTIONS, BILATERAL, HX OF (ICD-V45.61)  Family History: Last updated: 03/27/2009 No FH of Colon Cancer: Stomach cancer: Paternal Aunt Family History of Diabetes: Brother, Father Father:Died at age 65 Lung Cancer Mother:Died at age 64 of a stroke  Social History: Last updated: 01/18/2010 Patient has never smoked. Alcohol Use - no Illicit Drug Use - no Patient does not get regular exercise.  Pt is on dialysis - MWF - since 09/2009 married, lives with spouse, supportive dtr and son nearby  Review of Systems       Denies fever, malais, weight loss, blurry vision, decreased visual acuity, cough, sputum,  hemoptysis, pleuritic pain, palpitaitons, heartburn, abdominal pain, melena, lower extremity edema, claudication, or rash. All other systems reviewed and negative  Vital Signs:  Patient profile:   77 year old male Height:      73 inches Weight:      185 pounds BMI:     24.50 Pulse rate:   117 / minute Resp:     12 per minute BP sitting:   95 / 58  (left arm)  Vitals Entered By: Kem Parkinson (February 01, 2010 11:34 AM)  Physical Exam  General:  Affect appropriate Healthy:  appears stated age HEENT: normal Neck supple with no adenopathy JVP normal no bruits no thyromegaly Lungs clear with no wheezing and good diaphragmatic motion Heart:  S1/S2 no murmur,rub, gallop or click PMI normal Abdomen: benighn, BS  positve, no tenderness, no AAA no bruit.  No HSM or HJR Distal pulses intact with no bruits No edema Neuro non-focal Skin warm and dry fisuta with thrill in left arm   Impression & Recommendations:  Problem # 1:  CORONARY ARTERY BYPASS GRAFT, THREE VESSEL, HX OF (ICD-V45.81) Recent SEMI swith persistant pain.  Check echo R/O pericardial effusion and reassess EF  Problem # 2:  HYPERCHOLESTEROLEMIA (ICD-272.0) At goal lab work in 6 months His updated medication list for this problem includes:    Lipitor 20 Mg Tabs (Atorvastatin calcium) .Marland Kitchen... Take 1 by mouth qd  Problem # 3:  TACHYCARDIA (ICD-785.0) Start coreg 12.5 two times a day. Renal to maximize weight and Hct.  Avoid florinef and midrin if posibble Echo to R/O effusoin  Other Orders: Echocardiogram (Echo)  Patient Instructions: 1)  Your physician  recommends that you schedule a follow-up appointment in: 4 WEEKS 2)  Your physician has recommended you make the following change in your medication: INCREASE CARVEDILOL 12.5MG  ONE TABLET TWICE DAILY 3)  Your physician has requested that you have an echocardiogram.  Echocardiography is a painless test that uses sound waves to create images of your heart. It provides your doctor with information about the size and shape of your heart and how well your heart's chambers and valves are working.  This procedure takes approximately one hour. There are no restrictions for this procedure. Prescriptions: NITROGLYCERIN 0.4 MG/HR PT24 (NITROGLYCERIN) use as needed  #25 x PRN   Entered by:   Deliah Goody, RN   Authorized by:   Colon Branch, [name], [facility]   Signed by:   Deliah Goody, RN on 02/01/2010   Method used:   Electronically to        [facility] [address] [phone]* (retail)       [address] [place], [place]  [address]       Ph: [phone]       Fax: [phone]   RxID:   [id number] CARVEDILOL 12.5 MG TABS (CARVEDILOL) Take one tablet by mouth twice a day  #60 x 12    Entered by:   Deliah Goody, RN   Authorized by:   Colon Branch, [name], [facility]   Signed by:   Deliah Goody, RN on 02/01/2010   Method used:   Electronically to        [facility] [address] [phone]* (retail)       [address] [place], [place]  [address]       Ph: [phone]       Fax: [phone]   RxID:   [id number]    EKG Report  Procedure date:  02/01/2010  Findings:      Sinus tachycardia 118 LBBB

## 2011-01-26 NOTE — Assessment & Plan Note (Signed)
Summary: EPH   Referring Provider:  baxley Primary Provider:  Sharlet Salina, MD  CC:  pt complains of chest pain. Pt takes nitro which helps. Severity of pain 5 or 6. Pt states a PA at the kidney center wants him to be completely off the coreg.  History of Present Illness: Chronically ill dialysis patient with severe depression.  CAD with old CABG.  Cath x 2 this year already in January by Dr Juanda Chance and May be Dr Allyson Sabal.  SEMI 2nd cath.  Medical Rx recommended both times.  Havining angina with exertion every 2-3 days.  None during dialysis.  Always relieved with one nitro.  No SOB, palpitaiotns or syncope.  EF 30-35%/.  Previously evaluated by Dr Ladona Ridgel for AICD but put off due to ? risk of infection with dialysis and comorbidities.  ECG today with some changes.  Normally has LBBB pattern.  Today wit tachycardia, RBBB Inferolateral ST changes Jpoint elevation in V1.  Discussed hospitalization and cath with patient.  He thinks he is ok and would like to try to adjust meds.  His wife wants him to go to [facility] this weekend to help with his depression.  Since he feels fine now and has never needed more than one nitro with try to optimize meds and follow in office closely.  He also has contact with nurses during dialysis.  Since he has an ischemic DCM with EF 30-35% with increasing trigger for VT being angina will have him re-evaluatted by Dr Ladona Ridgel.  Current Problems (verified): 1)  Chronic Systolic Heart Failure  (ICD-428.22) 2)  Tachycardia  (ICD-785.0) 3)  Malignant Neoplasm, Skin, Face  (ICD-173.3) 4)  Renal Dialysis Status  (ICD-V45.11) 5)  Renal Failure, End Stage  (ICD-585.6) 6)  Carotid Bruit, Right  (ICD-785.9) 7)  Coronary Artery Bypass Graft, Three Vessel, Hx of  (ICD-V45.81) 8)  Vitamin B12 Deficiency  (ICD-266.2) 9)  Peripheral Neuropathy  (ICD-356.9) 10)  Hx of Right Wrist Surgery.  () 11)  Cataract Extractions, Bilateral, Hx of  (ICD-V45.61) 12)  Sleep Apnea, Obstructive   (ICD-327.23) 13)  Congestive Heart Failure, Hx of  (ICD-V12.50) 14)  Gastroparesis  (ICD-536.3) 15)  Hx of Erosive Gastritis  (ICD-535.40) 16)  Diabetes Mellitus, Type I, Uncontrolled  (ICD-250.03) 17)  Anemia, Macrocytic  (ICD-281.9) 18)  Parkinson's Disease  (ICD-332.0) 19)  Hypercholesterolemia  (ICD-272.0) 20)  Hypertension  (ICD-401.9) 21)  Gerd  (ICD-530.81) 22)  Diabetes Mellitus, Type I  (ICD-250.01) 23)  Depression  (ICD-311) 24)  Coronary Artery Disease  (ICD-414.00) 25)  Colonic Polyps, Hx of  (ICD-V12.72)  Current Medications (verified): 1)  Lipitor 20 Mg  Tabs (Atorvastatin Calcium) .... Take 1 By Mouth Qd 2)  Celexa 20 Mg Tabs (Citalopram Hydrobromide) .Marland Kitchen.. 1 Tab By Mouth Once Daily 3)  Unifine Pentips 31g X 8 Mm  Misc (Insulin Pen Needle) .... Use As Directed 4)  Humulin N Pen 100 Unit/ml Susp (Insulin Isophane Human) .... 75 Units At Bedtime 5)  Humalog Kwikpen 100 Unit/ml Soln (Insulin Lispro (Human)) .... 4 Times A Day: 30-20-40-10 Units 6)  Nexium 40 Mg Cpdr (Esomeprazole Magnesium) .... Take 1 Tablet By Mouth Two Times A Day 7)  Bd U/f Short Pen Needle 31g X 8 Mm Misc (Insulin Pen Needle) .... Ue As Directed 8)  Adult Aspirin Low Strength 81 Mg Tbdp (Aspirin) .... Take 1 By Mouth Qd 9)  Ambien 5 Mg Tabs (Zolpidem Tartrate) .Marland Kitchen.. 1 By Mouth At Bedtime As Needed 10)  Phenergan  12.5 .... As Needed 11)  Zofran 4 Mg Tabs (Ondansetron Hcl) .... As Needed 12)  Calcium Acetate 667 Mg Caps (Calcium Acetate (Phos Binder)) .... Take 1 Cap Before Meals 13)  Rena-Vite  Tabs (B Complex-C-Folic Acid) .... Take 1 Tablet By Mouth Once A Day 14)  Hydroxyzine Hcl 25 Mg Tabs (Hydroxyzine Hcl) .... As Needed For Itching 15)  Imdur 60 Mg Xr24h-Tab (Isosorbide Mononitrate) .... Take 1 By Mouth Qd 16)  Nitroglycerin 0.4 Mg/hr Pt24 (Nitroglycerin) .... Use As Needed 17)  Carvedilol 25 Mg Tabs (Carvedilol) .Marland Kitchen.. 1 Two Times A Day 18)  Dialysis .... On Mon, Wed, and Fri 19)  Nepro  Liqd  (Nutritional Supplements) .... Daily 20)  Vitamin From Dialysis Center .... As Directed 21)  Sarna 0.5-0.5 % Lotn (Camphor-Menthol) .... As Directed 22)  Cpap .... For Obstructive Sleep Apnea 23)  Glucagon Emergency 1 Mg Kit (Glucagon (Rdna)) .... As Dir 24)  Stool Softener 100 Mg Caps (Docusate Sodium) .... As Needed 25)  Plavix 75 Mg Tabs (Clopidogrel Bisulfate) .... Take One Tablet By Mouth Daily 26)  Effient 10 Mg Tabs (Prasugrel Hcl) .... 30 Mg X 2 Doses Then 1 Once Daily 27)  Ranexa 500 Mg Xr12h-Tab (Ranolazine) .Marland Kitchen.. 1 Two Times A Day  Allergies (verified): 1)  ! Procardia 2)  ! Adhesive Tape  Past History:  Past Medical History: Last updated: 04/26/2010 SLEEP APNEA, OBSTRUCTIVE - CPAP qHS ESRD - HD MWF    a/w anemia of chronic dz HYPERTENSION  DYSLIPIDEMIA GERD DIABETES MELLITUS, insulin dep   a/w peripheral neuropathy and gastroparesis CORONARY ARTERY DISEASE s/p CABG 2003 - cath 12/31/09 - occluded sap dx graft, otherwise patent grafts chronic syst CHF - LVEF 35-40% by cath 12/31/09 depression Vit B12 deficency normal pressure hydrocephalus s/p VP shunt 08/2009  MD rooster -  endo - ellison cards - [facility] - [facility] - patel nsurg - cram    Past Surgical History: Last updated: 01/18/2010 CORONARY ARTERY BYPASS GRAFT, THREE VESSEL, HX OF (2003) CATARACT EXTRACTIONS, BILATERAL, HX OF (ICD-V45.61)  Family History: Last updated: 03/27/2009 No FH of Colon Cancer: Stomach cancer: Paternal Aunt Family History of Diabetes: Brother, Father Father:Died at age 5 Lung Cancer Mother:Died at age 1 of a stroke  Social History: Last updated: 01/18/2010 Patient has never smoked. Alcohol Use - no Illicit Drug Use - no Patient does not get regular exercise.  Pt is on dialysis - MWF - since 09/2009 married, lives with spouse, supportive dtr and son nearby  Review of Systems       Denies fever, malais, weight loss, blurry vision, decreased  visual acuity, cough, sputum, SOB, hemoptysis, pleuritic pain, palpitaitons, heartburn, abdominal pain, melena, lower extremity edema, claudication, or rash.   Vital Signs:  Patient profile:   77 year old male Height:      73 inches Weight:      198 pounds BMI:     26.22 Pulse rate:   101 / minute Resp:     12 per minute BP sitting:   100 / 60  (left arm)  Vitals Entered By: Kem Parkinson (July 07, 2010 4:22 PM)  Physical Exam  General:  Affect appropriate Healthy:  appears stated age HEENT: normal Neck supple with no adenopathy JVP normal no bruits no thyromegaly Lungs clear with no wheezing and good diaphragmatic motion Heart:  S1/S2 no murmur,rub, gallop or click PMI normal Abdomen: benighn, BS positve, no tenderness, no AAA no bruit.  No HSM  or HJR Distal pulses intact with no bruits No edema Neuro non-focal Skin warm and dry Fistula left forearm   Impression & Recommendations:  Problem # 1:  CORONARY ARTERY BYPASS GRAFT, THREE VESSEL, HX OF (ICD-V45.81) Cath 5/11 with patent graft to left sided distal OM/PD, patent LIMA and known occluded diagonal graft.  Native vessels small TIMI 3 flow and not ideal for intervention.  Increase coreg to 25 two times a day, add Effient, and Ranexa when he returns from [facility].  Nitro pump spray sample also given.  Knows to go to hospital for any new symptoms or prolonged pain.    Problem # 2:  TACHYCARDIA (ICD-785.0) Labs during dialysis.  Increase coreg and re-evaluate  No signs of effusion or pericardial disease Orders: EKG w/ Interpretation (93000)  Problem # 3:  CONGESTIVE HEART FAILURE, HX OF (ICD-V12.50) Euvolemic woth no CHF symptoms or edema. Volume controlled at dialysis  Other Orders: EP Referral (Cardiology EP Ref )  Patient Instructions: 1)  Your physician recommends that you schedule a follow-up appointment in: 2-3 WEEKS WITH DR Eden Emms 2)  Your physician has recommended you make the following change in  your medication: INCREASE  CARVEDILOL TO 25 MG two times a day 3)  EFFIENT 30 MG  X 2 DOSES AND THEN 10 MG once daily 4)  RENEXA 500MG  two times a day 5)  You have been referred to 4-6 WEEKS DR Marcy Panning FOR ICD Prescriptions: RANEXA 500 MG XR12H-TAB (RANOLAZINE) 1 two times a day  #60 x 11   Entered by:   Scherrie Bateman, LPN   Authorized by:   Colon Branch, MD, [facility]   Signed by:   Scherrie Bateman, LPN on 16/09/9603   Method used:   Electronically to        [facility] [address] [phone]* (retail)       [address] [place], [place]  [address]       Ph: [phone]       Fax: [phone]   RxID:   [id number] EFFIENT 10 MG TABS (PRASUGREL HCL) 30 MG X 2 DOSES THEN 1 once daily  #30 x 11   Entered by:   Scherrie Bateman, LPN   Authorized by:   Colon Branch, MD, [facility]   Signed by:   Scherrie Bateman, LPN on 53/66/4403   Method used:   Electronically to        [facility] [address] [phone]* (retail)       [address] [place], [place]  [address]       Ph: [phone]       Fax: [phone]   RxID:   [id number] CARVEDILOL 25 MG TABS (CARVEDILOL) 1 two times a day  #60 x 11   Entered by:   Scherrie Bateman, LPN   Authorized by:   Colon Branch, MD, [facility]   Signed by:   Scherrie Bateman, LPN on 25/42/7062   Method used:   Electronically to        [facility] [address] [phone]* (retail)       [address] [place], [place]  [address]       Ph: [phone]       Fax: [phone]   RxID:   [id number]    EKG Report  Procedure date:  07/07/2010  Findings:      NSR  101 RBBB LVH with inferolateral ST/T wave changes  Since May LBBB no longer apparent

## 2011-01-26 NOTE — Assessment & Plan Note (Signed)
Summary: 3 MO ROV / SEEING VAL AT 1:30 /NWS #   Vital Signs:  Patient profile:   77 year old male Height:      73 inches (185.42 cm) Weight:      198.0 pounds (90.00 kg) O2 Sat:      99 % on Room air Temp:     98.0 degrees F (36.67 degrees C) oral Pulse rate:   111 / minute BP sitting:   112 / 62  (right arm) Cuff size:   regular  Vitals Entered By: Orlan Leavens (Apr 26, 2010 1:38 PM)  O2 Flow:  Room air CC: 3 mo rtn ov--Already seen Leschber./kb Is Patient Diabetic? Yes Pain Assessment Patient in pain? no        Referring Provider:  baxley Primary Provider:  Sharlet Salina, MD  CC:  3 mo rtn ov--Already seen Leschber./kb.  History of Present Illness: pt states he feels well in general.  he brings a record of his cbg's which i have reviewed today.  almost all are in am.  it is 180-220 in am.  he checks few at hs.  one was 85, 1 was 200.  he says he almost always eats bedtime snack.    Current Medications (verified): 1)  Lipitor 20 Mg  Tabs (Atorvastatin Calcium) .... Take 1 By Mouth Qd 2)  Celexa 20 Mg Tabs (Citalopram Hydrobromide) .Marland Kitchen.. 1 Tab By Mouth Once Daily 3)  Unifine Pentips 31g X 8 Mm  Misc (Insulin Pen Needle) .... Use As Directed 4)  Humulin N Pen 100 Unit/ml Susp (Insulin Isophane Human) .... 75 Units At Bedtime, 5)  Humalog Kwikpen 100 Unit/ml Soln (Insulin Lispro (Human)) .... Qac (Three Times A Day) 30-20-40 Units 6)  Nexium 40 Mg Cpdr (Esomeprazole Magnesium) .... Take 1 Tablet By Mouth Two Times A Day 7)  Bd U/f Short Pen Needle 31g X 8 Mm Misc (Insulin Pen Needle) .... Ue As Directed 8)  Adult Aspirin Low Strength 81 Mg Tbdp (Aspirin) .... Take 1 By Mouth Qd 9)  Ambien 5 Mg Tabs (Zolpidem Tartrate) .... As Needed 10)  Phenergan 12.5 .... As Needed 11)  Zofran 4 Mg Tabs (Ondansetron Hcl) .... As Needed 12)  Calcium Acetate 667 Mg Caps (Calcium Acetate (Phos Binder)) .... Take 1 Cap Before Meals 13)  Rena-Vite  Tabs (B Complex-C-Folic Acid) .... Take 1  Tablet By Mouth Once A Day 14)  Hydroxyzine Hcl 25 Mg Tabs (Hydroxyzine Hcl) .... As Needed For Itching 15)  Oxycodone-Acetaminophen 5-500 Mg Caps (Oxycodone-Acetaminophen) .... As Needed 16)  Imdur 60 Mg Xr24h-Tab (Isosorbide Mononitrate) .... Take 1 By Mouth Qd 17)  Nitroglycerin 0.4 Mg/hr Pt24 (Nitroglycerin) .... Use As Needed 18)  Carvedilol 12.5 Mg Tabs (Carvedilol) .... Take One Tablet By Mouth Twice A Day Except When He Has Dialysis in [place] Skip Morning Dose 19)  Dialysis .... On Mon, Wed, and Fri 20)  Nepro  Liqd (Nutritional Supplements) .... Daily 21)  Vitamin From Dialysis Center .... As Directed 22)  Sarna 0.5-0.5 % Lotn (Camphor-Menthol) .... As Directed 23)  Cpap .... For Obstructive Sleep Apnea 24)  Glucagon Emergency 1 Mg Kit (Glucagon (Rdna)) .... As Dir 25)  Stool Softener 100 Mg Caps (Docusate Sodium) .... As Needed 26)  Amiodarone Hcl 400 Mg Tabs (Amiodarone Hcl) .... Take One Tablet By Mouth Twice Daily  Allergies (verified): 1)  ! Procardia 2)  ! Adhesive Tape  Past History:  Past Medical History: Last updated: 01/18/2010 SLEEP  APNEA, OBSTRUCTIVE - CPAP qHS ESRD - HD MWF    a/w anemia of chronic dz HYPERTENSION  DYSLIPIDEMIA GERD DIABETES MELLITUS, insulin dep   a/w peripheral neuropathy and gastroparesis CORONARY ARTERY DISEASE s/p CABG 2003 - cath 12/31/09 - occluded sap dx graft, otherwise patent grafts chronic syst CHF - LVEF 35-40% by cath 12/31/09 depression Vit B12 deficency normal pressure hydrocephalus s/p VP shunt 08/2009  MD rooster -  endo - Shonique Pelphrey cards - [facility] GI - [facility] renal - patel    Review of Systems  The patient denies hypoglycemia.    Physical Exam  General:  elderly, frail, no distress  Neck:  Supple; no masses or thyromegaly. Additional Exam:   Hemoglobin A1C       [H]  7.0 %    Impression & Recommendations:  Problem # 1:  DIABETES MELLITUS, TYPE I, UNCONTROLLED (ICD-250.03) he is at risk for  hypoglycemia  Medications Added to Medication List This Visit: 1)  Humulin N Pen 100 Unit/ml Susp (Insulin isophane human) .... 70 units at bedtime 2)  Humalog Kwikpen 100 Unit/ml Soln (Insulin lispro (human)) .... 4 times a day: 30-20-40-15 units 3)  Humalog Kwikpen 100 Unit/ml Soln (Insulin lispro (human)) .... 4 times a day: 30-20-40-10 units  Other Orders: TLB-A1C / Hgb A1C (Glycohemoglobin) (83036-A1C) Est. Patient Level III (02725)  Patient Instructions: 1)  tests are being ordered for you today.  a few days after the test(s), please call [phone] to hear your test results. 2)  pending the test results, please increase novolog to (just before each meal) 30-20-40-15 units (the 15 units is if you eat a bedtime snack).  3)  take extra novolog 5 units as needed for any sugar over 300. 4)  Please schedule a follow-up appointment in 3 months. 5)  continue nph 75 units at night. 6)  check your blood sugar 4 times a day--before the 3 meals, and at bedtime.  also check if you have symptoms of your blood sugar being too high or too low.  please keep a record of the readings and bring it to your next appointment here.  please call us sooner if you are having low blood sugar episodes. 7)  (update: i left message on phone-tree:  reduce nph to 70 units at bedtime, and just take 10 units with hs-snack.  check cbg at various times of day, loooking for hypoglycemia).

## 2011-01-26 NOTE — Letter (Signed)
Summary: [facility]  [facility]   Imported By: Lanelle Bal 04/07/2010 09:33:56  _____________________________________________________________________  External Attachment:    Type:   Image     Comment:   External Document

## 2011-01-26 NOTE — Procedures (Signed)
Summary: pc2/jml   Visit Type:  Follow-up Referring Provider:  Fayrene Fearing Deterding,MD Primary Provider:  Newt Lukes MD  CC:  "Doing well"..  History of Present Illness: [name] returns today for followup.  He is a pleasant 77 yo man with a h/o intermittent CHB, CHF, LBBB, and recently underwent BiV ICD implant.  He returns today for followup.  He notes periods of weakness, fatigue and near syncope, particularly after dialysis.  He has not had frank syncope or and ICD discharge.  No peripheral edema.  Current Medications (verified): 1)  Lipitor 20 Mg  Tabs (Atorvastatin Calcium) .... Take 1 By Mouth Qd 2)  Celexa 20 Mg Tabs (Citalopram Hydrobromide) .Marland Kitchen.. 1 Tab By Mouth Once Daily 3)  Unifine Pentips 31g X 8 Mm  Misc (Insulin Pen Needle) .... Use As Directed 4)  Humulin N Pen 100 Unit/ml Susp (Insulin Isophane Human) .... 70 Units At Bedtime 5)  Humalog Kwikpen 100 Unit/ml Soln (Insulin Lispro (Human)) .... 4 Times A Day: 30-20-40-15 Units 6)  Nexium 40 Mg Cpdr (Esomeprazole Magnesium) .... Take 1 Tablet By Mouth Two Times A Day 7)  Bd U/f Short Pen Needle 31g X 8 Mm Misc (Insulin Pen Needle) .... Ue As Directed 8)  Adult Aspirin Low Strength 81 Mg Tbdp (Aspirin) .... Take 1 By Mouth Qd 9)  Ambien 5 Mg Tabs (Zolpidem Tartrate) .Marland Kitchen.. 1 By Mouth At Bedtime As Needed 10)  Phenergan 12.5 .... As Needed 11)  Zofran 4 Mg Tabs (Ondansetron Hcl) .... As Directed 12)  Calcium Acetate 667 Mg Caps (Calcium Acetate (Phos Binder)) .... Take 2 Cap Before Meals 13)  Rena-Vite  Tabs (B Complex-C-Folic Acid) .... Take 1 Tablet By Mouth Once A Day 14)  Hydroxyzine Hcl 25 Mg Tabs (Hydroxyzine Hcl) .... Morning of Dialysis 15)  Imdur 60 Mg Xr24h-Tab (Isosorbide Mononitrate) .... Take 1 By Mouth Qd 16)  Nitroglycerin 0.4 Mg/hr Pt24 (Nitroglycerin) .... Use As Needed 17)  Dialysis .... On Mon, Wed, and Fri 18)  Nepro  Liqd (Nutritional Supplements) .... Daily 19)  Vitamin From Dialysis Center .... As  Directed 20)  Sarna 0.5-0.5 % Lotn (Camphor-Menthol) .... As Directed 21)  Cpap .... For Obstructive Sleep Apnea 22)  Glucagon Emergency 1 Mg Kit (Glucagon (Rdna)) .... As Dir 23)  Stool Softener 100 Mg Caps (Docusate Sodium) .... As Needed 24)  Effient 10 Mg Tabs (Prasugrel Hcl) .... 30 Mg X 2 Doses Then 1 Once Daily 25)  Ranexa 500 Mg Xr12h-Tab (Ranolazine) .... Tale 1 By Mouth Once Daily 26)  Tylenol 325 Mg Tabs (Acetaminophen) .... As Needed 27)  Carvedilol 12.5 Mg Tabs (Carvedilol) .... Take 1 Two Times A Day  Allergies (verified): 1)  ! Procardia 2)  ! Adhesive Tape  Past History:  Past Medical History: Last updated: 11/01/2010 SLEEP APNEA, OBSTRUCTIVE - CPAP qHS ESRD - HD MWF    a/w anemia of chronic dz, hypotension HYPERTENSION hx DYSLIPIDEMIA GERD DIABETES MELLITUS, insulin dep   a/w peripheral neuropathy and gastroparesis CORONARY ARTERY DISEASE s/p CABG 2003 - cath 12/31/09 - occluded sap dx graft, otherwise patent grafts chronic syst CHF - LVEF 35-40% by cath 12/31/09; s/p BiV ICD 07/2010 depression Vit B12 deficency normal pressure hydrocephalus s/p VP shunt 08/2009  MD roster -   endo - ellison  cards - [facility] GI - [facility] renal - patel nsurg - cram    Past Surgical History: Last updated: 08/23/2010 CORONARY ARTERY BYPASS GRAFT, THREE VESSEL, HX OF (2003) CATARACT  EXTRACTIONS, BILATERAL, HX OF (ICD-V45.61) Permanent pacemaker  Risk Factors: Exercise: no (10/27/2008)  Risk Factors: Smoking Status: never (10/27/2008)  Review of Systems       The patient complains of dyspnea on exertion.  The patient denies chest pain, syncope, and peripheral edema.    Vital Signs:  Patient profile:   77 year old male Height:      73 inches Weight:      199.25 pounds BMI:     26.38 Pulse rate:   120 / minute BP sitting:   90 / 58  (left arm) Cuff size:   regular  Vitals Entered By: Bishop Dublin, CMA (November 10, 2010 11:45 AM)  Physical  Exam  General:  alert, well-developed, well-nourished, and cooperative to examination.   spouse at side Head:  Normocephalic and atraumatic. Eyes:  PERRLA, no icterus. Mouth:  No deformity or lesions, dentition normal. Neck:  Supple; no masses or thyromegaly. Chest Wall:  no hematoma or inflammation signs at site of new BiV ICD right chest wall - c/d/i w/o drainage Lungs:  normal respiratory effort, no intercostal retractions or use of accessory muscles; normal breath sounds bilaterally - no crackles and no wheezes.    Heart:  regular tachycardia, regular rhythm, no murmur, and no rub. BLE without edema.  Abdomen:  Soft, nontender and nondistended. No masses, hepatosplenomegaly or hernias noted. Normal bowel sounds.Bruising of the anterior abdominal wall area with some induration from his insulin shots. Msk:  No deformity or scoliosis noted of thoracic or lumbar spine.   Pulses:  dorsalis pedis intact bilat.  Extremities:  No clubbing, cyanosis, edema or deformities noted.trace pedal edema.   Neurologic:  Alert and  oriented x4;  grossly normal neurologically.    ICD Specifications Following MD:  Lewayne Bunting, MD     ICD Vendor:  [employer]     ICD Model Number:  [id number]     ICD Serial Number:  [id number] ICD DOI:  08/16/2010     ICD Implanting MD:  Lewayne Bunting, MD  Lead 1:    Location: RA     DOI: 08/16/2010     Model #: [id number]     Serial #: [id number]     Status: active Lead 2:    Location: RV     DOI: 08/16/2010     Model #: [id number]     Serial #: [id number]     Status: active Lead 3:    Location: LV     DOI: 08/16/2010     Model #: 2725     Serial #: [id number]     Status: active  Indications::  ICM   ICD Follow Up Remote Check?  No Battery Voltage:  3.20 V     Charge Time:  8.8 seconds     Underlying rhythm:  ST@122  ICD Dependent:  No       ICD Device Measurements Atrium:  Amplitude: 4.1 mV, Impedance: 513 ohms, Threshold: 1.125 V at 0.4 msec Right Ventricle:  Amplitude: 20 mV,  Impedance: 646 ohms, Threshold: 0.5 V at 0.4 msec Left Ventricle:  Impedance: 665 ohms, Threshold: 1.0 V at 0.4 msec Shock Impedance: 55/72 ohms   Episodes MS Episodes:  0     Percent Mode Switch:  0     Coumadin:  No Shock:  0     ATP:  0     Nonsustained:  1     Atrial Pacing:  0.2%     Ventricular Pacing:  99.8%  [name]  Parameters Mode DDD     Lower Rate Limit:  50     Upper Rate Limit 130 PAV 200     Sensed AV Delay:  160  Tachy Zones VF:  200     VT:  176     Next Remote Date:  02/09/2011     Next Cardiology Appt Due:  07/26/2011 Tech Comments:  Outputs reprogrammed for chronic thresholds.  Optivol and thoracic impedance normal. Resting heart rate 122bpm .  [facility] transmissions every 3 months.  ROV 8/12 with Dr. Ladona Ridgel in [place]. Altha Harm, LPN  November 10, 2010 11:48 AM  MD Comments:  Agree with above.  Impression & Recommendations:  Problem # 1:  CHRONIC SYSTOLIC HEART FAILURE (ICD-428.22) On carvedilol, he has remained hypotensive and tachycardic particularly after HD.  In light of his propensity to have high heart rates and low blood pressure, I have recommended he stop coreg and try toprol as its removal in the liver may result in more appropriate levels. My experience has been that Toprol lowers the blood pressure less and slows the heart rate more than coreg. The following medications were removed from the medication list:    Carvedilol 12.5 Mg Tabs (Carvedilol) .Marland Kitchen... Take 1 two times a day His updated medication list for this problem includes:    Adult Aspirin Low Strength 81 Mg Tbdp (Aspirin) .Marland Kitchen... Take 1 by mouth qd    Imdur 60 Mg Xr24h-tab (Isosorbide mononitrate) .Marland Kitchen... Take 1 by mouth qd    Nitroglycerin 0.4 Mg/hr Pt24 (Nitroglycerin) ..... Use as needed    Effient 10 Mg Tabs (Prasugrel hcl) .Marland KitchenMarland KitchenMarland KitchenMarland Kitchen 30 mg x 2 doses then 1 once daily    Ranexa 500 Mg Xr12h-tab (Ranolazine) .Marland Kitchen... Tale 1 by mouth once daily  Problem # 2:  AUTOMATIC IMPLANTABLE CARDIAC DEFIBRILLATOR SITU  (ICD-V45.02) HIs device is working normally with stable pacing.  His histograms demonstrate that his rates are elevated.  Problem # 3:  CORONARY ARTERY DISEASE (ICD-414.00) He currently denies anginal symptoms. Continue meds as noted below. The following medications were removed from the medication list:    Carvedilol 12.5 Mg Tabs (Carvedilol) .Marland Kitchen... Take 1 two times a day His updated medication list for this problem includes:    Adult Aspirin Low Strength 81 Mg Tbdp (Aspirin) .Marland Kitchen... Take 1 by mouth qd    Imdur 60 Mg Xr24h-tab (Isosorbide mononitrate) .Marland Kitchen... Take 1 by mouth qd    Nitroglycerin 0.4 Mg/hr Pt24 (Nitroglycerin) ..... Use as needed    Effient 10 Mg Tabs (Prasugrel hcl) .Marland KitchenMarland KitchenMarland KitchenMarland Kitchen 30 mg x 2 doses then 1 once daily    Ranexa 500 Mg Xr12h-tab (Ranolazine) .Marland Kitchen... Tale 1 by mouth once daily    Toprol Xl 50 Mg Xr24h-tab (Metoprolol succinate) .Marland Kitchen... Take 1/2 tablet daily x 2 weeks, then increase to 1 tablet daily  Patient Instructions: 1)  Your physician recommends that you schedule a follow-up appointment in: 3 months with Dr Ladona Ridgel 2)  Your physician has recommended you make the following change in your medication: Stop Carvedilol, start taking Toprol 50mg  1/2 tablet daily x 2 weeks, then increase to 1 tablet daily.   Prescriptions: TOPROL XL 50 MG XR24H-TAB (METOPROLOL SUCCINATE) Take 1/2 tablet daily x 2 weeks, then increase to 1 tablet daily  #30 x 6   Entered by:   Cloyde Reams RN   Authorized by:   Laren Boom, MD, [facility]   Signed by:   Cloyde Reams RN on 11/10/2010   Method used:  Electronically to        [facility] [phone]* (retail)       [address] [place], [place]  [address]       Ph: [phone]       Fax: [phone]   RxID:   [id number]

## 2011-01-26 NOTE — Medication Information (Signed)
Summary: [facility]  [facility]   Imported By: Sherian Rein 06/15/2010 08:41:27  _____________________________________________________________________  External Attachment:    Type:   Image     Comment:   External Document

## 2011-01-26 NOTE — Assessment & Plan Note (Signed)
Summary: 4wk f/u echo at 8:30   Referring Provider:  baxley Primary Provider:  Sharlet Salina, MD   History of Present Illness: [name] is seen today for F/U of CAD with distant CABG.  He was just cathed by Dr Juanda Chance and had an occluded SVG to diagonal with LIMA and all other grafts patient.  He continues to have SSCP.  He is taking a lot of nitro.  His filling pressures were high at cath but his is unable to tolerate a lower dry wight at dialysis due to hypotension.  He has been relatively tachycardic.  He is able to sleep with limited lower extremity edema.  I am not sure why he should be having SSCP since his SVG was totally occluded and the remainder of his anatomy was stable.  there's been no evidence of pericarditis renal or post MI>  He has been anemic but I dont have recent labs.  He is back on epogen per renal  doctors.  His fistula is only a month old and this may be contributing to symptoms via increased cardiac output.   I reviewed his echo from today and his EF was onlyh 25-30%.  ECG confirms what looks like flutter at a rate of 123.  I told him he had to take his coreg at 12.5 two times a day and we will start amiodarone at 400bid.  I would like him to see Dr Ladona Ridgel to consider flutter ablation and possible biV AICD.  His heart failure is difficult to Rx due to his hypotension at dialysis.  He only has a low left arm fistual and at least for now I think the risk of infection of a device is low.  Current Problems (verified): 1)  Atrial Flutter  (ICD-427.32) 2)  Tachycardia  (ICD-785.0) 3)  Malignant Neoplasm, Skin, Face  (ICD-173.3) 4)  Renal Dialysis Status  (ICD-V45.11) 5)  Renal Failure, End Stage  (ICD-585.6) 6)  Carotid Bruit, Right  (ICD-785.9) 7)  Coronary Artery Bypass Graft, Three Vessel, Hx of  (ICD-V45.81) 8)  Vitamin B12 Deficiency  (ICD-266.2) 9)  Peripheral Neuropathy  (ICD-356.9) 10)  Hx of Right Wrist Surgery.  () 11)  Cataract Extractions, Bilateral, Hx of   (ICD-V45.61) 12)  Sleep Apnea, Obstructive  (ICD-327.23) 13)  Congestive Heart Failure, Hx of  (ICD-V12.50) 14)  Gastroparesis  (ICD-536.3) 15)  Hx of Erosive Gastritis  (ICD-535.40) 16)  Diabetes Mellitus, Type I, Uncontrolled  (ICD-250.03) 17)  Anemia, Macrocytic  (ICD-281.9) 18)  Parkinson's Disease  (ICD-332.0) 19)  Hypercholesterolemia  (ICD-272.0) 20)  Hypertension  (ICD-401.9) 21)  Gerd  (ICD-530.81) 22)  Diabetes Mellitus, Type I  (ICD-250.01) 23)  Depression  (ICD-311) 24)  Coronary Artery Disease  (ICD-414.00) 25)  Colonic Polyps, Hx of  (ICD-V12.72)  Current Medications (verified): 1)  Lipitor 20 Mg  Tabs (Atorvastatin Calcium) .... Take 1 By Mouth Qd 2)  Celexa 20 Mg Tabs (Citalopram Hydrobromide) .Marland Kitchen.. 1 Tab By Mouth Once Daily 3)  Unifine Pentips 31g X 8 Mm  Misc (Insulin Pen Needle) .... Use As Directed 4)  Humulin N Pen 100 Unit/ml Susp (Insulin Isophane Human) .... 75 Units At Bedtime, 5)  Humalog Kwikpen 100 Unit/ml Soln (Insulin Lispro (Human)) .... Qac (Three Times A Day) 30-20-40 Units 6)  Nexium 40 Mg Cpdr (Esomeprazole Magnesium) .... Take 1 Tablet By Mouth Two Times A Day 7)  Bd U/f Short Pen Needle 31g X 8 Mm Misc (Insulin Pen Needle) .... Ue As Directed 8)  Adult  Aspirin Low Strength 81 Mg Tbdp (Aspirin) .... Take 1 By Mouth Qd 9)  Ambien 5 Mg Tabs (Zolpidem Tartrate) .... As Needed 10)  Phenergan 12.5 .... As Needed 11)  Zofran 4 Mg Tabs (Ondansetron Hcl) .... As Needed 12)  Calcium Acetate 667 Mg Caps (Calcium Acetate (Phos Binder)) .... Take 1 Cap Before Meals 13)  Rena-Vite  Tabs (B Complex-C-Folic Acid) .... Take 1 Tablet By Mouth Once A Day 14)  Hydroxyzine Hcl 25 Mg Tabs (Hydroxyzine Hcl) .... As Needed For Itching 15)  Oxycodone-Acetaminophen 5-500 Mg Caps (Oxycodone-Acetaminophen) .... As Needed 16)  Imdur 60 Mg Xr24h-Tab (Isosorbide Mononitrate) .... Take 1 By Mouth Qd 17)  Nitroglycerin 0.4 Mg/hr Pt24 (Nitroglycerin) .... Use As Needed 18)   Carvedilol 12.5 Mg Tabs (Carvedilol) .... Take One Tablet By Mouth Twice A Day Except When He Has Dialysis in The Mornings Skip Morning Dose 19)  Dialysis .... On Mon, Wed, and Fri 20)  Nepro  Liqd (Nutritional Supplements) .... Daily 21)  Vitamin From Dialysis Center .... As Directed 22)  Sarna 0.5-0.5 % Lotn (Camphor-Menthol) .... As Directed 23)  Cpap .... For Obstructive Sleep Apnea 24)  Glucagon Emergency 1 Mg Kit (Glucagon (Rdna)) .... As Dir 25)  Stool Softener 100 Mg Caps (Docusate Sodium) .... As Needed 26)  Amiodarone Hcl 400 Mg Tabs (Amiodarone Hcl) .... Take One Tablet By Mouth Twice  Allergies (verified): 1)  ! Procardia 2)  ! Adhesive Tape  Past History:  Past Medical History: Last updated: 01/18/2010 SLEEP APNEA, OBSTRUCTIVE - CPAP qHS ESRD - HD MWF    a/w anemia of chronic dz HYPERTENSION  DYSLIPIDEMIA GERD DIABETES MELLITUS, insulin dep   a/w peripheral neuropathy and gastroparesis CORONARY ARTERY DISEASE s/p CABG 2003 - cath 12/31/09 - occluded sap dx graft, otherwise patent grafts chronic syst CHF - LVEF 35-40% by cath 12/31/09 depression Vit B12 deficency normal pressure hydrocephalus s/p VP shunt 08/2009  MD rooster -  endo - ellison cards - [facility] - [facility] - [facility]    Past Surgical History: Last updated: 01/18/2010 CORONARY ARTERY BYPASS GRAFT, THREE VESSEL, HX OF (2003) CATARACT EXTRACTIONS, BILATERAL, HX OF (ICD-V45.61)  Family History: Last updated: 03/27/2009 No FH of Colon Cancer: Stomach cancer: Paternal Aunt Family History of Diabetes: Brother, Father Father:Died at age 55 Lung Cancer Mother:Died at age 9 of a stroke  Social History: Last updated: 01/18/2010 Patient has never smoked. Alcohol Use - no Illicit Drug Use - no Patient does not get regular exercise.  Pt is on dialysis - MWF - since 09/2009 married, lives with spouse, supportive dtr and son nearby  Review of Systems       Denies fever, malais, weight  loss, blurry vision, decreased visual acuity, cough, sputum, SOB, hemoptysis, pleuritic pain, palpitaitons, heartburn, abdominal pain, melena, lower extremity edema, claudication, or rash.'   Vital Signs:  Patient profile:   77 year old male Height:      73 inches Weight:      192 pounds BMI:     25.42 Pulse rate:   123 / minute Pulse rhythm:   flutter Resp:     12 per minute BP sitting:   116 / 61  (left arm)  Vitals Entered By: Kem Parkinson (March 03, 2010 9:18 AM)  Physical Exam  General:  Affect appropriate Healthy:  appears stated age HEENT: normal Neck supple with no adenopathy JVP normal no bruits no thyromegaly Lungs clear with no wheezing and good diaphragmatic motion  Heart:  S1/S2 MR  murmur, no rub, gallop or click PMI enlarged Abdomen: benighn, BS positve, no tenderness, no AAA no bruit.  No HSM or HJR Distal pulses intact with no bruits No edema Neuro non-focal Skin warm and dry Fistula in left arm with good thrill   Impression & Recommendations:  Problem # 1:  ATRIAL FLUTTER (ICD-427.32) Take proper dose of coreg, start Amiodarone F/U GT to consider ablation His updated medication list for this problem includes:    Adult Aspirin Low Strength 81 Mg Tbdp (Aspirin) .Marland Kitchen... Take 1 by mouth qd    Carvedilol 12.5 Mg Tabs (Carvedilol) .Marland Kitchen... Take one tablet by mouth twice a day except when he has dialysis in the mornings skip morning dose    Amiodarone Hcl 400 Mg Tabs (Amiodarone hcl) .Marland Kitchen... Take one tablet by mouth twice  Orders: EKG w/ Interpretation (93000) EP Referral (Cardiology EP Ref )  Problem # 2:  CORONARY ARTERY BYPASS GRAFT, THREE VESSEL, HX OF (ICD-V45.81) Ischemic DCM with recent occlusion of SVG to Diagonal.  Continue medical Rx.  Consider BiV AICD Orders: EKG w/ Interpretation (93000) EP Referral (Cardiology EP Ref )  Problem # 3:  CONGESTIVE HEART FAILURE, HX OF (ICD-V12.50) Currently clinically euvolemic but increased risk of recurrant  CHF due to tachycardia.  Hopefully amiodarone and coreg will help and more long term coumadin will not be needed Orders: EKG w/ Interpretation (93000) EP Referral (Cardiology EP Ref )  Problem # 4:  HYPERCHOLESTEROLEMIA (ICD-272.0) Continue statin.  LDL has been less than 100 His updated medication list for this problem includes:    Lipitor 20 Mg Tabs (Atorvastatin calcium) .Marland Kitchen... Take 1 by mouth qd  Patient Instructions: 1)  Your physician recommends that you schedule a follow-up appointment in: 3 months 2)  We will set you up to see Dr. Ladona Ridgel ASAP for evaluation of a Bi-V ICD and possible atrial flutter ablation.  3)  Your physician has recommended you make the following change in your medication: 1) Start amiodarone 400mg  two times a day. Prescriptions: AMIODARONE HCL 400 MG TABS (AMIODARONE HCL) Take one tablet by mouth twice  #60 x 6   Entered by:   Sherri Rad, RN, BSN   Authorized by:   Colon Branch, MD, [facility]   Signed by:   Sherri Rad, RN, BSN on 03/03/2010   Method used:   Electronically to        [facility] [phone]* (retail)       [address] [place], [place]  [address]       Ph: [phone]       Fax: [phone]   RxID:   [id number]    Echocardiogram Report  Procedure date:  03/03/2010  Findings:      atrial flutter at rate of 123

## 2011-01-26 NOTE — Progress Notes (Signed)
Summary: [facility]  Phone Note Other Incoming   Summary of Call: Faxed completed paperwork to [facility] and sent a copy to be scanned. Initial call taken by: Josph Macho CMA,  January 19, 2010 3:30 PM

## 2011-01-26 NOTE — Assessment & Plan Note (Signed)
Summary: 23 MO ROV /NWS  #   Vital Signs:  Patient profile:   77 year old male Height:      73 inches (185.42 cm) Weight:      201 pounds (91.36 kg) BMI:     26.61 O2 Sat:      97 % on Room air Temp:     97.7 degrees F (36.50 degrees C) oral Pulse rate:   118 / minute BP sitting:   104 / 60  (left arm) Cuff size:   regular  Vitals Entered By: Brenton Grills CMA Duncan Dull) (November 01, 2010 10:07 AM)  O2 Flow:  Room air CC: Follow-up visit/aj Is Patient Diabetic? Yes   Referring Provider:  Fayrene Fearing Deterding,MD Primary Provider:  Newt Lukes MD  CC:  Follow-up visit/aj.  History of Present Illness: he brings a record of his cbg's which i have reviewed today.  it varies from 100-200, with no trend thoughout the day.  pt states he feels well in general.  Current Medications (verified): 1)  Lipitor 20 Mg  Tabs (Atorvastatin Calcium) .... Take 1 By Mouth Qd 2)  Celexa 20 Mg Tabs (Citalopram Hydrobromide) .Marland Kitchen.. 1 Tab By Mouth Once Daily 3)  Unifine Pentips 31g X 8 Mm  Misc (Insulin Pen Needle) .... Use As Directed 4)  Humulin N Pen 100 Unit/ml Susp (Insulin Isophane Human) .... 70 Units At Bedtime 5)  Humalog Kwikpen 100 Unit/ml Soln (Insulin Lispro (Human)) .... 4 Times A Day: 30-20-40-15 Units 6)  Nexium 40 Mg Cpdr (Esomeprazole Magnesium) .... Take 1 Tablet By Mouth Two Times A Day 7)  Bd U/f Short Pen Needle 31g X 8 Mm Misc (Insulin Pen Needle) .... Ue As Directed 8)  Adult Aspirin Low Strength 81 Mg Tbdp (Aspirin) .... Take 1 By Mouth Qd 9)  Ambien 5 Mg Tabs (Zolpidem Tartrate) .Marland Kitchen.. 1 By Mouth At Bedtime As Needed 10)  Phenergan 12.5 .... As Needed 11)  Zofran 4 Mg Tabs (Ondansetron Hcl) .... As Directed 12)  Calcium Acetate 667 Mg Caps (Calcium Acetate (Phos Binder)) .... Take 2 Cap Before Meals 13)  Rena-Vite  Tabs (B Complex-C-Folic Acid) .... Take 1 Tablet By Mouth Once A Day 14)  Hydroxyzine Hcl 25 Mg Tabs (Hydroxyzine Hcl) .... Morning of Dialysis 15)  Imdur 60 Mg  Xr24h-Tab (Isosorbide Mononitrate) .... Take 1 By Mouth Qd 16)  Nitroglycerin 0.4 Mg/hr Pt24 (Nitroglycerin) .... Use As Needed 17)  Dialysis .... On Mon, Wed, and Fri 18)  Nepro  Liqd (Nutritional Supplements) .... Daily 19)  Vitamin From Dialysis Center .... As Directed 20)  Sarna 0.5-0.5 % Lotn (Camphor-Menthol) .... As Directed 21)  Cpap .... For Obstructive Sleep Apnea 22)  Glucagon Emergency 1 Mg Kit (Glucagon (Rdna)) .... As Dir 23)  Stool Softener 100 Mg Caps (Docusate Sodium) .... As Needed 24)  Effient 10 Mg Tabs (Prasugrel Hcl) .... 30 Mg X 2 Doses Then 1 Once Daily 25)  Ranexa 500 Mg Xr12h-Tab (Ranolazine) .... Tale 1 By Mouth Once Daily 26)  Tylenol 325 Mg Tabs (Acetaminophen) .... As Needed 27)  Carvedilol 12.5 Mg Tabs (Carvedilol) .... Take 1 Two Times A Day  Allergies (verified): 1)  ! Procardia 2)  ! Adhesive Tape  Past History:  Past Medical History: Last updated: 08/23/2010 SLEEP APNEA, OBSTRUCTIVE - CPAP qHS ESRD - HD MWF    a/w anemia of chronic dz, hypotension HYPERTENSION hx DYSLIPIDEMIA GERD DIABETES MELLITUS, insulin dep   a/w peripheral neuropathy and gastroparesis CORONARY  ARTERY DISEASE s/p CABG 2003 - cath 12/31/09 - occluded sap dx graft, otherwise patent grafts chronic syst CHF - LVEF 35-40% by cath 12/31/09; s/p BiV ICD 07/2010 depression Vit B12 deficency normal pressure hydrocephalus s/p VP shunt 08/2009  MD roster -  endo - Cyra Spader  cards - [facility] GI - [facility] renal - patel nsurg - cram    Review of Systems  The patient denies hypoglycemia.    Physical Exam  General:  normal appearance.   Skin:  injection sites without lesions.   Additional Exam:  a1c at dialysis was 5.3 (sept, 2011)  Fructosamine         [H]  340 umol/L (converts to a1c of 7.4)   Impression & Recommendations:  Problem # 1:  DIABETES MELLITUS, TYPE I, UNCONTROLLED (ICD-250.03) a1c is prob falsely low due to dialysis  Other  Orders: T-Fructosamine ([address]-60454) Est. Patient Level III (09811)  Patient Instructions: 1)  tests are being ordered for you today.  a few days after the test(s), please call [phone] to hear your test results. 2)  pending the test results, please continue novolog (just before each meal) 30-20-40-15 units (the 15 units is if you eat a bedtime snack).  3)  take extra novolog 5 units as needed for any sugar over 300. 4)  Please schedule a follow-up appointment in 3 months. 5)  continue nph 70 units at night. 6)  check your blood sugar 4 times a day--before the 3 meals, and at bedtime.  also check if you have symptoms of your blood sugar being too high or too low.  please keep a record of the readings and bring it to your next appointment here.  please call us sooner if you are having low blood sugar episodes.   7)  (update: i left message on phone-tree:  rx as we discussed)   Orders Added: 1)  T-Fructosamine [[id number]] 2)  Est. Patient Level III [56213]   Immunization History:  Influenza Immunization History:    Influenza:  historical (08/25/2010)   Immunization History:  Influenza Immunization History:    Influenza:  Historical (08/25/2010)

## 2011-01-26 NOTE — Cardiovascular Report (Signed)
Summary: Office Visit   Office Visit   Imported By: Roderic Ovens 09/12/2010 15:52:50  _____________________________________________________________________  External Attachment:    Type:   Image     Comment:   External Document

## 2011-01-26 NOTE — Progress Notes (Signed)
Summary: Rx refill req  Phone Note Refill Request Message from:  Patient on August 30, 2010 1:08 PM  Refills Requested: Medication #1:  HYDROXYZINE HCL 25 MG TABS morning of dialysis   Dosage confirmed as above?Dosage Confirmed   Supply Requested: 3 months  Method Requested: Electronic Initial call taken by: Margaret Pyle, CMA,  August 30, 2010 1:08 PM    Prescriptions: HYDROXYZINE HCL 25 MG TABS (HYDROXYZINE HCL) morning of dialysis  #30 x 1   Entered by:   Margaret Pyle, CMA   Authorized by:   Newt Lukes MD   Signed by:   Margaret Pyle, CMA on 08/30/2010   Method used:   Electronically to        [facility] [address] [phone]* (retail)       [address] [place], [place]  [address]       Ph: [phone]       Fax: [phone]   RxID:   [id number]

## 2011-01-26 NOTE — Medication Information (Signed)
Summary: Updated Diabetic Supplies / [facility]   Updated Diabetic Supplies / [facility]   Imported By: Lennie Odor 06/23/2010 12:14:12  _____________________________________________________________________  External Attachment:    Type:   Image     Comment:   External Document

## 2011-01-26 NOTE — Letter (Signed)
Summary: [facility]  [facility]   Imported By: Marylou Mccoy 08/18/2010 14:02:55  _____________________________________________________________________  External Attachment:    Type:   Image     Comment:   External Document

## 2011-01-26 NOTE — Progress Notes (Signed)
Summary: chest pains  Phone Note Call from Patient Call back at Home Phone [phone]   Caller: Patient Summary of Call: chest pains this morning Initial call taken by: Judie Grieve,  January 10, 2010 2:43 PM  Follow-up for Phone Call        S/W daughter and  [name] had a cath done by Dr. Juanda Chance recently, at which time he added Lisinopril to his medications. His BP dropped last week while at dialysis to 56/35 at which time the dialysis center stopped  his Lisinopril and Toprol, leaving him on Imdur only. Since that time his BP has been  running approx. 102/60, highest reading being 172/98 without these meds. The pt has had CP since these were stopped, which has been relieved with nitro. Today Dr. Allena Katz at [facility] gave him a RX for Carvedilol 3.125mg . Pt's daughter wants to know if we think he needs to be seen sooner since we were not the one's to change his medications. She is also concerned about the CP since his meds were stopped. I s/w [name], RN and she plans to s/w Dr. Eden Emms tomorrow and see if he wants to make any changes in the meds or wants to see him before his scheduled appt. Feb 8th. Daughter [name] is aware of plan. Follow-up by: Duncan Dull, RN, BSN,  January 10, 2010 3:42 PM  Additional Follow-up for Phone Call Additional follow up Details #1::        He was just cathed and medical Rx.  F/U with me in 3-4 weeks Additional Follow-up by: Colon Branch, MD, [facility],  January 10, 2010 7:03 PM     Appended Document: chest pains spoke with dtr, no changes at present. pt has started the carvedalol and is taking the imdur. he has had no chest pain today. f/u 02-01-10, they will call with further concerns

## 2011-01-26 NOTE — Letter (Signed)
Summary: Glucose Testing Supplies/[facility]  Glucose Testing Supp[facility]   Imported By: Sherian Rein 12/07/2010 14:45:55  _____________________________________________________________________  External Attachment:    Type:   Image     Comment:   External Document

## 2011-01-26 NOTE — Medication Information (Signed)
Summary: Orders / [facility]  Orders / [facility]   Imported By: Lennie Odor 11/02/2010 10:34:46  _____________________________________________________________________  External Attachment:    Type:   Image     Comment:   External Document

## 2011-01-26 NOTE — Assessment & Plan Note (Signed)
Summary: 2 WK F/U #/CD   Vital Signs:  Patient profile:   77 year old male Height:      73 inches (185.42 cm) Weight:      189.25 pounds (86.02 kg) O2 Sat:      97 % on Room air Temp:     98.1 degrees F (36.72 degrees C) oral Pulse rate:   124 / minute BP sitting:   120 / 72  (left arm) Cuff size:   regular  Vitals Entered By: Josph Macho CMA (January 18, 2010 11:02 AM)  O2 Flow:  Room air CC: 2 week follow up/ has paperwork from [facility] that he would like filled out/ CF Is Patient Diabetic? Yes   Referring Provider:  baxley Primary Provider:  Sharlet Salina, MD  CC:  2 week follow up/ has paperwork from [facility] that he would like filled out/ CF.  History of Present Illness: he brings a record of his cbg's which i have reviewed today.  he had 1 episode of mild hypoglycemia (66 in the afternoon, on a dialysis day).  i see no pattern to the cbg's according to his dialysis days.    Current Medications (verified): 1)  Flomax 0.4 Mg  Cp24 (Tamsulosin Hcl) .... Take 1 On Non Dialysis Days 2)  Lipitor 20 Mg  Tabs (Atorvastatin Calcium) .... Take 1 By Mouth Qd 3)  Celexa 20 Mg Tabs (Citalopram Hydrobromide) .Marland Kitchen.. 1 Tab By Mouth Once Daily 4)  Unifine Pentips 31g X 8 Mm  Misc (Insulin Pen Needle) .... Use As Directed 5)  Humulin N Pen 100 Unit/ml Susp (Insulin Isophane Human) .... 75 Units At Bedtime, 6)  Humalog Kwikpen 100 Unit/ml Soln (Insulin Lispro (Human)) .... Qac (Three Times A Day) 25-20-40 Units 7)  Nexium 40 Mg Cpdr (Esomeprazole Magnesium) .... Take 1 Tablet By Mouth Two Times A Day 8)  Bd U/f Short Pen Needle 31g X 8 Mm Misc (Insulin Pen Needle) .... Ue As Directed 9)  Adult Aspirin Low Strength 81 Mg Tbdp (Aspirin) .... Take 1 By Mouth Qd 10)  Ambien .... As Needed 11)  Phenergan 12.5 .... As Needed 12)  Zofran 4 Mg Tabs (Ondansetron Hcl) .... As Needed 13)  Calcium Acetate 667 Mg Caps (Calcium Acetate (Phos Binder)) .... Take 1 Cap Before  Meals 14)  Rena-Vite  Tabs (B Complex-C-Folic Acid) .... Take 1 Tablet By Mouth Once A Day 15)  Hydroxyzine Hcl 25 Mg Tabs (Hydroxyzine Hcl) .... As Needed For Itching 16)  Oxycodone-Acetaminophen 5-500 Mg Caps (Oxycodone-Acetaminophen) .... As Needed 17)  Imdur 60 Mg Xr24h-Tab (Isosorbide Mononitrate) .... Take 1 By Mouth Qd 18)  Nitroglycerin 0.4 Mg/hr Pt24 (Nitroglycerin) .... Use As Needed 19)  Coreg 3.125 Mg Tabs (Carvedilol) .... Take 1 Two Times A Day 20)  Dialysis .... On Mon, Wed, and Fri  Allergies (verified): 1)  ! Procardia 2)  ! Adhesive Tape  Past History:  Past Medical History: Last updated: 03/27/2009 VITAMIN B12 DEFICIENCY (ICD-266.2) PERIPHERAL NEUROPATHY (ICD-356.9) SLEEP APNEA, OBSTRUCTIVE (ICD-327.23) CONGESTIVE HEART FAILURE, HX OF (ICD-V12.50) GASTROPARESIS (ICD-536.3) FEVER UNSPECIFIED (ICD-780.60) NAUSEA AND VOMITING (ICD-787.01) Hx of EROSIVE GASTRITIS (ICD-535.40) DIABETES MELLITUS, TYPE I, UNCONTROLLED (ICD-250.03) ANEMIA, MACROCYTIC (ICD-281.9) PARKINSON'S DISEASE (ICD-332.0) HYPERCHOLESTEROLEMIA (ICD-272.0) RENAL INSUFFICIENCY (ICD-588.9) HYPERTENSION (ICD-401.9) GERD (ICD-530.81) DIABETES MELLITUS, TYPE I (ICD-250.01) DEPRESSION (ICD-311) CORONARY ARTERY DISEASE (ICD-414.00) COLONIC POLYPS, HX OF (ICD-V12.72)    Review of Systems  The patient denies syncope.    Physical Exam  General:  normal  appearance.   Pulses:  dorsalis pedis intact bilat.  Extremities:  no deformity.  no ulcer on the feet.  feet are of normal color and temp.  no edema the dorsal aspect of the right foot has a bony prominence. Neurologic:  sensation is intact to touch on the feet, but decreased from normal.  Additional Exam:  (a1c was 7.1 at dialysis last month)   Impression & Recommendations:  Problem # 1:  DIABETES MELLITUS, TYPE I, UNCONTROLLED (ICD-250.03) needs increased rx  Medications Added to Medication List This Visit: 1)  Humalog Kwikpen 100  Unit/ml Soln (Insulin lispro (human)) .... Qac (three times a day) 30-20-40 units  Other Orders: Est. Patient Level III (16109)  Patient Instructions: 1)  increase novolog to (just before each meal) 30-20-40-5 units (the 5 units is if you eat a bedtime snack).  2)  take extra novolog 5 units as needed for any sugar over 300. 3)  Please schedule a follow-up appointment in 3 months. 4)  continue nph 75 units at night. 5)  check your blood sugar 4 times a day--before the 3 meals, and at bedtime.  also check if you have symptoms of your blood sugar being too high or too low.  please keep a record of the readings and bring it to your next appointment here.  please call us sooner if you are having low blood sugar episodes.

## 2011-01-26 NOTE — Assessment & Plan Note (Signed)
Summary: 3 MO ROV / SEEING SAE AT 10/NWS  #   Vital Signs:  Patient profile:   77 year old male Height:      73 inches (185.42 cm) Weight:      201.0 pounds (91.36 kg) O2 Sat:      97 % on Room air Temp:     97.7 degrees F (36.50 degrees C) oral Pulse rate:   118 / minute BP sitting:   104 / 60  (left arm) Cuff size:   regular  Vitals Entered By: Orlan Leavens RMA (November 01, 2010 11:18 AM)  O2 Flow:  Room air CC: 3 month follow-up Is Patient Diabetic? Yes Did you bring your meter with you today? Yes Pain Assessment Patient in pain? no        Primary Care Provider:  Newt Lukes MD  CC:  3 month follow-up.  History of Present Illness: bradycardia event 07/2010 (intermitt CHB) s/p BiVICD for same -  stable with no cardiac issues since that time  ESRD - HD MWF - begun 09/2009 - no UOP continued hypotension since dc from hosp 01/02/10 a/w HD events s/p med changes for BP/card meds but not sig improved - no syncope or dizzy symptoms   DM2 - follows with endo for same - reports cbgs range variable, checks sugars 2-3 x/day- compliant with insulin as rx'd no signs or symptoms hypoglycemia  depression hx -  well controlled reports compliance with ongoing medical treatment - celexa denies adverse side effects related to current therapy.  no active depression signs or symptoms per pt and wife  CAD - cath 12/31/09 without sig occlusion of prior CABG grafts + near daily (5/7 d/wk) recurrent CP - has seen cards for same- relieved with NTG SL use no syncope but episodic dizziness (esp s/p HD) feels better since stopping amio and getting BiV ICD    Clinical Review Panels:  Immunizations   Last Tetanus Booster:  Historical (12/25/2008)   Last Flu Vaccine:  Historical given @ Dialysis (09/24/2010)   Last Pneumovax:  Historical (12/25/2008)  Lipid Management   Cholesterol:  186 (07/26/2010)   HDL (good cholesterol):  31.70 (07/26/2010)  CBC   WBC:  4.4 (08/17/2010)   RBC:  2.19 (08/17/2010)   Hgb:  8.9 (08/17/2010)   Hct:  26.6 (08/17/2010)   Platelets:  123 (08/17/2010)   MCV  100.5 (08/17/2010)   MCHC  35.1 (10/27/2008)   RDW  16.2 (08/17/2010)   PMN:  75 (08/14/2010)   Lymphs:  12.7 (10/27/2008)   Monos:  8 (08/14/2010)   Eosinophils:  0 (08/14/2010)   Basophil:  0 (08/14/2010)  Complete Metabolic Panel   Glucose:  176 (08/17/2010)   Sodium:  135 (08/17/2010)   Potassium:  4.0 (08/17/2010)   Chloride:  98 (08/17/2010)   CO2:  28 (08/17/2010)   BUN:  37 (08/17/2010)   Creatinine:  7.05 (08/17/2010)   Albumin:  2.9 (08/17/2010)   Total Protein:  7.4 (07/26/2010)   Calcium:  8.3 (08/17/2010)   Total Bili:  1.0 (07/26/2010)   Alk Phos:  20 (07/26/2010)   SGPT (ALT):  21 (07/26/2010)   SGOT (AST):  23 (07/26/2010)   Current Medications (verified): 1)  Lipitor 20 Mg  Tabs (Atorvastatin Calcium) .... Take 1 By Mouth Qd 2)  Celexa 20 Mg Tabs (Citalopram Hydrobromide) .Marland Kitchen.. 1 Tab By Mouth Once Daily 3)  Unifine Pentips 31g X 8 Mm  Misc (Insulin Pen Needle) .... Use As Directed 4)  Humulin N Pen 100 Unit/ml Susp (Insulin Isophane Human) .... 70 Units At Bedtime 5)  Humalog Kwikpen 100 Unit/ml Soln (Insulin Lispro (Human)) .... 4 Times A Day: 30-20-40-15 Units 6)  Nexium 40 Mg Cpdr (Esomeprazole Magnesium) .... Take 1 Tablet By Mouth Two Times A Day 7)  Bd U/f Short Pen Needle 31g X 8 Mm Misc (Insulin Pen Needle) .... Ue As Directed 8)  Adult Aspirin Low Strength 81 Mg Tbdp (Aspirin) .... Take 1 By Mouth Qd 9)  Ambien 5 Mg Tabs (Zolpidem Tartrate) .Marland Kitchen.. 1 By Mouth At Bedtime As Needed 10)  Phenergan 12.5 .... As Needed 11)  Zofran 4 Mg Tabs (Ondansetron Hcl) .... As Directed 12)  Calcium Acetate 667 Mg Caps (Calcium Acetate (Phos Binder)) .... Take 2 Cap Before Meals 13)  Rena-Vite  Tabs (B Complex-C-Folic Acid) .... Take 1 Tablet By Mouth Once A Day 14)  Hydroxyzine Hcl 25 Mg Tabs (Hydroxyzine Hcl) .... Morning of Dialysis 15)  Imdur 60 Mg  Xr24h-Tab (Isosorbide Mononitrate) .... Take 1 By Mouth Qd 16)  Nitroglycerin 0.4 Mg/hr Pt24 (Nitroglycerin) .... Use As Needed 17)  Dialysis .... On Mon, Wed, and Fri 18)  Nepro  Liqd (Nutritional Supplements) .... Daily 19)  Vitamin From Dialysis Center .... As Directed 20)  Sarna 0.5-0.5 % Lotn (Camphor-Menthol) .... As Directed 21)  Cpap .... For Obstructive Sleep Apnea 22)  Glucagon Emergency 1 Mg Kit (Glucagon (Rdna)) .... As Dir 23)  Stool Softener 100 Mg Caps (Docusate Sodium) .... As Needed 24)  Effient 10 Mg Tabs (Prasugrel Hcl) .... 30 Mg X 2 Doses Then 1 Once Daily 25)  Ranexa 500 Mg Xr12h-Tab (Ranolazine) .... Tale 1 By Mouth Once Daily 26)  Tylenol 325 Mg Tabs (Acetaminophen) .... As Needed 27)  Carvedilol 12.5 Mg Tabs (Carvedilol) .... Take 1 Two Times A Day  Allergies (verified): 1)  ! Procardia 2)  ! Adhesive Tape  Past History:  Past Medical History: SLEEP APNEA, OBSTRUCTIVE - CPAP qHS ESRD - HD MWF    a/w anemia of chronic dz, hypotension HYPERTENSION hx DYSLIPIDEMIA GERD DIABETES MELLITUS, insulin dep   a/w peripheral neuropathy and gastroparesis CORONARY ARTERY DISEASE s/p CABG 2003 - cath 12/31/09 - occluded sap dx graft, otherwise patent grafts chronic syst CHF - LVEF 35-40% by cath 12/31/09; s/p BiV ICD 07/2010 depression Vit B12 deficency normal pressure hydrocephalus s/p VP shunt 08/2009  MD roster -   endo - ellison  cards - [facility] nishan/[facility] GI - [facility] renal - patel nsurg - cram    Review of Systems  The patient denies anorexia, fever, peripheral edema, and abdominal pain.    Physical Exam  General:  alert, well-developed, well-nourished, and cooperative to examination.   spouse at side Lungs:  normal respiratory effort, no intercostal retractions or use of accessory muscles; normal breath sounds bilaterally - no crackles and no wheezes.    Heart:  normal rate (high end of normal), regular rhythm, no murmur, and no rub. BLE without  edema.  Psych:  Oriented X3, memory intact for recent and remote, normally interactive, good eye contact, not anxious appearing, not depressed appearing, and not agitated.      Impression & Recommendations:  Problem # 1:  RENAL FAILURE, END STAGE (ICD-585.6)  HD MWF with cont hypotension s/p HD - per renal and cards  Labs Reviewed: BUN: 37 (08/17/2010)   Cr: 7.05 (08/17/2010)    Hgb: 8.9 (08/17/2010)   Hct: 26.6 (08/17/2010)   Ca++: 8.3 (08/17/2010)  TP: 7.4 (07/26/2010)   Alb: 2.9 (08/17/2010)  Problem # 2:  CHRONIC SYSTOLIC HEART FAILURE (ICD-428.22)  His updated medication list for this problem includes:    Adult Aspirin Low Strength 81 Mg Tbdp (Aspirin) .Marland Kitchen... Take 1 by mouth qd    Effient 10 Mg Tabs (Prasugrel hcl) .Marland KitchenMarland KitchenMarland KitchenMarland Kitchen 30 mg x 2 doses then 1 once daily    Carvedilol 12.5 Mg Tabs (Carvedilol) .Marland Kitchen... Take 1 two times a day  s/p BiVICD 08/16/10 for symptomatic bradycardia/intermit CHB/LBBB - symptoms improved - f/u EP + cards as ongoing  Echocardiogram: SUMMARY   -  Overall left ventricular systolic function was normal. Left         ventricular ejection fraction was estimated , range being 55         % to 65 %. There were no left ventricular regional wall         motion abnormalities. Left ventricular wall thickness was         mildly increased. Doppler parameters were consistent with         high left ventricular filling pressure.   -  There was mild mitral annular calcification. There was mild         mitral valvular regurgitation.   -  The left atrium was mildly dilated.   -  The estimated peak pulmonary artery systolic pressure was mild to         moderately increased.   -  The right atrium was mildly dilated.     ---------------------------------------------------------------    Prepared and Electronically Authenticated by    Olga Millers M.D. Ejection Fraction:  >=50%.   (11/05/2007)  Problem # 3:  DIABETES MELLITUS, TYPE I, UNCONTROLLED (ICD-250.03)  His  updated medication list for this problem includes:    Humulin N Pen 100 Unit/ml Susp (Insulin isophane human) .Marland KitchenMarland KitchenMarland KitchenMarland Kitchen 70 units at bedtime    Humalog Kwikpen 100 Unit/ml Soln (Insulin lispro (human)) .Marland KitchenMarland KitchenMarland KitchenMarland Kitchen 4 times a day: 30-20-40-15 units    Adult Aspirin Low Strength 81 Mg Tbdp (Aspirin) .Marland Kitchen... Take 1 by mouth qd    Glucagon Emergency 1 Mg Kit (Glucagon (rdna)) .Marland Kitchen... As dir  mgmt as ongoing by endo  Labs Reviewed: Creat: 7.05 (08/17/2010)    Reviewed HgBA1c results: 6.3 (07/26/2010)  7.0 (04/26/2010)  Complete Medication List: 1)  Lipitor 20 Mg Tabs (Atorvastatin calcium) .... Take 1 by mouth qd 2)  Celexa 20 Mg Tabs (Citalopram hydrobromide) .Marland Kitchen.. 1 tab by mouth once daily 3)  Unifine Pentips 31g X 8 Mm Misc (Insulin pen needle) .... Use as directed 4)  Humulin N Pen 100 Unit/ml Susp (Insulin isophane human) .... 70 units at bedtime 5)  Humalog Kwikpen 100 Unit/ml Soln (Insulin lispro (human)) .... 4 times a day: 30-20-40-15 units 6)  Nexium 40 Mg Cpdr (Esomeprazole magnesium) .... Take 1 tablet by mouth two times a day 7)  Bd U/f Short Pen Needle 31g X 8 Mm Misc (Insulin pen needle) .... Ue as directed 8)  Adult Aspirin Low Strength 81 Mg Tbdp (Aspirin) .... Take 1 by mouth qd 9)  Ambien 5 Mg Tabs (Zolpidem tartrate) .Marland Kitchen.. 1 by mouth at bedtime as needed 10)  Phenergan 12.5  .... As needed 11)  Zofran 4 Mg Tabs (Ondansetron hcl) .... As directed 12)  Calcium Acetate 667 Mg Caps (Calcium acetate (phos binder)) .... Take 2 cap before meals 13)  Rena-vite Tabs (B complex-c-folic acid) .... Take 1 tablet by mouth once a day 14)  Hydroxyzine Hcl  25 Mg Tabs (Hydroxyzine hcl) .... Morning of dialysis 15)  Imdur 60 Mg Xr24h-tab (Isosorbide mononitrate) .... Take 1 by mouth qd 16)  Nitroglycerin 0.4 Mg/hr Pt24 (Nitroglycerin) .... Use as needed 17)  Dialysis  .... On mon, wed, and fri 18)  Nepro Liqd (Nutritional supplements) .... Daily 19)  Vitamin From Dialysis Center  .... As directed 20)   Sarna 0.5-0.5 % Lotn (Camphor-menthol) .... As directed 21)  Cpap  .... For obstructive sleep apnea 22)  Glucagon Emergency 1 Mg Kit (Glucagon (rdna)) .... As dir 23)  Stool Softener 100 Mg Caps (Docusate sodium) .... As needed 24)  Effient 10 Mg Tabs (Prasugrel hcl) .... 30 mg x 2 doses then 1 once daily 25)  Ranexa 500 Mg Xr12h-tab (Ranolazine) .... Tale 1 by mouth once daily 26)  Tylenol 325 Mg Tabs (Acetaminophen) .... As needed 27)  Carvedilol 12.5 Mg Tabs (Carvedilol) .... Take 1 two times a day  Patient Instructions: 1)  it was good to see you today. 2)  medications reviewed - things look good - no medication changes today - continue as ongoing 3)  Please schedule a follow-up appointment in 6 months for review of chronic issues and medications, call sooner if problems.    Orders Added: 1)  Est. Patient Level III [60454]   Immunization History:  Influenza Immunization History:    Influenza:  historical given @ dialysis (09/24/2010)   Immunization History:  Influenza Immunization History:    Influenza:  Historical given @ Dialysis (09/24/2010)

## 2011-01-26 NOTE — Progress Notes (Signed)
Summary: Rx request  Phone Note Other Incoming   Caller: Laurette Schimke [facility] [phone] Summary of Call: [facility] called requesting a written Rx for C-PAP with Dx of Obstructive Sleep Apnea in order for Medicare to pay. Rx can be faxed to [phone] Initial call taken by: Margaret Pyle, CMA,  February 04, 2010 12:13 PM  Follow-up for Phone Call        ok -place "written rx" in med list as requested - may print out and fax - thanks Follow-up by: Newt Lukes MD,  February 04, 2010 12:58 PM  Additional Follow-up for Phone Call Additional follow up Details #1::        rx printed and faxed to [facility] Additional Follow-up by: Margaret Pyle, CMA,  February 04, 2010 2:06 PM    New/Updated Medications: * CPAP for obstructive sleep apnea Prescriptions: CPAP for obstructive sleep apnea  #0 x 0   Entered by:   Margaret Pyle, CMA   Authorized by:   Newt Lukes MD   Signed by:   Margaret Pyle, CMA on 02/04/2010   Method used:   Printed then faxed to ...       [facility] [address] [phone]* (retail)       [address] [place], [place]  [address]       Ph: [phone]       Fax: [phone]   RxID:   [id number]

## 2011-01-26 NOTE — Assessment & Plan Note (Signed)
Summary: blood in stool--ch.   History of Present Illness Visit Type: Follow-up Visit Primary GI MD: Sheryn Bison MD FACP FAGA Primary Provider: Newt Lukes MD Chief Complaint: blood in stool from a physical he had, but pt states he has not had any problems or noticed any blood History of Present Illness:   [name] denies any GI complaints. He is on dialysis, has chronic renal failure, chronic cardiac problems with defibrillator in place, insulin-dependent diabetes, and multiple medical problems. He is on twice a day Nexium and stool softeners and p.r.n. Zofran. He is chronically guaiac-positive stools with a negative GI workup within the last 3 years including endoscopy and colonoscopy. He had some small polyps removed which were hyperplastic polyps. Endoscopy showed erosive gastroduodenitis related to his aspirin and chronic renal failure. Has a chronic anemia and is on erythropoietin injections.   GI Review of Systems      Denies abdominal pain, acid reflux, belching, bloating, chest pain, dysphagia with liquids, dysphagia with solids, heartburn, loss of appetite, nausea, vomiting, vomiting blood, weight loss, and  weight gain.      Reports rectal bleeding.     Denies anal fissure, black tarry stools, change in bowel habit, constipation, diarrhea, diverticulosis, fecal incontinence, heme positive stool, hemorrhoids, irritable bowel syndrome, jaundice, light color stool, liver problems, and  rectal pain.    Current Medications (verified): 1)  Lipitor 20 Mg  Tabs (Atorvastatin Calcium) .... Take 1 By Mouth Qd 2)  Celexa 20 Mg Tabs (Citalopram Hydrobromide) .Marland Kitchen.. 1 Tab By Mouth Once Daily 3)  Unifine Pentips 31g X 8 Mm  Misc (Insulin Pen Needle) .... Use As Directed 4)  Humulin N Pen 100 Unit/ml Susp (Insulin Isophane Human) .... 70 Units At Bedtime 5)  Humalog Kwikpen 100 Unit/ml Soln (Insulin Lispro (Human)) .... 4 Times A Day: 30-20-40-15 Units 6)  Nexium 40 Mg Cpdr  (Esomeprazole Magnesium) .... Take 1 Tablet By Mouth Two Times A Day 7)  Bd U/f Short Pen Needle 31g X 8 Mm Misc (Insulin Pen Needle) .... Ue As Directed 8)  Adult Aspirin Low Strength 81 Mg Tbdp (Aspirin) .... Take 1 By Mouth Qd 9)  Ambien 5 Mg Tabs (Zolpidem Tartrate) .Marland Kitchen.. 1 By Mouth At Bedtime As Needed 10)  Phenergan 12.5 .... As Needed 11)  Zofran 4 Mg Tabs (Ondansetron Hcl) .... As Directed 12)  Calcium Acetate 667 Mg Caps (Calcium Acetate (Phos Binder)) .... Take 2 Cap Before Meals 13)  Rena-Vite  Tabs (B Complex-C-Folic Acid) .... Take 1 Tablet By Mouth Once A Day 14)  Hydroxyzine Hcl 25 Mg Tabs (Hydroxyzine Hcl) .... Morning of Dialysis 15)  Imdur 60 Mg Xr24h-Tab (Isosorbide Mononitrate) .... Take 1 By Mouth Qd 16)  Nitroglycerin 0.4 Mg/hr Pt24 (Nitroglycerin) .... Use As Needed 17)  Dialysis .... On Mon, Wed, and Fri 18)  Nepro  Liqd (Nutritional Supplements) .... Daily 19)  Vitamin From Dialysis Center .... As Directed 20)  Sarna 0.5-0.5 % Lotn (Camphor-Menthol) .... As Directed 21)  Cpap .... For Obstructive Sleep Apnea 22)  Glucagon Emergency 1 Mg Kit (Glucagon (Rdna)) .... As Dir 23)  Stool Softener 100 Mg Caps (Docusate Sodium) .... As Needed 24)  Effient 10 Mg Tabs (Prasugrel Hcl) .... 30 Mg X 2 Doses Then 1 Once Daily 25)  Ranexa 500 Mg Xr12h-Tab (Ranolazine) .... Tale 1 By Mouth Once Daily 26)  Tylenol 325 Mg Tabs (Acetaminophen) .... As Needed 27)  Carvedilol 12.5 Mg Tabs (Carvedilol) .... Take 1 Two Times  A Day  Allergies (verified): 1)  ! Procardia 2)  ! Adhesive Tape  Past History:  Family History: Last updated: 03/27/2009 No FH of Colon Cancer: Stomach cancer: Paternal Aunt Family History of Diabetes: Brother, Father Father:Died at age 64 Lung Cancer Mother:Died at age 74 of a stroke  Social History: Last updated: 01/18/2010 Patient has never smoked. Alcohol Use - no Illicit Drug Use - no Patient does not get regular exercise.  Pt is on dialysis - MWF  - since 09/2009 married, lives with spouse, supportive dtr and son nearby  Past medical, surgical, family and social histories (including risk factors) reviewed for relevance to current acute and chronic problems.  Past Medical History: Reviewed history from 08/23/2010 and no changes required. SLEEP APNEA, OBSTRUCTIVE - CPAP qHS ESRD - HD MWF    a/w anemia of chronic dz, hypotension HYPERTENSION hx DYSLIPIDEMIA GERD DIABETES MELLITUS, insulin dep   a/w peripheral neuropathy and gastroparesis CORONARY ARTERY DISEASE s/p CABG 2003 - cath 12/31/09 - occluded sap dx graft, otherwise patent grafts chronic syst CHF - LVEF 35-40% by cath 12/31/09; s/p BiV ICD 07/2010 depression Vit B12 deficency normal pressure hydrocephalus s/p VP shunt 08/2009  MD roster -  endo - ellison  cards - [facility] nishan/taylor GI - [facility] patterson renal - patel nsurg - cram    Past Surgical History: Reviewed history from 08/23/2010 and no changes required. CORONARY ARTERY BYPASS GRAFT, THREE VESSEL, HX OF (2003) CATARACT EXTRACTIONS, BILATERAL, HX OF (ICD-V45.61) Permanent pacemaker  Family History: Reviewed history from 03/27/2009 and no changes required. No FH of Colon Cancer: Stomach cancer: Paternal Aunt Family History of Diabetes: Brother, Father Father:Died at age 46 Lung Cancer Mother:Died at age 5 of a stroke  Social History: Reviewed history from 01/18/2010 and no changes required. Patient has never smoked. Alcohol Use - no Illicit Drug Use - no Patient does not get regular exercise.  Pt is on dialysis - MWF - since 09/2009 married, lives with spouse, supportive dtr and son nearby  Review of Systems  The patient denies allergy/sinus, anemia, anxiety-new, arthritis/joint pain, back pain, blood in urine, breast changes/lumps, change in vision, confusion, cough, coughing up blood, depression-new, fainting, fatigue, fever, headaches-new, hearing problems, heart murmur, heart rhythm changes,  itching, muscle pains/cramps, night sweats, nosebleeds, shortness of breath, skin rash, sleeping problems, sore throat, swelling of feet/legs, swollen lymph glands, thirst - excessive, urination - excessive, urination changes/pain, urine leakage, vision changes, and voice change.    Vital Signs:  Patient profile:   77 year old male Height:      73 inches Weight:      203 pounds BMI:     26.88 BSA:     2.17 Pulse rate:   112 / minute Pulse rhythm:   regular BP sitting:   94 / 54  (left arm)  Vitals Entered By: Merri Ray CMA Duncan Dull) (October 18, 2010 10:48 AM)  Physical Exam  General:  Well developed, well nourished, no acute distress.healthy appearing.   Head:  Normocephalic and atraumatic. Eyes:  PERRLA, no icterus. Lungs:  Clear throughout to auscultation. Heart:  Regular rate and rhythm; no murmurs, rubs,  or bruits.tachycardic but appears to be in a regular rhythm. Abdomen:  Soft, nontender and nondistended. No masses, hepatosplenomegaly or hernias noted. Normal bowel sounds.Bruising of the anterior abdominal wall area with some induration from his insulin shots. Extremities:  No clubbing, cyanosis, edema or deformities noted.trace pedal edema.   Neurologic:  Alert and  oriented x4;  grossly normal neurologically. Psych:  Alert and cooperative. Normal mood and affect.   Impression & Recommendations:  Problem # 1:  ANEMIA, MACROCYTIC (ICD-281.9) Assessment Unchanged he has chronic stable anemia and chronically guaiac positive stools probably related to erosive gastroduodenitis associated with his salicylate therapy, chronic renal failure, dialysis, etc. I see no need for repeat endoscopy and colonoscopy which are extremely high risk procedures for this patient in terms of preparation for these procedures, IV sedation, an endoscopic complications. We will continue Nexium twice a day with p.r.n. followup should he have some type of acute GI issue.  Problem # 2:  RENAL FAILURE,  END STAGE (ICD-585.6) Assessment: Unchanged  Patient Instructions: 1)  Copy sent to : Newt Lukes MD and Dr. Kathrene Bongo in nephrology. And Dr. Charlton Haws in cardiology. 2)  Your prescription(s) have been sent to you pharmacy.  3)  Please continue current medications.  4)  The medication list was reviewed and reconciled.  All changed / newly prescribed medications were explained.  A complete medication list was provided to the patient / caregiver. Prescriptions: NEXIUM 40 MG CPDR (ESOMEPRAZOLE MAGNESIUM) Take 1 tablet by mouth two times a day  #90 x 11   Entered by:   Harlow Mares CMA (AAMA)   Authorized by:   Mardella Layman MD [facility]   Signed by:   Harlow Mares CMA (AAMA) on 10/18/2010   Method used:   Electronically to        [facility] [address] [phone]* (retail)       [address] [place], [place]  [address]       Ph: [phone]       Fax: [phone]   RxID:   [id number]

## 2011-01-26 NOTE — Medication Information (Signed)
Summary: Prior [employer] Approved for Nexium/[facility]  Prior [employer] Approved for Nexium/[facility]   Imported By: Sherian Rein 01/28/2010 09:30:08  _____________________________________________________________________  External Attachment:    Type:   Image     Comment:   External Document

## 2011-01-26 NOTE — Assessment & Plan Note (Signed)
Summary: per check out/sf   Referring Nicklas Mcsweeney:  baxley Primary Nicolet Griffy:  Newt Lukes MD  CC:  chest pain pt took nitro which helped... Severity of pain 5 to 6.  History of Present Illness: Chronically ill dialysis patient with severe depression.  CAD with old CABG.  Cath x 2 this year already in January by Dr Juanda Chance and May be Dr Allyson Sabal.  SEMI 2nd cath.  Medical Rx recommended both times.  Currently not having angina.   Low BP during dialysis.  .  No SOB, palpitaiotns or syncope.  EF 30-35%/.  Previously evaluated by Dr Ladona Ridgel for AICD but put off due to ? risk of infection with dialysis and comorbidities.  ECG with LBBB.  Tends to be tachycardic but higher doses of coreg give him horrible fatigue and make BP even lower at dialysis.  Known carotid disease.  Reviewed duplex from today.  Stable 60-79% RICA stenosis.  Current Problems (verified): 1)  Antihyperlipidemic Use, Long Term  (ICD-V58.69) 2)  Chronic Systolic Heart Failure  (ICD-428.22) 3)  Tachycardia  (ICD-785.0) 4)  Malignant Neoplasm, Skin, Face  (ICD-173.3) 5)  Renal Dialysis Status  (ICD-V45.11) 6)  Renal Failure, End Stage  (ICD-585.6) 7)  Carotid Bruit, Right  (ICD-785.9) 8)  Coronary Artery Bypass Graft, Three Vessel, Hx of  (ICD-V45.81) 9)  Vitamin B12 Deficiency  (ICD-266.2) 10)  Peripheral Neuropathy  (ICD-356.9) 11)  Hx of Right Wrist Surgery.  () 12)  Cataract Extractions, Bilateral, Hx of  (ICD-V45.61) 13)  Sleep Apnea, Obstructive  (ICD-327.23) 14)  Congestive Heart Failure, Hx of  (ICD-V12.50) 15)  Gastroparesis  (ICD-536.3) 16)  Hx of Erosive Gastritis  (ICD-535.40) 17)  Diabetes Mellitus, Type I, Uncontrolled  (ICD-250.03) 18)  Anemia, Macrocytic  (ICD-281.9) 19)  Parkinson's Disease  (ICD-332.0) 20)  Hypercholesterolemia  (ICD-272.0) 21)  Hypertension  (ICD-401.9) 22)  Gerd  (ICD-530.81) 23)  Diabetes Mellitus, Type I  (ICD-250.01) 24)  Depression  (ICD-311) 25)  Coronary Artery Disease   (ICD-414.00) 26)  Colonic Polyps, Hx of  (ICD-V12.72)  Current Medications (verified): 1)  Lipitor 20 Mg  Tabs (Atorvastatin Calcium) .... Take 1 By Mouth Qd 2)  Celexa 20 Mg Tabs (Citalopram Hydrobromide) .Marland Kitchen.. 1 Tab By Mouth Once Daily 3)  Unifine Pentips 31g X 8 Mm  Misc (Insulin Pen Needle) .... Use As Directed 4)  Humulin N Pen 100 Unit/ml Susp (Insulin Isophane Human) .... 70 Units At Bedtime 5)  Humalog Kwikpen 100 Unit/ml Soln (Insulin Lispro (Human)) .... 4 Times A Day: 30-20-40-15 Units 6)  Nexium 40 Mg Cpdr (Esomeprazole Magnesium) .... Take 1 Tablet By Mouth Two Times A Day 7)  Bd U/f Short Pen Needle 31g X 8 Mm Misc (Insulin Pen Needle) .... Ue As Directed 8)  Adult Aspirin Low Strength 81 Mg Tbdp (Aspirin) .... Take 1 By Mouth Qd 9)  Ambien 5 Mg Tabs (Zolpidem Tartrate) .Marland Kitchen.. 1 By Mouth At Bedtime As Needed 10)  Phenergan 12.5 .... As Needed 11)  Zofran 4 Mg Tabs (Ondansetron Hcl) .... As Directed 12)  Calcium Acetate 667 Mg Caps (Calcium Acetate (Phos Binder)) .... Take 2 Cap Before Meals 13)  Rena-Vite  Tabs (B Complex-C-Folic Acid) .... Take 1 Tablet By Mouth Once A Day 14)  Hydroxyzine Hcl 25 Mg Tabs (Hydroxyzine Hcl) .... Morning of Dialysis 15)  Imdur 60 Mg Xr24h-Tab (Isosorbide Mononitrate) .... Take 1 By Mouth Qd 16)  Nitroglycerin 0.4 Mg/hr Pt24 (Nitroglycerin) .... Use As Needed 17)  Carvedilol 25 Mg Tabs (  Carvedilol) .Marland Kitchen.. 1 Two Times A Day 18)  Dialysis .... On Mon, Wed, and Fri 19)  Nepro  Liqd (Nutritional Supplements) .... Daily 20)  Vitamin From Dialysis Center .... As Directed 21)  Sarna 0.5-0.5 % Lotn (Camphor-Menthol) .... As Directed 22)  Cpap .... For Obstructive Sleep Apnea 23)  Glucagon Emergency 1 Mg Kit (Glucagon (Rdna)) .... As Dir 24)  Stool Softener 100 Mg Caps (Docusate Sodium) .... As Needed 25)  Plavix 75 Mg Tabs (Clopidogrel Bisulfate) .... Take One Tablet By Mouth Daily 26)  Effient 10 Mg Tabs (Prasugrel Hcl) .... 30 Mg X 2 Doses Then 1 Once  Daily 27)  Ranexa 500 Mg Xr12h-Tab (Ranolazine) .Marland Kitchen.. 1 Two Times A Day 28)  Tylenol 325 Mg Tabs (Acetaminophen) .... As Needed  Allergies (verified): 1)  ! Procardia 2)  ! Adhesive Tape  Past History:  Past Medical History: Last updated: 07/26/2010 SLEEP APNEA, OBSTRUCTIVE - CPAP qHS ESRD - HD MWF    a/w anemia of chronic dz, hypotension HYPERTENSION hx DYSLIPIDEMIA GERD DIABETES MELLITUS, insulin dep   a/w peripheral neuropathy and gastroparesis CORONARY ARTERY DISEASE s/p CABG 2003 - cath 12/31/09 - occluded sap dx graft, otherwise patent grafts chronic syst CHF - LVEF 35-40% by cath 12/31/09 depression Vit B12 deficency normal pressure hydrocephalus s/p VP shunt 08/2009  MD roster -  endo - ellison cards - [facility] - [facility] - patel nsurg - cram    Past Surgical History: Last updated: 01/18/2010 CORONARY ARTERY BYPASS GRAFT, THREE VESSEL, HX OF (2003) CATARACT EXTRACTIONS, BILATERAL, HX OF (ICD-V45.61)  Family History: Last updated: 03/27/2009 No FH of Colon Cancer: Stomach cancer: Paternal Aunt Family History of Diabetes: Brother, Father Father:Died at age 87 Lung Cancer Mother:Died at age 17 of a stroke  Social History: Last updated: 01/18/2010 Patient has never smoked. Alcohol Use - no Illicit Drug Use - no Patient does not get regular exercise.  Pt is on dialysis - MWF - since 09/2009 married, lives with spouse, supportive dtr and son nearby  Review of Systems       Denies fever, malais, weight loss, blurry vision, decreased visual acuity, cough, sputum, SOB, hemoptysis, pleuritic pain, palpitaitons, heartburn, abdominal pain, melena, lower extremity edema, claudication, or rash.   Vital Signs:  Patient profile:   77 year old male Height:      73 inches Weight:      205 pounds BMI:     27.14 Pulse rate:   105 / minute Resp:     12 per minute BP sitting:   112 / 63  (left arm)  Vitals Entered By: Kem Parkinson (July 26, 2010 1:49 PM)  Physical Exam  General:  Affect appropriate Healthy:  appears stated age HEENT: normal Neck supple with no adenopathy JVP normal bilateral  bruits no thyromegaly Lungs clear with no wheezing and good diaphragmatic  motion systolic  murmur no ,rub, gallop or click PMI normal Abdomen: benighn, BS positve, no tenderness, no AAA no bruit.  No HSM or HJR Distal pulses intact with no bruits No edema Neuro non-focal Skin warm and dry Fistula with good thrill left arm   Impression & Recommendations:  Problem # 1:  CHRONIC SYSTOLIC HEART FAILURE (ICD-428.22) Stable class 3 volume controlled at dalysis His updated medication list for this problem includes:    Adult Aspirin Low Strength 81 Mg Tbdp (Aspirin) .Marland Kitchen... Take 1 by mouth qd    Imdur 60 Mg Xr24h-tab (Isosorbide mononitrate) .Marland Kitchen... Take  1 by mouth qd    Nitroglycerin 0.4 Mg/hr Pt24 (Nitroglycerin) ..... Use as needed    Carvedilol 25 Mg Tabs (Carvedilol) .Marland Kitchen... 1 two times a day    Plavix 75 Mg Tabs (Clopidogrel bisulfate) .Marland Kitchen... Take one tablet by mouth daily    Effient 10 Mg Tabs (Prasugrel hcl) .Marland KitchenMarland KitchenMarland KitchenMarland Kitchen 30 mg x 2 doses then 1 once daily    Ranexa 500 Mg Xr12h-tab (Ranolazine) .Marland Kitchen... 1 two times a day  Problem # 2:  TACHYCARDIA (ICD-785.0) Unable to tolerate more coreg.  Low output.  and has to stay at dialysis for an extra hour as it is for BP to come up Orders: EKG w/ Interpretation (93000)  Problem # 3:  CAROTID BRUIT, RIGHT (ICD-785.9) Stable by duplex.  F/U in 6 months.  Continue ASA  Problem # 4:  CORONARY ARTERY BYPASS GRAFT, THREE VESSEL, HX OF (ICD-V45.81) STable with no angina despite poor hemodynamics.  Consider adding Ranexa if symptoms recur  Patient Instructions: 1)  Your physician recommends that you schedule a follow-up appointment in: 6  months with dr Eden Emms 2)  Your physician recommends that you continue on your current medications as directed. Please refer to the Current Medication list given  to you today.   EKG Report  Procedure date:  07/26/2010  Findings:      Sinus tachycardia 107 LBBB

## 2011-01-26 NOTE — Letter (Signed)
Summary: CMN request for Oxygen/[facility]  CMN request for Oxygen/[facility]   Imported By: Sherian Rein 12/28/2010 09:24:26  _____________________________________________________________________  External Attachment:    Type:   Image     Comment:   External Document

## 2011-01-26 NOTE — Letter (Signed)
Summary: [facility]  [facility]   Imported By: Sherian Rein 08/05/2010 09:36:20  _____________________________________________________________________  External Attachment:    Type:   Image     Comment:   External Document

## 2011-01-26 NOTE — Miscellaneous (Signed)
Summary: Orders Update  Clinical Lists Changes  Orders: Added new Test order of Carotid Duplex (Carotid Duplex) - Signed 

## 2011-01-26 NOTE — Miscellaneous (Signed)
  Clinical Lists Changes  Medications: Added new medication of GLUCAGON EMERGENCY 1 MG KIT (GLUCAGON (RDNA)) as dir - Signed Rx of GLUCAGON EMERGENCY 1 MG KIT (GLUCAGON (RDNA)) as dir;  #1 x 2;  Signed;  Entered by: Minus Breeding MD;  Authorized by: Minus Breeding MD;  Method used: Electronically to [facility] [address] [phone]*, [address] [place], [place]  [address], Ph: [phone], Fax: [phone]    Prescriptions: GLUCAGON EMERGENCY 1 MG KIT (GLUCAGON (RDNA)) as dir  #1 x 2   Entered and Authorized by:   Minus Breeding MD   Signed by:   Minus Breeding MD on 02/10/2010   Method used:   Electronically to        [facility] [address] [phone]* (retail)       [address] [place], [place]  [address]       Ph: [phone]       Fax: [phone]   RxID:   [id number]

## 2011-01-26 NOTE — Assessment & Plan Note (Signed)
Summary: 2 WK FU  STC   Vital Signs:  Patient profile:   77 year old male Height:      73 inches (185.42 cm) Weight:      189.25 pounds (86.02 kg) O2 Sat:      97 % on Room air Temp:     96.4 degrees F (35.78 degrees C) oral Pulse rate:   119 / minute BP sitting:   142 / 70  (left arm) Cuff size:   large  Vitals Entered By: Josph Macho CMA (December 28, 2009 11:22 AM)  O2 Flow:  Room air CC: 2 week follow up/pt states he is no longer tking Cerefolin or Catapres/CF Is Patient Diabetic? Yes   Referring Provider:  baxley Primary Provider:  Sharlet Salina, MD  CC:  2 week follow up/pt states he is no longer tking Cerefolin or Catapres/CF.  History of Present Illness: he brings a record of his cbg's which i have reviewed today.  he says it is still highest in am (300), even with only a minimal hs-snack.   other cbg's vary from 118-300, with no trend throughout the day.    Current Medications (verified): 1)  Norvasc 10 Mg  Tabs (Amlodipine Besylate) .... Take 1 By Mouth Once Daily At Bedtime 2)  Flomax 0.4 Mg  Cp24 (Tamsulosin Hcl) .... Take 1 By Mouth Qd 3)  Lipitor 20 Mg  Tabs (Atorvastatin Calcium) .... Take 1 By Mouth Qd 4)  Toprol Xl 100 Mg  Tb24 (Metoprolol Succinate) .... Take 1 By Mouth Qd 5)  Celexa 20 Mg Tabs (Citalopram Hydrobromide) .Marland Kitchen.. 1 Tab By Mouth Once Daily 6)  Unifine Pentips 31g X 8 Mm  Misc (Insulin Pen Needle) .... Use As Directed 7)  Humulin N Pen 100 Unit/ml Susp (Insulin Isophane Human) .... 60 Units At Bedtime, 8)  Humalog Kwikpen 100 Unit/ml Soln (Insulin Lispro (Human)) .... Qac (Three Times A Day) 25-20-40 Units 9)  Cerefolin 5.635-1-50-5 Mg Tabs (L-Methylfolate-B12-B6-B2) .Marland Kitchen.. 1 Tablet By Mouth Once Daily 10)  Nexium 40 Mg Cpdr (Esomeprazole Magnesium) .... Take 1 Tablet By Mouth Two Times A Day 11)  Bd U/f Short Pen Needle 31g X 8 Mm Misc (Insulin Pen Needle) .... Ue As Directed 12)  Adult Aspirin Low Strength 81 Mg Tbdp (Aspirin) .... Take 1 By  Mouth Qd 13)  Catapres 0.1 Mg Tabs (Clonidine Hcl) .... Morning- Tue, Thurs, Sat, Sun Supper- Mon, Wed, Fri Bedtime- Every Night 14)  Ambien .... As Needed 15)  Phenergan 12.5 .... As Needed 16)  Zofran 4 Mg Tabs (Ondansetron Hcl) .... As Needed 17)  Phengrgan Suppositories .... As Needed 18)  Calcium Acetate 667 Mg Caps (Calcium Acetate (Phos Binder)) .... Take 1 Cap Before Meals 19)  Rena-Vite  Tabs (B Complex-C-Folic Acid) .... Take 1 Tablet By Mouth Once A Day 20)  Hydroxyzine Hcl 25 Mg Tabs (Hydroxyzine Hcl) .... As Needed For Itching 21)  Oxycodone-Acetaminophen 5-500 Mg Caps (Oxycodone-Acetaminophen) .... As Needed  Allergies (verified): 1)  ! Procardia 2)  ! Adhesive Tape  Past History:  Past Medical History: Last updated: 03/27/2009 VITAMIN B12 DEFICIENCY (ICD-266.2) PERIPHERAL NEUROPATHY (ICD-356.9) SLEEP APNEA, OBSTRUCTIVE (ICD-327.23) CONGESTIVE HEART FAILURE, HX OF (ICD-V12.50) GASTROPARESIS (ICD-536.3) FEVER UNSPECIFIED (ICD-780.60) NAUSEA AND VOMITING (ICD-787.01) Hx of EROSIVE GASTRITIS (ICD-535.40) DIABETES MELLITUS, TYPE I, UNCONTROLLED (ICD-250.03) ANEMIA, MACROCYTIC (ICD-281.9) PARKINSON'S DISEASE (ICD-332.0) HYPERCHOLESTEROLEMIA (ICD-272.0) RENAL INSUFFICIENCY (ICD-588.9) HYPERTENSION (ICD-401.9) GERD (ICD-530.81) DIABETES MELLITUS, TYPE I (ICD-250.01) DEPRESSION (ICD-311) CORONARY ARTERY DISEASE (ICD-414.00) COLONIC POLYPS, HX OF (  ICD-V12.72)    Review of Systems  The patient denies hypoglycemia.    Physical Exam  General:  normal appearance.  no distress  Psych:  Alert and cooperative; normal mood and affect; normal attention span and concentration.     Impression & Recommendations:  Problem # 1:  DIABETES MELLITUS, TYPE I, UNCONTROLLED (ICD-250.03) needs increased rx.  it is unusual for pt to need so much overnight insulin  Medications Added to Medication List This Visit: 1)  Humulin N Pen 100 Unit/ml Susp (Insulin isophane human) ....  75 units at bedtime,  Other Orders: Diabetic Clinic Referral (Diabetic) Est. Patient Level III (72536)  Patient Instructions: 1)  take novolog (just before each meal) 25-20-40-5 units (the 5 units is if you eat a bedtime snack).  2)  take extra novolog 5 units as needed for any sugar over 300. 3)  Please schedule a follow-up appointment in 2 weeks. 4)  increase nph to 75 units at night. 5)  check your blood sugar 4 times a day--before the 3 meals, and at bedtime.  also check if you have symptoms of your blood sugar being too high or too low.  please keep a record of the readings and bring it to your next appointment here.  please call us sooner if you are having low blood sugar episodes. 6)  refer to diabetes education specialist, for continuous glucose monitor.

## 2011-02-09 ENCOUNTER — Encounter: Payer: Self-pay | Admitting: Internal Medicine

## 2011-02-09 ENCOUNTER — Ambulatory Visit: Payer: Self-pay | Admitting: Endocrinology

## 2011-02-09 ENCOUNTER — Ambulatory Visit (INDEPENDENT_AMBULATORY_CARE_PROVIDER_SITE_OTHER): Payer: Medicare Other | Admitting: Internal Medicine

## 2011-02-09 DIAGNOSIS — N186 End stage renal disease: Secondary | ICD-10-CM

## 2011-02-09 DIAGNOSIS — L259 Unspecified contact dermatitis, unspecified cause: Secondary | ICD-10-CM | POA: Insufficient documentation

## 2011-02-09 DIAGNOSIS — R000 Tachycardia, unspecified: Secondary | ICD-10-CM

## 2011-02-09 DIAGNOSIS — E1065 Type 1 diabetes mellitus with hyperglycemia: Secondary | ICD-10-CM

## 2011-02-13 ENCOUNTER — Encounter (INDEPENDENT_AMBULATORY_CARE_PROVIDER_SITE_OTHER): Payer: Self-pay | Admitting: *Deleted

## 2011-02-14 ENCOUNTER — Encounter: Payer: Self-pay | Admitting: Endocrinology

## 2011-02-14 ENCOUNTER — Encounter (INDEPENDENT_AMBULATORY_CARE_PROVIDER_SITE_OTHER): Payer: Self-pay | Admitting: *Deleted

## 2011-02-14 ENCOUNTER — Ambulatory Visit (INDEPENDENT_AMBULATORY_CARE_PROVIDER_SITE_OTHER): Payer: Medicare Other | Admitting: Endocrinology

## 2011-02-14 ENCOUNTER — Encounter: Payer: Self-pay | Admitting: Gastroenterology

## 2011-02-14 ENCOUNTER — Other Ambulatory Visit: Payer: Medicare Other

## 2011-02-14 ENCOUNTER — Other Ambulatory Visit: Payer: Self-pay | Admitting: Endocrinology

## 2011-02-14 DIAGNOSIS — E109 Type 1 diabetes mellitus without complications: Secondary | ICD-10-CM

## 2011-02-15 NOTE — Assessment & Plan Note (Signed)
Summary: FU LB   Vital Signs:  Patient profile:   77 year old male Weight:      206.4 pounds (93.82 kg) O2 Sat:      96 % on Room air Temp:     98.6 degrees F (37.00 degrees C) oral Pulse rate:   118 / minute BP sitting:   110 / 70  (right arm) Cuff size:   regular  Vitals Entered By: Orlan Leavens RMA (February 09, 2011 11:19 AM)  O2 Flow:  Room air CC: follow-up visit Is Patient Diabetic? Yes Did you bring your meter with you today? No Pain Assessment Patient in pain? no        Primary Care Provider:  Newt Lukes MD  CC:  follow-up visit.  History of Present Illness: here for f/u  reviewed chronic med issues  bradycardia event 07/2010 (intermitt CHB) s/p BivICD for same -  stable with no cardiac issues since that time - not dizzy, syncope or SOB  ESRD - HD MWF - begun 09/2009 - no UOP occ hypotension since dc from hosp 01/02/10 a/w HD events s/p med changes for BP/card meds - no syncope or dizzy symptoms   DM2 - follows with endo for same - reports cbgs range variable, checks sugars 2-3 x/day- compliant with insulin as rx'd no signs or symptoms hypoglycemia - highest sugar at PM meal  depression hx -  well controlled reports compliance with ongoing medical treatment - celexa denies adverse side effects related to current therapy.  no active depression signs or symptoms per pt and wife  CAD - cath 12/31/09 without sig occlusion of prior CABG grafts feels better since stopping amio and getting BiV ICD 07/2010    Clinical Review Panels:  Immunizations   Last Tetanus Booster:  Historical (12/25/2008)   Last Flu Vaccine:  Historical given @ Dialysis (09/24/2010)   Last Pneumovax:  Historical (12/25/2008)  Diabetes Management   HgBA1C:  6.3 (07/26/2010)   Creatinine:  7.05 (08/17/2010)   Last Flu Vaccine:  Historical given @ Dialysis (09/24/2010)   Last Pneumovax:  Historical (12/25/2008)  CBC   WBC:  4.4 (08/17/2010)   RBC:  2.19 (08/17/2010)   Hgb:   8.9 (08/17/2010)   Hct:  26.6 (08/17/2010)   Platelets:  123 (08/17/2010)   MCV  100.5 (08/17/2010)   MCHC  35.1 (10/27/2008)   RDW  16.2 (08/17/2010)   PMN:  75 (08/14/2010)   Lymphs:  12.7 (10/27/2008)   Monos:  8 (08/14/2010)   Eosinophils:  0 (08/14/2010)   Basophil:  0 (08/14/2010)  Complete Metabolic Panel   Glucose:  176 (08/17/2010)   Sodium:  135 (08/17/2010)   Potassium:  4.0 (08/17/2010)   Chloride:  98 (08/17/2010)   CO2:  28 (08/17/2010)   BUN:  37 (08/17/2010)   Creatinine:  7.05 (08/17/2010)   Albumin:  2.9 (08/17/2010)   Total Protein:  7.4 (07/26/2010)   Calcium:  8.3 (08/17/2010)   Total Bili:  1.0 (07/26/2010)   Alk Phos:  20 (07/26/2010)   SGPT (ALT):  21 (07/26/2010)   SGOT (AST):  23 (07/26/2010)   Current Medications (verified): 1)  Lipitor 20 Mg  Tabs (Atorvastatin Calcium) .... Take 1 By Mouth Qd 2)  Celexa 20 Mg Tabs (Citalopram Hydrobromide) .Marland Kitchen.. 1 Tab By Mouth Once Daily 3)  Unifine Pentips 31g X 8 Mm  Misc (Insulin Pen Needle) .... Use As Directed 4)  Humulin N Pen 100 Unit/ml Susp (Insulin Isophane Human) .... 70  Units At Bedtime 5)  Humalog Kwikpen 100 Unit/ml Soln (Insulin Lispro (Human)) .... 4 Times A Day: 30-20-40-15 Units 6)  Nexium 40 Mg Cpdr (Esomeprazole Magnesium) .... Take 1 Tablet By Mouth Two Times A Day 7)  Bd U/f Short Pen Needle 31g X 8 Mm Misc (Insulin Pen Needle) .... Ue As Directed 8)  Adult Aspirin Low Strength 81 Mg Tbdp (Aspirin) .... Take 1 By Mouth Qd 9)  Ambien 5 Mg Tabs (Zolpidem Tartrate) .Marland Kitchen.. 1 By Mouth At Bedtime As Needed 10)  Phenergan 12.5 .... As Needed 11)  Zofran 4 Mg Tabs (Ondansetron Hcl) .... As Directed 12)  Calcium Acetate 667 Mg Caps (Calcium Acetate (Phos Binder)) .... Take 2 Cap Before Meals 13)  Rena-Vite  Tabs (B Complex-C-Folic Acid) .... Take 1 Tablet By Mouth Once A Day 14)  Hydroxyzine Hcl 25 Mg Tabs (Hydroxyzine Hcl) .... Morning of Dialysis 15)  Imdur 60 Mg Xr24h-Tab (Isosorbide Mononitrate)  .... Take 1 By Mouth Qd 16)  Nitroglycerin 0.4 Mg/hr Pt24 (Nitroglycerin) .... Use As Needed 17)  Dialysis .... On Mon, Wed, and Fri 18)  Nepro  Liqd (Nutritional Supplements) .... Daily 19)  Vitamin From Dialysis Center .... As Directed 20)  Sarna 0.5-0.5 % Lotn (Camphor-Menthol) .... As Directed 21)  Cpap .... For Obstructive Sleep Apnea 22)  Glucagon Emergency 1 Mg Kit (Glucagon (Rdna)) .... As Dir 23)  Stool Softener 100 Mg Caps (Docusate Sodium) .... As Needed 24)  Effient 10 Mg Tabs (Prasugrel Hcl) .... 30 Mg X 2 Doses Then 1 Once Daily 25)  Ranexa 500 Mg Xr12h-Tab (Ranolazine) .... Tale 1 By Mouth Once Daily 26)  Tylenol 325 Mg Tabs (Acetaminophen) .... As Needed 27)  Toprol Xl 50 Mg Xr24h-Tab (Metoprolol Succinate) .... Take 1/2 Tablet Daily X 2 Weeks, Then Increase To 1 Tablet Daily  Allergies (verified): 1)  ! Procardia 2)  ! Adhesive Tape  Past History:  Past Medical History: SLEEP APNEA, OBSTRUCTIVE - CPAP qHS ESRD - HD MWF    a/w anemia of chronic dz, hypotension  HYPERTENSION hx DYSLIPIDEMIA GERD DIABETES MELLITUS, insulin dep   a/w peripheral neuropathy and gastroparesis CORONARY ARTERY DISEASE s/p CABG 2003 - cath 12/31/09 - occluded sap dx graft, otherwise patent grafts chronic syst CHF - LVEF 35-40% by cath 12/31/09; s/p BiV ICD 07/2010 depression Vit B12 deficency normal pressure hydrocephalus s/p VP shunt 08/2009  MD roster -   endo - ellison  cards - [facility] nishan/[facility] GI - [facility] renal - patel nsurg - cram    Review of Systems  The patient denies fever, weight loss, weight gain, chest pain, syncope, headaches, and abdominal pain.    Physical Exam  General:  alert, well-developed, well-nourished, and cooperative to examination.   spouse at side Lungs:  normal respiratory effort, no intercostal retractions or use of accessory muscles; normal breath sounds bilaterally - no crackles and no wheezes.    Heart:  normal rate (high end of normal),  regular rhythm, no murmur, and no rub. BLE without edema.  Psych:  Oriented X3, memory intact for recent and remote, normally interactive, good eye contact, not anxious appearing, not depressed appearing, and not agitated.      Impression & Recommendations:  Problem # 1:  DIABETES MELLITUS, TYPE I, UNCONTROLLED (ICD-250.03)  His updated medication list for this problem includes:    Humulin N Pen 100 Unit/ml Susp (Insulin isophane human) .Marland KitchenMarland KitchenMarland KitchenMarland Kitchen 10units in morning and 70 units at bedtime    Humalog Kwikpen 100  Unit/ml Soln (Insulin lispro (human)) .Marland KitchenMarland KitchenMarland KitchenMarland Kitchen 4 times a day: 30-20-40-15 units    Adult Aspirin Low Strength 81 Mg Tbdp (Aspirin) .Marland Kitchen... Take 1 by mouth qd    Glucagon Emergency 1 Mg Kit (Glucagon (rdna)) .Marland Kitchen... As dir  mgmt ongoing by endo but next apt not until next week - will add AM NPH dose to help with late day hyperglycemia - no other changes - keep endo f/u as planned  Labs Reviewed: Creat: 7.05 (08/17/2010)    Reviewed HgBA1c results: 6.3 (07/26/2010)  7.0 (04/26/2010)  Problem # 2:  TACHYCARDIA (ICD-785.0)  Unable to tolerate more coreg.  Low output and has to stay at dialysis for an extra hour as it is for BP to come up reassurance offered to pt/wife - overall asymptomatic  Problem # 3:  RENAL FAILURE, END STAGE (ICD-585.6)  HD MWF with cont hypotension s/p HD - per renal and cards  Labs Reviewed: BUN: 37 (08/17/2010)   Cr: 7.05 (08/17/2010)    Hgb: 8.9 (08/17/2010)   Hct: 26.6 (08/17/2010)   Ca++: 8.3 (08/17/2010)    TP: 7.4 (07/26/2010)   Alb: 2.9 (08/17/2010)  Problem # 4:  DERMATITIS (ICD-692.9)  His updated medication list for this problem includes:    Betamethasone Dipropionate 0.05 % Lotn (Betamethasone dipropionate) .Marland Kitchen... Apply two times a day as needed to affected area  Orders: Prescription Created Electronically [phone])  Discussed avoidance of triggers and symptomatic treatment.   Complete Medication List: 1)  Lipitor 20 Mg Tabs (Atorvastatin  calcium) .... Take 1 by mouth qd 2)  Celexa 20 Mg Tabs (Citalopram hydrobromide) .Marland Kitchen.. 1 tab by mouth once daily 3)  Unifine Pentips 31g X 8 Mm Misc (Insulin pen needle) .... Use as directed 4)  Humulin N Pen 100 Unit/ml Susp (Insulin isophane human) .Marland Kitchen.. 10units in morning and 70 units at bedtime 5)  Humalog Kwikpen 100 Unit/ml Soln (Insulin lispro (human)) .... 4 times a day: 30-20-40-15 units 6)  Nexium 40 Mg Cpdr (Esomeprazole magnesium) .... Take 1 tablet by mouth two times a day 7)  Bd U/f Short Pen Needle 31g X 8 Mm Misc (Insulin pen needle) .... Ue as directed 8)  Adult Aspirin Low Strength 81 Mg Tbdp (Aspirin) .... Take 1 by mouth qd 9)  Ambien 5 Mg Tabs (Zolpidem tartrate) .Marland Kitchen.. 1 by mouth at bedtime as needed 10)  Phenergan 12.5  .... As needed 11)  Zofran 4 Mg Tabs (Ondansetron hcl) .... As directed 12)  Calcium Acetate 667 Mg Caps (Calcium acetate (phos binder)) .... Take 2 cap before meals 13)  Rena-vite Tabs (B complex-c-folic acid) .... Take 1 tablet by mouth once a day 14)  Hydroxyzine Hcl 25 Mg Tabs (Hydroxyzine hcl) .... Morning of dialysis 15)  Imdur 60 Mg Xr24h-tab (Isosorbide mononitrate) .... Take 1 by mouth qd 16)  Nitroglycerin 0.4 Mg/hr Pt24 (Nitroglycerin) .... Use as needed 17)  Dialysis  .... On mon, wed, and fri 18)  Nepro Liqd (Nutritional supplements) .... Daily 19)  Vitamin From Dialysis Center  .... As directed 20)  Sarna 0.5-0.5 % Lotn (Camphor-menthol) .... As directed 21)  Cpap  .... For obstructive sleep apnea 22)  Glucagon Emergency 1 Mg Kit (Glucagon (rdna)) .... As dir 23)  Stool Softener 100 Mg Caps (Docusate sodium) .... As needed 24)  Effient 10 Mg Tabs (Prasugrel hcl) .... 30 mg x 2 doses then 1 once daily 25)  Ranexa 500 Mg Xr12h-tab (Ranolazine) .... Tale 1 by mouth once daily 26)  Tylenol 325  Mg Tabs (Acetaminophen) .... As needed 27)  Toprol Xl 50 Mg Xr24h-tab (Metoprolol succinate) .... Take 1/2 tablet daily x 2 weeks, then increase to 1  tablet daily 28)  Betamethasone Dipropionate 0.05 % Lotn (Betamethasone dipropionate) .... Apply two times a day as needed to affected area  Patient Instructions: 1)  it was good to see you today. 2)  medications and interval history reviewed -  3)  add 10units of N to AM and continue 70U bedtime as ongoing 4)  same kwickpen dose humalog 4x/day - keep followup with dr. Everardo All as planned 5)  new prescription steroid lotion as requested 6)  no other medication changes today - continue as ongoing 7)  Please schedule a follow-up appointment in 6 months for review of chronic issues and medications, call sooner if problems.  Prescriptions: BETAMETHASONE DIPROPIONATE 0.05 % LOTN (BETAMETHASONE DIPROPIONATE) apply two times a day as needed to affected area  #1 x 1   Entered and Authorized by:   Newt Lukes MD   Signed by:   Newt Lukes MD on 02/09/2011   Method used:   Electronically to        [facility] [address] [phone]* (retail)       [address] [place], [place]  [address]       Ph: [phone]       Fax: [phone]   RxID:   [id number]    Orders Added: 1)  Est. Patient Level IV [47425] 2)  Prescription Created Electronically [phone]

## 2011-02-21 ENCOUNTER — Encounter: Payer: Self-pay | Admitting: Gastroenterology

## 2011-02-21 ENCOUNTER — Encounter (INDEPENDENT_AMBULATORY_CARE_PROVIDER_SITE_OTHER): Payer: Medicare Other | Admitting: Internal Medicine

## 2011-02-21 ENCOUNTER — Encounter: Payer: Self-pay | Admitting: Internal Medicine

## 2011-02-21 DIAGNOSIS — I2589 Other forms of chronic ischemic heart disease: Secondary | ICD-10-CM

## 2011-02-21 DIAGNOSIS — I5022 Chronic systolic (congestive) heart failure: Secondary | ICD-10-CM

## 2011-02-21 NOTE — Assessment & Plan Note (Signed)
Summary: 3 Month folllow up - lb   Vital Signs:  Patient profile:   77 year old male Height:      73 inches (185.42 cm) Weight:      205.25 pounds (93.30 kg) BMI:     27.18 O2 Sat:      96 % on Room air Temp:     97.9 degrees F (36.61 degrees C) oral Pulse rate:   120 / minute BP sitting:   132 / 70  (left arm) Cuff size:   regular  Vitals Entered By: Brenton Grills CMA Duncan Dull) (February 14, 2011 11:56 AM)  O2 Flow:  Room air CC: 3 month F/U/aj Is Patient Diabetic? Yes   Referring Provider:  Fayrene Fearing Deterding,MD Primary Provider:  Newt Lukes MD  CC:  3 month F/U/aj.  History of Present Illness: pt states he feels well in general.  he usually eats at hs (not out of fear of hypoglycemia).  he brings a record of his cbg's which i have reviewed today.  it varies from 95 (lunch) to 200 (am), but was as low as 111 in am once.    Current Medications (verified): 1)  Lipitor 20 Mg  Tabs (Atorvastatin Calcium) .... Take 1 By Mouth Qd 2)  Celexa 20 Mg Tabs (Citalopram Hydrobromide) .Marland Kitchen.. 1 Tab By Mouth Once Daily 3)  Unifine Pentips 31g X 8 Mm  Misc (Insulin Pen Needle) .... Use As Directed 4)  Humulin N Pen 100 Unit/ml Susp (Insulin Isophane Human) .Marland Kitchen.. 10units in Morning and 70 Units At Bedtime 5)  Humalog Kwikpen 100 Unit/ml Soln (Insulin Lispro (Human)) .... 4 Times A Day: 30-20-40-15 Units 6)  Nexium 40 Mg Cpdr (Esomeprazole Magnesium) .... Take 1 Tablet By Mouth Two Times A Day 7)  Bd U/f Short Pen Needle 31g X 8 Mm Misc (Insulin Pen Needle) .... Ue As Directed 8)  Adult Aspirin Low Strength 81 Mg Tbdp (Aspirin) .... Take 1 By Mouth Qd 9)  Ambien 5 Mg Tabs (Zolpidem Tartrate) .Marland Kitchen.. 1 By Mouth At Bedtime As Needed 10)  Phenergan 12.5 .... As Needed 11)  Zofran 4 Mg Tabs (Ondansetron Hcl) .... As Directed 12)  Calcium Acetate 667 Mg Caps (Calcium Acetate (Phos Binder)) .Marland Kitchen.. 1 Capsule At Dinner 13)  Rena-Vite  Tabs (B Complex-C-Folic Acid) .... Take 1 Tablet By Mouth Once A  Day 14)  Hydroxyzine Hcl 25 Mg Tabs (Hydroxyzine Hcl) .... Morning of Dialysis 15)  Imdur 60 Mg Xr24h-Tab (Isosorbide Mononitrate) .... Take 1 By Mouth Qd 16)  Nitroglycerin 0.4 Mg/hr Pt24 (Nitroglycerin) .... Use As Needed 17)  Dialysis .... On Mon, Wed, and Fri 18)  Nepro  Liqd (Nutritional Supplements) .... Daily 19)  Vitamin From Dialysis Center .... As Directed 20)  Sarna 0.5-0.5 % Lotn (Camphor-Menthol) .... As Directed 21)  Cpap .... For Obstructive Sleep Apnea 22)  Glucagon Emergency 1 Mg Kit (Glucagon (Rdna)) .... As Dir 23)  Stool Softener 100 Mg Caps (Docusate Sodium) .... As Needed 24)  Effient 10 Mg Tabs (Prasugrel Hcl) .... 30 Mg X 2 Doses Then 1 Once Daily 25)  Ranexa 500 Mg Xr12h-Tab (Ranolazine) .... Tale 1 By Mouth Once Daily 26)  Tylenol 325 Mg Tabs (Acetaminophen) .... As Needed 27)  Toprol Xl 50 Mg Xr24h-Tab (Metoprolol Succinate) .... Take 1/2 Tablet Daily X 2 Weeks, Then Increase To 1 Tablet Daily 28)  Betamethasone Dipropionate 0.05 % Lotn (Betamethasone Dipropionate) .... Apply Two Times A Day As Needed To Affected Area  Allergies (verified):  1)  ! Procardia 2)  ! Adhesive Tape  Past History:  Past Medical History: Last updated: 02/09/2011 SLEEP APNEA, OBSTRUCTIVE - CPAP qHS ESRD - HD MWF    a/w anemia of chronic dz, hypotension  HYPERTENSION hx DYSLIPIDEMIA GERD DIABETES MELLITUS, insulin dep   a/w peripheral neuropathy and gastroparesis CORONARY ARTERY DISEASE s/p CABG 2003 - cath 12/31/09 - occluded sap dx graft, otherwise patent grafts chronic syst CHF - LVEF 35-40% by cath 12/31/09; s/p BiV ICD 07/2010 depression Vit B12 deficency normal pressure hydrocephalus s/p VP shunt 08/2009  MD roster -   endo - Herlinda Heady  cards - [facility] nishan/taylor GI - [facility] patterson renal - patel nsurg - cram    Physical Exam  General:  normal appearance.   Psych:  Alert and cooperative; normal mood and affect; normal attention span and concentration.   Additional Exam:   Fructosamine         [H]  357 umol/L  (converts to a1c of 7.7)  Hemoglobin A1C            6.5 %     Impression & Recommendations:  Problem # 1:  DIABETES MELLITUS, TYPE I, UNCONTROLLED (ICD-250.03) the a1c suggests more glycemia control then the fructosamine does.  Medications Added to Medication List This Visit: 1)  Humulin N Pen 100 Unit/ml Susp (Insulin isophane human) .... 75 units at bedtime 2)  Humalog Kwikpen 100 Unit/ml Soln (Insulin lispro (human)) .... 4 times a day: 30-20-45-15 units 3)  Calcium Acetate 667 Mg Caps (Calcium acetate (phos binder)) .Marland Kitchen.. 1 capsule at dinner  Other Orders: T-Fructosamine ([address]) TLB-A1C / Hgb A1C (Glycohemoglobin) (83036-A1C) Est. Patient Level III (09811)  Patient Instructions: 1)  tests are being ordered for you today.  a few days after the test(s), please call [phone] to hear your test results. 2)  pending the test results, please increase novolog to (just before each meal) 30-20-45-15 units (the 15 units is if you eat a bedtime snack).  3)  take extra novolog 5 units as needed for any sugar over 300. 4)  Please schedule a follow-up appointment in 3 months. 5)  change nph to 75 units at night only. 6)  check your blood sugar 4 times a day--before the 3 meals, and at bedtime.  also check if you have symptoms of your blood sugar being too high or too low.  please keep a record of the readings and bring it to your next appointment here.  please call us sooner if you are having low blood sugar episode 7)  (update: i left message on phone-tree:  rx as we discussed)   Orders Added: 1)  T-Fructosamine [82955-82750] 2)  TLB-A1C / Hgb A1C (Glycohemoglobin) [83036-A1C] 3)  Est. Patient Level III [56213]

## 2011-02-21 NOTE — Letter (Signed)
Summary: Device-Delinquent Phone Journalist, newspaper, Main Office  [address]   [place], [place] [address]   Phone: [phone]  Fax: [phone]     February 13, 2011 MRN: [id number]   [name] [address] [place], [place]  [address]   Dear Mr. POORMAN,  According to our records, you were scheduled for a device phone transmission on 02-09-2011.     We did not receive any results from this check.  If you transmitted on your scheduled day, please call us to help troubleshoot your system.  If you forgot to send your transmission, please send one upon receipt of this letter.  Thank you,   Architectural technologist [facility]

## 2011-02-21 NOTE — Medication Information (Signed)
Summary: Nexium approved/[employer]  Nexium approved/[employer]   Imported By: Sherian Rein 02/17/2011 12:45:24  _____________________________________________________________________  External Attachment:    Type:   Image     Comment:   External Document

## 2011-03-02 NOTE — Cardiovascular Report (Signed)
Summary: Office Visit   Office Visit   Imported By: Roderic Ovens 02/24/2011 12:48:27  _____________________________________________________________________  External Attachment:    Type:   Image     Comment:   External Document

## 2011-03-02 NOTE — Miscellaneous (Signed)
Summary: Change in PPI  Clinical Lists Changes  Medications: Changed medication from NEXIUM 40 MG CPDR (ESOMEPRAZOLE MAGNESIUM) Take 1 tablet by mouth two times a day to OMEPRAZOLE 40 MG CPDR (OMEPRAZOLE) take one by mouth two times a day - Signed Rx of OMEPRAZOLE 40 MG CPDR (OMEPRAZOLE) take one by mouth two times a day;  #60 x 6;  Signed;  Entered by: Harlow Mares CMA (AAMA);  Authorized by: Mardella Layman MD [facility];  Method used: Electronically to [facility] [address] [phone]*, [address] [place], [place]  [address], Ph: [phone], Fax: [phone]    Prescriptions: OMEPRAZOLE 40 MG CPDR (OMEPRAZOLE) take one by mouth two times a day  #60 x 6   Entered by:   Harlow Mares CMA (AAMA)   Authorized by:   Mardella Layman MD [facility]   Signed by:   Harlow Mares CMA (AAMA) on 02/21/2011   Method used:   Electronically to        [facility] [address] [phone]* (retail)       [address] [place], [place]  [address]       Ph: [phone]       Fax: [phone]   RxID:   [id number]

## 2011-03-07 NOTE — Assessment & Plan Note (Signed)
Summary: F3M/AMD/pla   Visit Type:  Follow up Referring Provider:  Fayrene Fearing Deterding,MD Primary Provider:  Newt Lukes MD   History of Present Illness: [name] returns today for followup.  He is a pleasant 77 yo man with a h/o intermittent CHB, CHF, LBBB, and recently underwent BiV ICD implant.  He returns today for followup.  He notes periods of weakness, fatigue and near syncope, particularly after dialysis.  He has not had frank syncope or and ICD discharge.  No peripheral edema. His wife notes that he sits most of the day. When I ask him why, he states that he does not feel like doing this.  Current Medications (verified): 1)  Lipitor 20 Mg  Tabs (Atorvastatin Calcium) .... Take 1 By Mouth Qd 2)  Celexa 20 Mg Tabs (Citalopram Hydrobromide) .Marland Kitchen.. 1 Tab By Mouth Once Daily 3)  Unifine Pentips 31g X 8 Mm  Misc (Insulin Pen Needle) .... Use As Directed 4)  Humulin N Pen 100 Unit/ml Susp (Insulin Isophane Human) .... 75 Units At Bedtime 5)  Humalog Kwikpen 100 Unit/ml Soln (Insulin Lispro (Human)) .... 4 Times A Day: 30-20-45-15 Units 6)  Bd U/f Short Pen Needle 31g X 8 Mm Misc (Insulin Pen Needle) .... Ue As Directed 7)  Adult Aspirin Low Strength 81 Mg Tbdp (Aspirin) .... Take 1 By Mouth Qd 8)  Ambien 5 Mg Tabs (Zolpidem Tartrate) .Marland Kitchen.. 1 By Mouth At Bedtime As Needed 9)  Phenergan 12.5 .... As Needed 10)  Zofran 4 Mg Tabs (Ondansetron Hcl) .... As Directed 11)  Calcium Acetate 667 Mg Caps (Calcium Acetate (Phos Binder)) .Marland Kitchen.. 1 Capsule At Dinner 12)  Rena-Vite  Tabs (B Complex-C-Folic Acid) .... Take 1 Tablet By Mouth Once A Day 13)  Hydroxyzine Hcl 25 Mg Tabs (Hydroxyzine Hcl) .... Morning of Dialysis 14)  Imdur 60 Mg Xr24h-Tab (Isosorbide Mononitrate) .... Take 1 By Mouth Qd 15)  Nitroglycerin 0.4 Mg/hr Pt24 (Nitroglycerin) .... Use As Needed 16)  Dialysis .... On Mon, Wed, and Fri 17)  Nepro  Liqd (Nutritional Supplements) .... Daily 18)  Vitamin From Dialysis Center .... As  Directed 19)  Sarna 0.5-0.5 % Lotn (Camphor-Menthol) .... As Directed 20)  Cpap .... For Obstructive Sleep Apnea 21)  Glucagon Emergency 1 Mg Kit (Glucagon (Rdna)) .... As Dir 22)  Stool Softener 100 Mg Caps (Docusate Sodium) .... As Needed 23)  Effient 10 Mg Tabs (Prasugrel Hcl) .... 30 Mg X 2 Doses Then 1 Once Daily 24)  Ranexa 500 Mg Xr12h-Tab (Ranolazine) .... Tale 1 By Mouth Once Daily 25)  Tylenol 325 Mg Tabs (Acetaminophen) .... As Needed 26)  Toprol Xl 50 Mg Xr24h-Tab (Metoprolol Succinate) .... Take 1/2 Tablet Daily X 2 Weeks, Then Increase To 1 Tablet Daily 27)  Betamethasone Dipropionate 0.05 % Lotn (Betamethasone Dipropionate) .... Apply Two Times A Day As Needed To Affected Area 28)  Nexium 40 Mg Cpdr (Esomeprazole Magnesium) .... One Tablet Two Times A Day  Allergies (verified): 1)  ! Procardia 2)  ! Adhesive Tape  Past History:  Past Medical History: Last updated: 02/09/2011 SLEEP APNEA, OBSTRUCTIVE - CPAP qHS ESRD - HD MWF    a/w anemia of chronic dz, hypotension  HYPERTENSION hx DYSLIPIDEMIA GERD DIABETES MELLITUS, insulin dep   a/w peripheral neuropathy and gastroparesis CORONARY ARTERY DISEASE s/p CABG 2003 - cath 12/31/09 - occluded sap dx graft, otherwise patent grafts chronic syst CHF - LVEF 35-40% by cath 12/31/09; s/p BiV ICD 07/2010 depression Vit B12 deficency normal pressure  hydrocephalus s/p VP shunt 08/2009  MD roster -   endo - ellison  cards - [facility] - [facility] - patel nsurg - cram    Past Surgical History: Last updated: 08/23/2010 CORONARY ARTERY BYPASS GRAFT, THREE VESSEL, HX OF (2003) CATARACT EXTRACTIONS, BILATERAL, HX OF (ICD-V45.61) Permanent pacemaker  Family History: Last updated: 03/27/2009 No FH of Colon Cancer: Stomach cancer: Paternal Aunt Family History of Diabetes: Brother, Father Father:Died at age 20 Lung Cancer Mother:Died at age 28 of a stroke  Social History: Last updated: 01/18/2010 Patient  has never smoked. Alcohol Use - no Illicit Drug Use - no Patient does not get regular exercise.  Pt is on dialysis - MWF - since 09/2009 married, lives with spouse, supportive dtr and son nearby  Risk Factors: Exercise: no (10/27/2008)  Risk Factors: Smoking Status: never (10/27/2008)  Review of Systems       See HPI.  Vital Signs:  Patient profile:   77 year old male Height:      73 inches Weight:      205 pounds BMI:     27.14 Pulse rate:   120 / minute BP sitting:   110 / 60  (left arm) Cuff size:   regular  Vitals Entered By: Bishop Dublin, CMA (February 21, 2011 2:07 PM)  Physical Exam  General:  normal appearance.   Head:  Normocephalic and atraumatic. Eyes:  PERRLA, no icterus. Mouth:  No deformity or lesions, dentition normal. Neck:  Supple; no masses or thyromegaly. Chest Wall:  no hematoma or inflammation signs at site of new BiV ICD right chest wall - c/d/i w/o drainage Lungs:  normal respiratory effort, no intercostal retractions or use of accessory muscles; normal breath sounds bilaterally - no crackles and no wheezes.    Heart:  normal rate (high end of normal), regular rhythm, no murmur, and no rub. BLE without edema.  Abdomen:  Soft, nontender and nondistended. No masses, hepatosplenomegaly or hernias noted. Normal bowel sounds.Bruising of the anterior abdominal wall area with some induration from his insulin shots. Msk:  No deformity or scoliosis noted of thoracic or lumbar spine.   Pulses:  dorsalis pedis intact bilat.  Extremities:  No clubbing, cyanosis, edema or deformities noted.trace pedal edema.   Neurologic:  Alert and  oriented x4;  grossly normal neurologically.    ICD Specifications Following MD:  Lewayne Bunting, MD     ICD Vendor:  [employer]     ICD Model Number:  [id number]     ICD Serial Number:  [id number] ICD DOI:  08/16/2010     ICD Implanting MD:  Lewayne Bunting, MD  Lead 1:    Location: RA     DOI: 08/16/2010     Model #: [id number]     Serial  #: [id number]     Status: active Lead 2:    Location: RV     DOI: 08/16/2010     Model #: [id number]     Serial #: [id number]     Status: active Lead 3:    Location: LV     DOI: 08/16/2010     Model #: [id number]     Serial #: [id number]     Status: active  Indications::  ICM   ICD Follow Up ICD Dependent:  No      Episodes Coumadin:  No  Brady Parameters Mode DDD     Lower Rate Limit:  50     Upper Rate Limit 130 PAV 200  Sensed AV Delay:  160  Tachy Zones VF:  200     VT:  176     MD Comments:  Normal device function.  Impression & Recommendations:  Problem # 1:  AUTOMATIC IMPLANTABLE CARDIAC DEFIBRILLATOR SITU (ICD-V45.02) His device is working normally. Will recheck in several months.  Problem # 2:  CHRONIC SYSTOLIC HEART FAILURE (ICD-428.22) His symptoms are class 2-3 with mostly low output, particularly after dialysis. I have encourage him to increase his activity, particularly on his non-dialysis days. His updated medication list for this problem includes:    Adult Aspirin Low Strength 81 Mg Tbdp (Aspirin) .Marland Kitchen... Take 1 by mouth qd    Imdur 60 Mg Xr24h-tab (Isosorbide mononitrate) .Marland Kitchen... Take 1 by mouth qd    Nitroglycerin 0.4 Mg/hr Pt24 (Nitroglycerin) ..... Use as needed    Effient 10 Mg Tabs (Prasugrel hcl) .Marland KitchenMarland KitchenMarland KitchenMarland Kitchen 30 mg x 2 doses then 1 once daily    Ranexa 500 Mg Xr12h-tab (Ranolazine) .Marland Kitchen... Tale 1 by mouth once daily    Toprol Xl 50 Mg Xr24h-tab (Metoprolol succinate) .Marland Kitchen... Take 1/2 tablet daily x 2 weeks, then increase to 1 tablet daily  Problem # 3:  HYPERTENSION (ICD-401.9) His blood pressure is now well controlled. He may require a lower dose of beta blocker. His updated medication list for this problem includes:    Adult Aspirin Low Strength 81 Mg Tbdp (Aspirin) .Marland Kitchen... Take 1 by mouth qd    Toprol Xl 50 Mg Xr24h-tab (Metoprolol succinate) .Marland Kitchen... Take 1/2 tablet daily x 2 weeks, then increase to 1 tablet daily  Patient Instructions: 1)  Your physician recommends that you  schedule a follow-up appointment in: 6 months 2)  Your physician recommends that you continue on your current medications as directed. Please refer to the Current Medication list given to you today.

## 2011-03-09 LAB — HEMOGLOBIN AND HEMATOCRIT, BLOOD
HCT: 26.6 % — ABNORMAL LOW (ref 39.0–52.0)
Hemoglobin: 7.3 g/dL — ABNORMAL LOW (ref 13.0–17.0)
Hemoglobin: 8.9 g/dL — ABNORMAL LOW (ref 13.0–17.0)

## 2011-03-09 LAB — APTT
aPTT: 30 seconds (ref 24–37)
aPTT: 33 seconds (ref 24–37)

## 2011-03-09 LAB — CBC
HCT: 26.3 % — ABNORMAL LOW (ref 39.0–52.0)
HCT: 29.3 % — ABNORMAL LOW (ref 39.0–52.0)
Hemoglobin: 7.4 g/dL — ABNORMAL LOW (ref 13.0–17.0)
Hemoglobin: 8.4 g/dL — ABNORMAL LOW (ref 13.0–17.0)
Hemoglobin: 9.3 g/dL — ABNORMAL LOW (ref 13.0–17.0)
MCH: 33.7 pg (ref 26.0–34.0)
MCHC: 33.6 g/dL (ref 30.0–36.0)
MCV: 104.6 fL — ABNORMAL HIGH (ref 78.0–100.0)
MCV: 105.6 fL — ABNORMAL HIGH (ref 78.0–100.0)
Platelets: 123 10*3/uL — ABNORMAL LOW (ref 150–400)
RBC: 2.19 MIL/uL — ABNORMAL LOW (ref 4.22–5.81)
RBC: 2.49 MIL/uL — ABNORMAL LOW (ref 4.22–5.81)
RBC: 2.8 MIL/uL — ABNORMAL LOW (ref 4.22–5.81)
RDW: 16.6 % — ABNORMAL HIGH (ref 11.5–15.5)
WBC: 5.5 10*3/uL (ref 4.0–10.5)
WBC: 5.8 10*3/uL (ref 4.0–10.5)

## 2011-03-09 LAB — GLUCOSE, CAPILLARY
Glucose-Capillary: 140 mg/dL — ABNORMAL HIGH (ref 70–99)
Glucose-Capillary: 148 mg/dL — ABNORMAL HIGH (ref 70–99)
Glucose-Capillary: 149 mg/dL — ABNORMAL HIGH (ref 70–99)
Glucose-Capillary: 154 mg/dL — ABNORMAL HIGH (ref 70–99)

## 2011-03-09 LAB — RENAL FUNCTION PANEL
Albumin: 2.9 g/dL — ABNORMAL LOW (ref 3.5–5.2)
Albumin: 3.2 g/dL — ABNORMAL LOW (ref 3.5–5.2)
CO2: 28 mEq/L (ref 19–32)
CO2: 28 mEq/L (ref 19–32)
Calcium: 8.3 mg/dL — ABNORMAL LOW (ref 8.4–10.5)
Calcium: 8.7 mg/dL (ref 8.4–10.5)
Chloride: 96 mEq/L (ref 96–112)
Chloride: 98 mEq/L (ref 96–112)
Creatinine, Ser: 4.09 mg/dL — ABNORMAL HIGH (ref 0.4–1.5)
GFR calc Af Amer: 14 mL/min — ABNORMAL LOW (ref >60)
GFR calc Af Amer: 17 mL/min — ABNORMAL LOW (ref >60)
GFR calc Af Amer: 9 mL/min — ABNORMAL LOW (ref >60)
GFR calc non Af Amer: 11 mL/min — ABNORMAL LOW (ref >60)
GFR calc non Af Amer: 14 mL/min — ABNORMAL LOW (ref >60)
GFR calc non Af Amer: 8 mL/min — ABNORMAL LOW (ref >60)
Potassium: 4 mEq/L (ref 3.5–5.1)
Sodium: 135 mEq/L (ref 135–145)
Sodium: 139 mEq/L (ref 135–145)

## 2011-03-09 LAB — POTASSIUM: Potassium: 6.4 mEq/L (ref 3.5–5.1)

## 2011-03-09 LAB — COMPREHENSIVE METABOLIC PANEL
ALT: 19 U/L (ref 0–53)
AST: 26 U/L (ref 0–37)
Calcium: 9.4 mg/dL (ref 8.4–10.5)
GFR calc Af Amer: 8 mL/min — ABNORMAL LOW (ref >60)
Glucose, Bld: 202 mg/dL — ABNORMAL HIGH (ref 70–99)
Sodium: 137 mEq/L (ref 135–145)
Total Protein: 7 g/dL (ref 6.0–8.3)

## 2011-03-09 LAB — PROTIME-INR: INR: 1.08 (ref 0.00–1.49)

## 2011-03-09 LAB — RETICULOCYTES: Retic Ct Pct: 3.1 % (ref 0.4–3.1)

## 2011-03-09 LAB — BASIC METABOLIC PANEL
CO2: 29 mEq/L (ref 19–32)
Chloride: 99 mEq/L (ref 96–112)
Creatinine, Ser: 7.25 mg/dL — ABNORMAL HIGH (ref 0.4–1.5)
GFR calc Af Amer: 9 mL/min — ABNORMAL LOW (ref >60)
Potassium: 5.2 mEq/L — ABNORMAL HIGH (ref 3.5–5.1)
Sodium: 136 mEq/L (ref 135–145)

## 2011-03-09 LAB — IRON AND TIBC
Iron: 38 ug/dL — ABNORMAL LOW (ref 42–135)
UIBC: 195 ug/dL

## 2011-03-09 LAB — DIFFERENTIAL
Eosinophils Absolute: 0 10*3/uL (ref 0.0–0.7)
Eosinophils Relative: 0 % (ref 0–5)
Lymphocytes Relative: 17 % (ref 12–46)
Lymphs Abs: 0.9 10*3/uL (ref 0.7–4.0)
Monocytes Absolute: 0.4 10*3/uL (ref 0.1–1.0)
Monocytes Relative: 8 % (ref 3–12)

## 2011-03-09 LAB — BRAIN NATRIURETIC PEPTIDE: Pro B Natriuretic peptide (BNP): 266 pg/mL — ABNORMAL HIGH (ref 0.0–100.0)

## 2011-03-09 LAB — FERRITIN: Ferritin: 379 ng/mL — ABNORMAL HIGH (ref 22–322)

## 2011-03-09 LAB — MRSA PCR SCREENING: MRSA by PCR: NEGATIVE

## 2011-03-09 LAB — POCT CARDIAC MARKERS: Troponin i, poc: <0.05 ng/mL (ref 0.00–0.09)

## 2011-03-09 LAB — MAGNESIUM: Magnesium: 2.7 mg/dL — ABNORMAL HIGH (ref 1.5–2.5)

## 2011-03-12 LAB — GLUCOSE, CAPILLARY
Glucose-Capillary: 140 mg/dL — ABNORMAL HIGH (ref 70–99)
Glucose-Capillary: 176 mg/dL — ABNORMAL HIGH (ref 70–99)
Glucose-Capillary: 183 mg/dL — ABNORMAL HIGH (ref 70–99)
Glucose-Capillary: 209 mg/dL — ABNORMAL HIGH (ref 70–99)
Glucose-Capillary: 223 mg/dL — ABNORMAL HIGH (ref 70–99)
Glucose-Capillary: 230 mg/dL — ABNORMAL HIGH (ref 70–99)

## 2011-03-12 LAB — DIFFERENTIAL
Basophils Absolute: 0 10*3/uL (ref 0.0–0.1)
Basophils Absolute: 0.1 10*3/uL (ref 0.0–0.1)
Basophils Relative: 1 % (ref 0–1)
Basophils Relative: 1 % (ref 0–1)
Eosinophils Relative: 1 % (ref 0–5)
Eosinophils Relative: 1 % (ref 0–5)
Monocytes Absolute: 0.6 10*3/uL (ref 0.1–1.0)
Monocytes Absolute: 0.6 10*3/uL (ref 0.1–1.0)
Monocytes Relative: 6 % (ref 3–12)
Neutro Abs: 8.2 10*3/uL — ABNORMAL HIGH (ref 1.7–7.7)

## 2011-03-12 LAB — CBC
Hemoglobin: 11.8 g/dL — ABNORMAL LOW (ref 13.0–17.0)
MCHC: 35 g/dL (ref 30.0–36.0)
MCV: 90.8 fL (ref 78.0–100.0)
MCV: 90.9 fL (ref 78.0–100.0)
Platelets: 111 10*3/uL — ABNORMAL LOW (ref 150–400)
Platelets: 132 10*3/uL — ABNORMAL LOW (ref 150–400)
Platelets: 150 10*3/uL (ref 150–400)
RBC: 3.83 MIL/uL — ABNORMAL LOW (ref 4.22–5.81)
RBC: 4.09 MIL/uL — ABNORMAL LOW (ref 4.22–5.81)
RDW: 17.2 % — ABNORMAL HIGH (ref 11.5–15.5)
RDW: 17.6 % — ABNORMAL HIGH (ref 11.5–15.5)
WBC: 5.9 10*3/uL (ref 4.0–10.5)
WBC: 7.7 10*3/uL (ref 4.0–10.5)
WBC: 8 10*3/uL (ref 4.0–10.5)

## 2011-03-12 LAB — TROPONIN I
Troponin I: 0.06 ng/mL (ref 0.00–0.06)
Troponin I: 0.1 ng/mL — ABNORMAL HIGH (ref 0.00–0.06)

## 2011-03-12 LAB — CK TOTAL AND CKMB (NOT AT ARMC)
CK, MB: 3.5 ng/mL (ref 0.3–4.0)
Relative Index: INVALID (ref 0.0–2.5)
Total CK: 46 U/L (ref 7–232)
Total CK: 46 U/L (ref 7–232)

## 2011-03-12 LAB — RENAL FUNCTION PANEL
CO2: 24 mEq/L (ref 19–32)
Chloride: 94 mEq/L — ABNORMAL LOW (ref 96–112)
Creatinine, Ser: 4.94 mg/dL — ABNORMAL HIGH (ref 0.4–1.5)
GFR calc Af Amer: 14 mL/min — ABNORMAL LOW (ref >60)
GFR calc non Af Amer: 12 mL/min — ABNORMAL LOW (ref >60)
Potassium: 4.7 mEq/L (ref 3.5–5.1)
Sodium: 131 mEq/L — ABNORMAL LOW (ref 135–145)

## 2011-03-12 LAB — BASIC METABOLIC PANEL
BUN: 39 mg/dL — ABNORMAL HIGH (ref 6–23)
BUN: 69 mg/dL — ABNORMAL HIGH (ref 6–23)
CO2: 24 mEq/L (ref 19–32)
Calcium: 9.5 mg/dL (ref 8.4–10.5)
Calcium: 9.5 mg/dL (ref 8.4–10.5)
Chloride: 93 mEq/L — ABNORMAL LOW (ref 96–112)
Creatinine, Ser: 4.12 mg/dL — ABNORMAL HIGH (ref 0.4–1.5)
Creatinine, Ser: 5.04 mg/dL — ABNORMAL HIGH (ref 0.4–1.5)
GFR calc Af Amer: 14 mL/min — ABNORMAL LOW (ref >60)
GFR calc non Af Amer: 14 mL/min — ABNORMAL LOW (ref >60)
Glucose, Bld: 188 mg/dL — ABNORMAL HIGH (ref 70–99)

## 2011-03-12 LAB — POCT I-STAT 3, VENOUS BLOOD GAS (G3P V)
Acid-base deficit: 3 mmol/L — ABNORMAL HIGH (ref 0.0–2.0)
Bicarbonate: 20.2 mEq/L (ref 20.0–24.0)
O2 Saturation: 62 %
pO2, Ven: 30 mmHg (ref 30.0–45.0)

## 2011-03-12 LAB — APTT: aPTT: 79 seconds — ABNORMAL HIGH (ref 24–37)

## 2011-03-12 LAB — POCT CARDIAC MARKERS
CKMB, poc: 1.7 ng/mL (ref 1.0–8.0)
CKMB, poc: 2.1 ng/mL (ref 1.0–8.0)
Troponin i, poc: 0.07 ng/mL (ref 0.00–0.09)
Troponin i, poc: <0.05 ng/mL (ref 0.00–0.09)

## 2011-03-12 LAB — PROTIME-INR: Prothrombin Time: 14.6 seconds (ref 11.6–15.2)

## 2011-03-13 LAB — HEMOGLOBIN A1C
Hgb A1c MFr Bld: 7.5 % — ABNORMAL HIGH (ref <5.7)
Mean Plasma Glucose: 169 mg/dL — ABNORMAL HIGH (ref <117)

## 2011-03-13 LAB — RENAL FUNCTION PANEL
BUN: 22 mg/dL (ref 6–23)
CO2: 26 mEq/L (ref 19–32)
CO2: 26 mEq/L (ref 19–32)
Calcium: 9.1 mg/dL (ref 8.4–10.5)
Calcium: 9.3 mg/dL (ref 8.4–10.5)
Creatinine, Ser: 5.7 mg/dL — ABNORMAL HIGH (ref 0.4–1.5)
GFR calc Af Amer: 12 mL/min — ABNORMAL LOW (ref >60)
GFR calc Af Amer: 9 mL/min — ABNORMAL LOW (ref >60)
GFR calc non Af Amer: 10 mL/min — ABNORMAL LOW (ref >60)
GFR calc non Af Amer: 7 mL/min — ABNORMAL LOW (ref >60)
Glucose, Bld: 178 mg/dL — ABNORMAL HIGH (ref 70–99)
Potassium: 4.9 mEq/L (ref 3.5–5.1)
Sodium: 137 mEq/L (ref 135–145)

## 2011-03-13 LAB — LIPID PANEL: HDL: 27 mg/dL — ABNORMAL LOW (ref >39)

## 2011-03-13 LAB — CBC
HCT: 32.2 % — ABNORMAL LOW (ref 39.0–52.0)
HCT: 33.2 % — ABNORMAL LOW (ref 39.0–52.0)
Hemoglobin: 10.9 g/dL — ABNORMAL LOW (ref 13.0–17.0)
Hemoglobin: 11.8 g/dL — ABNORMAL LOW (ref 13.0–17.0)
MCHC: 34.5 g/dL (ref 30.0–36.0)
MCHC: 35.1 g/dL (ref 30.0–36.0)
MCV: 98.9 fL (ref 78.0–100.0)
Platelets: 117 10*3/uL — ABNORMAL LOW (ref 150–400)
Platelets: 117 10*3/uL — ABNORMAL LOW (ref 150–400)
Platelets: 137 10*3/uL — ABNORMAL LOW (ref 150–400)
RDW: 15.8 % — ABNORMAL HIGH (ref 11.5–15.5)
RDW: 15.8 % — ABNORMAL HIGH (ref 11.5–15.5)
WBC: 6.8 10*3/uL (ref 4.0–10.5)

## 2011-03-13 LAB — DIFFERENTIAL
Lymphocytes Relative: 21 % (ref 12–46)
Lymphs Abs: 1.4 10*3/uL (ref 0.7–4.0)
Monocytes Relative: 8 % (ref 3–12)
Neutrophils Relative %: 70 % (ref 43–77)

## 2011-03-13 LAB — IRON AND TIBC
Iron: 107 ug/dL (ref 42–135)
Saturation Ratios: 52 % (ref 20–55)
UIBC: 99 ug/dL

## 2011-03-13 LAB — CARDIAC PANEL(CRET KIN+CKTOT+MB+TROPI)
CK, MB: 3.7 ng/mL (ref 0.3–4.0)
Relative Index: INVALID (ref 0.0–2.5)
Total CK: 82 U/L (ref 7–232)
Total CK: 92 U/L (ref 7–232)
Troponin I: 0.22 ng/mL — ABNORMAL HIGH (ref 0.00–0.06)
Troponin I: 0.91 ng/mL (ref 0.00–0.06)
Troponin I: 1.17 ng/mL (ref 0.00–0.06)

## 2011-03-13 LAB — COMPREHENSIVE METABOLIC PANEL
ALT: 30 U/L (ref 0–53)
Albumin: 3.2 g/dL — ABNORMAL LOW (ref 3.5–5.2)
Alkaline Phosphatase: 27 U/L — ABNORMAL LOW (ref 39–117)
BUN: 31 mg/dL — ABNORMAL HIGH (ref 6–23)
Chloride: 97 mEq/L (ref 96–112)
Potassium: 6.5 mEq/L (ref 3.5–5.1)
Sodium: 134 mEq/L — ABNORMAL LOW (ref 135–145)
Total Bilirubin: 1 mg/dL (ref 0.3–1.2)
Total Protein: 6.8 g/dL (ref 6.0–8.3)

## 2011-03-13 LAB — BASIC METABOLIC PANEL
Calcium: 9.1 mg/dL (ref 8.4–10.5)
GFR calc Af Amer: 9 mL/min — ABNORMAL LOW (ref >60)
GFR calc non Af Amer: 7 mL/min — ABNORMAL LOW (ref >60)
Potassium: 4.8 mEq/L (ref 3.5–5.1)
Sodium: 138 mEq/L (ref 135–145)

## 2011-03-13 LAB — RETICULOCYTES: Retic Count, Absolute: 51.7 10*3/uL (ref 19.0–186.0)

## 2011-03-13 LAB — POTASSIUM: Potassium: 6 mEq/L — ABNORMAL HIGH (ref 3.5–5.1)

## 2011-03-13 LAB — HEPARIN LEVEL (UNFRACTIONATED): Heparin Unfractionated: 0.13 IU/mL — ABNORMAL LOW (ref 0.30–0.70)

## 2011-03-13 LAB — PTH, INTACT AND CALCIUM: PTH: 133.7 pg/mL — ABNORMAL HIGH (ref 14.0–72.0)

## 2011-03-13 LAB — MAGNESIUM: Magnesium: 2.4 mg/dL (ref 1.5–2.5)

## 2011-03-13 LAB — GLUCOSE, CAPILLARY
Glucose-Capillary: 112 mg/dL — ABNORMAL HIGH (ref 70–99)
Glucose-Capillary: 132 mg/dL — ABNORMAL HIGH (ref 70–99)
Glucose-Capillary: 250 mg/dL — ABNORMAL HIGH (ref 70–99)

## 2011-03-13 LAB — VITAMIN B12: Vitamin B-12: 609 pg/mL (ref 211–911)

## 2011-03-13 LAB — TSH: TSH: 1.688 u[IU]/mL (ref 0.350–4.500)

## 2011-03-13 LAB — HEPATITIS B SURFACE ANTIGEN: Hepatitis B Surface Ag: NEGATIVE

## 2011-03-18 ENCOUNTER — Other Ambulatory Visit (INDEPENDENT_AMBULATORY_CARE_PROVIDER_SITE_OTHER): Payer: Medicare Other | Admitting: Endocrinology

## 2011-03-18 DIAGNOSIS — IMO0002 Reserved for concepts with insufficient information to code with codable children: Secondary | ICD-10-CM

## 2011-03-18 DIAGNOSIS — E1065 Type 1 diabetes mellitus with hyperglycemia: Secondary | ICD-10-CM

## 2011-03-27 ENCOUNTER — Other Ambulatory Visit: Payer: Self-pay | Admitting: Internal Medicine

## 2011-03-30 LAB — GLUCOSE, CAPILLARY
Glucose-Capillary: 109 mg/dL — ABNORMAL HIGH (ref 70–99)
Glucose-Capillary: 132 mg/dL — ABNORMAL HIGH (ref 70–99)
Glucose-Capillary: 144 mg/dL — ABNORMAL HIGH (ref 70–99)
Glucose-Capillary: 178 mg/dL — ABNORMAL HIGH (ref 70–99)
Glucose-Capillary: 199 mg/dL — ABNORMAL HIGH (ref 70–99)
Glucose-Capillary: 207 mg/dL — ABNORMAL HIGH (ref 70–99)
Glucose-Capillary: 217 mg/dL — ABNORMAL HIGH (ref 70–99)
Glucose-Capillary: 221 mg/dL — ABNORMAL HIGH (ref 70–99)
Glucose-Capillary: 233 mg/dL — ABNORMAL HIGH (ref 70–99)
Glucose-Capillary: 268 mg/dL — ABNORMAL HIGH (ref 70–99)
Glucose-Capillary: 334 mg/dL — ABNORMAL HIGH (ref 70–99)
Glucose-Capillary: 343 mg/dL — ABNORMAL HIGH (ref 70–99)
Glucose-Capillary: 123 mg/dL — ABNORMAL HIGH (ref 70–99)
Glucose-Capillary: 132 mg/dL — ABNORMAL HIGH (ref 70–99)
Glucose-Capillary: 157 mg/dL — ABNORMAL HIGH (ref 70–99)
Glucose-Capillary: 163 mg/dL — ABNORMAL HIGH (ref 70–99)
Glucose-Capillary: 182 mg/dL — ABNORMAL HIGH (ref 70–99)
Glucose-Capillary: 183 mg/dL — ABNORMAL HIGH (ref 70–99)
Glucose-Capillary: 194 mg/dL — ABNORMAL HIGH (ref 70–99)
Glucose-Capillary: 205 mg/dL — ABNORMAL HIGH (ref 70–99)
Glucose-Capillary: 205 mg/dL — ABNORMAL HIGH (ref 70–99)
Glucose-Capillary: 206 mg/dL — ABNORMAL HIGH (ref 70–99)
Glucose-Capillary: 212 mg/dL — ABNORMAL HIGH (ref 70–99)
Glucose-Capillary: 220 mg/dL — ABNORMAL HIGH (ref 70–99)
Glucose-Capillary: 260 mg/dL — ABNORMAL HIGH (ref 70–99)
Glucose-Capillary: 267 mg/dL — ABNORMAL HIGH (ref 70–99)
Glucose-Capillary: 282 mg/dL — ABNORMAL HIGH (ref 70–99)
Glucose-Capillary: 289 mg/dL — ABNORMAL HIGH (ref 70–99)
Glucose-Capillary: 78 mg/dL (ref 70–99)

## 2011-03-30 LAB — URINALYSIS, ROUTINE W REFLEX MICROSCOPIC
Bilirubin Urine: NEGATIVE
Glucose, UA: NEGATIVE mg/dL
Ketones, ur: NEGATIVE mg/dL
Protein, ur: 100 mg/dL — AB
pH: 5 (ref 5.0–8.0)

## 2011-03-30 LAB — CBC
HCT: 23.6 % — ABNORMAL LOW (ref 39.0–52.0)
HCT: 24.5 % — ABNORMAL LOW (ref 39.0–52.0)
HCT: 24.7 % — ABNORMAL LOW (ref 39.0–52.0)
HCT: 28.1 % — ABNORMAL LOW (ref 39.0–52.0)
HCT: 22.4 % — ABNORMAL LOW (ref 39.0–52.0)
HCT: 24.1 % — ABNORMAL LOW (ref 39.0–52.0)
HCT: 25.2 % — ABNORMAL LOW (ref 39.0–52.0)
HCT: 25.9 % — ABNORMAL LOW (ref 39.0–52.0)
HCT: 26.2 % — ABNORMAL LOW (ref 39.0–52.0)
HCT: 27 % — ABNORMAL LOW (ref 39.0–52.0)
Hemoglobin: 8.3 g/dL — ABNORMAL LOW (ref 13.0–17.0)
Hemoglobin: 8.5 g/dL — ABNORMAL LOW (ref 13.0–17.0)
Hemoglobin: 7.5 g/dL — CL (ref 13.0–17.0)
Hemoglobin: 8.3 g/dL — ABNORMAL LOW (ref 13.0–17.0)
Hemoglobin: 8.5 g/dL — ABNORMAL LOW (ref 13.0–17.0)
Hemoglobin: 8.9 g/dL — ABNORMAL LOW (ref 13.0–17.0)
Hemoglobin: 9.1 g/dL — ABNORMAL LOW (ref 13.0–17.0)
Hemoglobin: 9.4 g/dL — ABNORMAL LOW (ref 13.0–17.0)
MCHC: 33.9 g/dL (ref 30.0–36.0)
MCHC: 34.2 g/dL (ref 30.0–36.0)
MCHC: 34.3 g/dL (ref 30.0–36.0)
MCHC: 34.5 g/dL (ref 30.0–36.0)
MCHC: 34.5 g/dL (ref 30.0–36.0)
MCHC: 34.8 g/dL (ref 30.0–36.0)
MCHC: 34.9 g/dL (ref 30.0–36.0)
MCHC: 35.1 g/dL (ref 30.0–36.0)
MCV: 92.6 fL (ref 78.0–100.0)
MCV: 92.6 fL (ref 78.0–100.0)
MCV: 92.8 fL (ref 78.0–100.0)
MCV: 91.8 fL (ref 78.0–100.0)
MCV: 91.9 fL (ref 78.0–100.0)
MCV: 91.9 fL (ref 78.0–100.0)
MCV: 92 fL (ref 78.0–100.0)
MCV: 92.2 fL (ref 78.0–100.0)
MCV: 92.4 fL (ref 78.0–100.0)
MCV: 92.7 fL (ref 78.0–100.0)
Platelets: 116 10*3/uL — ABNORMAL LOW (ref 150–400)
Platelets: 127 10*3/uL — ABNORMAL LOW (ref 150–400)
Platelets: 143 10*3/uL — ABNORMAL LOW (ref 150–400)
Platelets: 165 10*3/uL (ref 150–400)
Platelets: 169 10*3/uL (ref 150–400)
Platelets: 182 10*3/uL (ref 150–400)
Platelets: 186 10*3/uL (ref 150–400)
Platelets: 188 10*3/uL (ref 150–400)
Platelets: 205 10*3/uL (ref 150–400)
Platelets: 245 10*3/uL (ref 150–400)
RBC: 2.59 MIL/uL — ABNORMAL LOW (ref 4.22–5.81)
RBC: 2.65 MIL/uL — ABNORMAL LOW (ref 4.22–5.81)
RBC: 2.3 MIL/uL — ABNORMAL LOW (ref 4.22–5.81)
RBC: 2.67 MIL/uL — ABNORMAL LOW (ref 4.22–5.81)
RBC: 2.75 MIL/uL — ABNORMAL LOW (ref 4.22–5.81)
RBC: 2.82 MIL/uL — ABNORMAL LOW (ref 4.22–5.81)
RBC: 3.11 MIL/uL — ABNORMAL LOW (ref 4.22–5.81)
RDW: 16.1 % — ABNORMAL HIGH (ref 11.5–15.5)
RDW: 16.4 % — ABNORMAL HIGH (ref 11.5–15.5)
RDW: 16.5 % — ABNORMAL HIGH (ref 11.5–15.5)
RDW: 16.5 % — ABNORMAL HIGH (ref 11.5–15.5)
RDW: 17.2 % — ABNORMAL HIGH (ref 11.5–15.5)
RDW: 17.3 % — ABNORMAL HIGH (ref 11.5–15.5)
RDW: 16.3 % — ABNORMAL HIGH (ref 11.5–15.5)
RDW: 16.3 % — ABNORMAL HIGH (ref 11.5–15.5)
RDW: 16.7 % — ABNORMAL HIGH (ref 11.5–15.5)
RDW: 16.8 % — ABNORMAL HIGH (ref 11.5–15.5)
WBC: 5.3 10*3/uL (ref 4.0–10.5)
WBC: 5.4 10*3/uL (ref 4.0–10.5)
WBC: 5.9 10*3/uL (ref 4.0–10.5)
WBC: 7.4 10*3/uL (ref 4.0–10.5)
WBC: 11.2 10*3/uL — ABNORMAL HIGH (ref 4.0–10.5)
WBC: 6.9 10*3/uL (ref 4.0–10.5)
WBC: 8.2 10*3/uL (ref 4.0–10.5)
WBC: 8.4 10*3/uL (ref 4.0–10.5)

## 2011-03-30 LAB — CROSSMATCH
ABO/RH(D): A POS
Antibody Screen: NEGATIVE

## 2011-03-30 LAB — IRON AND TIBC
Iron: 35 ug/dL — ABNORMAL LOW (ref 42–135)
TIBC: 189 ug/dL — ABNORMAL LOW (ref 215–435)
UIBC: 154 ug/dL

## 2011-03-30 LAB — BASIC METABOLIC PANEL
BUN: 50 mg/dL — ABNORMAL HIGH (ref 6–23)
BUN: 93 mg/dL — ABNORMAL HIGH (ref 6–23)
BUN: 112 mg/dL — ABNORMAL HIGH (ref 6–23)
BUN: 80 mg/dL — ABNORMAL HIGH (ref 6–23)
CO2: 21 mEq/L (ref 19–32)
CO2: 21 mEq/L (ref 19–32)
CO2: 22 mEq/L (ref 19–32)
CO2: 24 mEq/L (ref 19–32)
Calcium: 8.1 mg/dL — ABNORMAL LOW (ref 8.4–10.5)
Calcium: 8.2 mg/dL — ABNORMAL LOW (ref 8.4–10.5)
Chloride: 97 mEq/L (ref 96–112)
Creatinine, Ser: 4.98 mg/dL — ABNORMAL HIGH (ref 0.4–1.5)
Creatinine, Ser: 6.85 mg/dL — ABNORMAL HIGH (ref 0.4–1.5)
Creatinine, Ser: 5.53 mg/dL — ABNORMAL HIGH (ref 0.4–1.5)
GFR calc Af Amer: 12 mL/min — ABNORMAL LOW (ref >60)
GFR calc non Af Amer: 11 mL/min — ABNORMAL LOW (ref >60)
GFR calc non Af Amer: 8 mL/min — ABNORMAL LOW (ref >60)
GFR calc non Af Amer: 11 mL/min — ABNORMAL LOW (ref >60)
GFR calc non Af Amer: 14 mL/min — ABNORMAL LOW (ref >60)
Glucose, Bld: 201 mg/dL — ABNORMAL HIGH (ref 70–99)
Glucose, Bld: 288 mg/dL — ABNORMAL HIGH (ref 70–99)
Glucose, Bld: 211 mg/dL — ABNORMAL HIGH (ref 70–99)
Glucose, Bld: 256 mg/dL — ABNORMAL HIGH (ref 70–99)
Potassium: 3.4 mEq/L — ABNORMAL LOW (ref 3.5–5.1)
Potassium: 3.5 mEq/L (ref 3.5–5.1)
Potassium: 3.6 mEq/L (ref 3.5–5.1)
Potassium: 3.7 mEq/L (ref 3.5–5.1)
Sodium: 130 mEq/L — ABNORMAL LOW (ref 135–145)
Sodium: 131 mEq/L — ABNORMAL LOW (ref 135–145)
Sodium: 132 mEq/L — ABNORMAL LOW (ref 135–145)

## 2011-03-30 LAB — DIFFERENTIAL
Basophils Absolute: 0 10*3/uL (ref 0.0–0.1)
Basophils Absolute: 0 10*3/uL (ref 0.0–0.1)
Basophils Absolute: 0.1 10*3/uL (ref 0.0–0.1)
Basophils Relative: 0 % (ref 0–1)
Basophils Relative: 1 % (ref 0–1)
Eosinophils Absolute: 0.1 10*3/uL (ref 0.0–0.7)
Eosinophils Absolute: 0 10*3/uL (ref 0.0–0.7)
Eosinophils Absolute: 0.1 10*3/uL (ref 0.0–0.7)
Eosinophils Relative: 2 % (ref 0–5)
Eosinophils Relative: 2 % (ref 0–5)
Eosinophils Relative: 2 % (ref 0–5)
Lymphocytes Relative: 10 % — ABNORMAL LOW (ref 12–46)
Lymphocytes Relative: 10 % — ABNORMAL LOW (ref 12–46)
Lymphs Abs: 0.4 10*3/uL — ABNORMAL LOW (ref 0.7–4.0)
Monocytes Absolute: 0.4 10*3/uL (ref 0.1–1.0)
Monocytes Absolute: 0.9 10*3/uL (ref 0.1–1.0)
Monocytes Relative: 4 % (ref 3–12)
Neutro Abs: 3.9 10*3/uL (ref 1.7–7.7)
Neutrophils Relative %: 74 % (ref 43–77)
Neutrophils Relative %: 93 % — ABNORMAL HIGH (ref 43–77)

## 2011-03-30 LAB — PROTIME-INR
INR: 1.33 (ref 0.00–1.49)
Prothrombin Time: 16.4 seconds — ABNORMAL HIGH (ref 11.6–15.2)
Prothrombin Time: 14.5 seconds (ref 11.6–15.2)

## 2011-03-30 LAB — RENAL FUNCTION PANEL
Albumin: 2.8 g/dL — ABNORMAL LOW (ref 3.5–5.2)
Albumin: 3.1 g/dL — ABNORMAL LOW (ref 3.5–5.2)
Albumin: 3.2 g/dL — ABNORMAL LOW (ref 3.5–5.2)
BUN: 124 mg/dL — ABNORMAL HIGH (ref 6–23)
CO2: 26 mEq/L (ref 19–32)
CO2: 24 mEq/L (ref 19–32)
Calcium: 8.4 mg/dL (ref 8.4–10.5)
Calcium: 8.5 mg/dL (ref 8.4–10.5)
Chloride: 100 mEq/L (ref 96–112)
Creatinine, Ser: 4.7 mg/dL — ABNORMAL HIGH (ref 0.4–1.5)
Creatinine, Ser: 5.41 mg/dL — ABNORMAL HIGH (ref 0.4–1.5)
GFR calc Af Amer: 12 mL/min — ABNORMAL LOW (ref >60)
GFR calc Af Amer: 15 mL/min — ABNORMAL LOW (ref >60)
GFR calc Af Amer: 14 mL/min — ABNORMAL LOW (ref >60)
GFR calc non Af Amer: 10 mL/min — ABNORMAL LOW (ref >60)
GFR calc non Af Amer: 12 mL/min — ABNORMAL LOW (ref >60)
GFR calc non Af Amer: 11 mL/min — ABNORMAL LOW (ref >60)
Glucose, Bld: 121 mg/dL — ABNORMAL HIGH (ref 70–99)
Glucose, Bld: 133 mg/dL — ABNORMAL HIGH (ref 70–99)
Glucose, Bld: 174 mg/dL — ABNORMAL HIGH (ref 70–99)
Glucose, Bld: 179 mg/dL — ABNORMAL HIGH (ref 70–99)
Phosphorus: 4.6 mg/dL (ref 2.3–4.6)
Phosphorus: 5.5 mg/dL — ABNORMAL HIGH (ref 2.3–4.6)
Phosphorus: 6.2 mg/dL — ABNORMAL HIGH (ref 2.3–4.6)
Potassium: 3.2 mEq/L — ABNORMAL LOW (ref 3.5–5.1)
Potassium: 3.4 mEq/L — ABNORMAL LOW (ref 3.5–5.1)
Potassium: 3.9 mEq/L (ref 3.5–5.1)
Sodium: 137 mEq/L (ref 135–145)
Sodium: 135 mEq/L (ref 135–145)
Sodium: 136 mEq/L (ref 135–145)

## 2011-03-30 LAB — URINE MICROSCOPIC-ADD ON

## 2011-03-30 LAB — COMPREHENSIVE METABOLIC PANEL
Albumin: 2.8 g/dL — ABNORMAL LOW (ref 3.5–5.2)
BUN: 120 mg/dL — ABNORMAL HIGH (ref 6–23)
BUN: 123 mg/dL — ABNORMAL HIGH (ref 6–23)
CO2: 21 mEq/L (ref 19–32)
Calcium: 8.3 mg/dL — ABNORMAL LOW (ref 8.4–10.5)
Chloride: 101 mEq/L (ref 96–112)
Creatinine, Ser: 5.12 mg/dL — ABNORMAL HIGH (ref 0.4–1.5)
Creatinine, Ser: 5.28 mg/dL — ABNORMAL HIGH (ref 0.4–1.5)
GFR calc non Af Amer: 11 mL/min — ABNORMAL LOW (ref >60)
Potassium: 3.3 mEq/L — ABNORMAL LOW (ref 3.5–5.1)
Total Bilirubin: 1 mg/dL (ref 0.3–1.2)
Total Protein: 6.4 g/dL (ref 6.0–8.3)

## 2011-03-30 LAB — CARDIAC PANEL(CRET KIN+CKTOT+MB+TROPI)
Relative Index: INVALID (ref 0.0–2.5)
Total CK: 78 U/L (ref 7–232)
Troponin I: 0.01 ng/mL (ref 0.00–0.06)
Troponin I: 0.02 ng/mL (ref 0.00–0.06)

## 2011-03-30 LAB — MAGNESIUM: Magnesium: 3.2 mg/dL — ABNORMAL HIGH (ref 1.5–2.5)

## 2011-03-30 LAB — ABO/RH: ABO/RH(D): A POS

## 2011-03-30 LAB — HEPATITIS B SURFACE ANTIGEN: Hepatitis B Surface Ag: NEGATIVE

## 2011-03-30 LAB — VITAMIN B12: Vitamin B-12: >2000 pg/mL — ABNORMAL HIGH (ref 211–911)

## 2011-03-30 LAB — FERRITIN: Ferritin: 182 ng/mL (ref 22–322)

## 2011-03-30 LAB — ALBUMIN: Albumin: 2.5 g/dL — ABNORMAL LOW (ref 3.5–5.2)

## 2011-03-30 LAB — HEMOGLOBIN A1C: Mean Plasma Glucose: 163 mg/dL

## 2011-03-30 LAB — BRAIN NATRIURETIC PEPTIDE: Pro B Natriuretic peptide (BNP): 953 pg/mL — ABNORMAL HIGH (ref 0.0–100.0)

## 2011-03-30 LAB — HEMOCCULT GUIAC POC 1CARD (OFFICE): Fecal Occult Bld: NEGATIVE

## 2011-03-31 LAB — GLUCOSE, CAPILLARY
Glucose-Capillary: 144 mg/dL — ABNORMAL HIGH (ref 70–99)
Glucose-Capillary: 148 mg/dL — ABNORMAL HIGH (ref 70–99)
Glucose-Capillary: 181 mg/dL — ABNORMAL HIGH (ref 70–99)
Glucose-Capillary: 183 mg/dL — ABNORMAL HIGH (ref 70–99)
Glucose-Capillary: 185 mg/dL — ABNORMAL HIGH (ref 70–99)
Glucose-Capillary: 209 mg/dL — ABNORMAL HIGH (ref 70–99)
Glucose-Capillary: 213 mg/dL — ABNORMAL HIGH (ref 70–99)
Glucose-Capillary: 215 mg/dL — ABNORMAL HIGH (ref 70–99)
Glucose-Capillary: 218 mg/dL — ABNORMAL HIGH (ref 70–99)
Glucose-Capillary: 232 mg/dL — ABNORMAL HIGH (ref 70–99)
Glucose-Capillary: 235 mg/dL — ABNORMAL HIGH (ref 70–99)
Glucose-Capillary: 242 mg/dL — ABNORMAL HIGH (ref 70–99)
Glucose-Capillary: 249 mg/dL — ABNORMAL HIGH (ref 70–99)
Glucose-Capillary: 251 mg/dL — ABNORMAL HIGH (ref 70–99)
Glucose-Capillary: 273 mg/dL — ABNORMAL HIGH (ref 70–99)
Glucose-Capillary: 276 mg/dL — ABNORMAL HIGH (ref 70–99)
Glucose-Capillary: 278 mg/dL — ABNORMAL HIGH (ref 70–99)
Glucose-Capillary: 279 mg/dL — ABNORMAL HIGH (ref 70–99)
Glucose-Capillary: 317 mg/dL — ABNORMAL HIGH (ref 70–99)

## 2011-03-31 LAB — CBC
HCT: 29.4 % — ABNORMAL LOW (ref 39.0–52.0)
HCT: 30.7 % — ABNORMAL LOW (ref 39.0–52.0)
HCT: 31.8 % — ABNORMAL LOW (ref 39.0–52.0)
Hemoglobin: 10.3 g/dL — ABNORMAL LOW (ref 13.0–17.0)
Hemoglobin: 10.9 g/dL — ABNORMAL LOW (ref 13.0–17.0)
Hemoglobin: 9.2 g/dL — ABNORMAL LOW (ref 13.0–17.0)
Hemoglobin: 9.6 g/dL — ABNORMAL LOW (ref 13.0–17.0)
MCHC: 33.9 g/dL (ref 30.0–36.0)
MCHC: 34.4 g/dL (ref 30.0–36.0)
MCHC: 34.6 g/dL (ref 30.0–36.0)
MCV: 93.6 fL (ref 78.0–100.0)
Platelets: 170 10*3/uL (ref 150–400)
Platelets: 176 10*3/uL (ref 150–400)
Platelets: 183 10*3/uL (ref 150–400)
Platelets: 196 10*3/uL (ref 150–400)
RBC: 2.91 MIL/uL — ABNORMAL LOW (ref 4.22–5.81)
RBC: 3.14 MIL/uL — ABNORMAL LOW (ref 4.22–5.81)
RDW: 16.2 % — ABNORMAL HIGH (ref 11.5–15.5)
RDW: 16.3 % — ABNORMAL HIGH (ref 11.5–15.5)
RDW: 16.3 % — ABNORMAL HIGH (ref 11.5–15.5)
RDW: 16.5 % — ABNORMAL HIGH (ref 11.5–15.5)
RDW: 16.6 % — ABNORMAL HIGH (ref 11.5–15.5)
RDW: 16.7 % — ABNORMAL HIGH (ref 11.5–15.5)
WBC: 4.7 10*3/uL (ref 4.0–10.5)
WBC: 9.2 10*3/uL (ref 4.0–10.5)

## 2011-03-31 LAB — BASIC METABOLIC PANEL
BUN: 45 mg/dL — ABNORMAL HIGH (ref 6–23)
CO2: 30 mEq/L (ref 19–32)
Calcium: 8.9 mg/dL (ref 8.4–10.5)
Calcium: 9.1 mg/dL (ref 8.4–10.5)
Calcium: 9.1 mg/dL (ref 8.4–10.5)
Calcium: 9.2 mg/dL (ref 8.4–10.5)
Calcium: 9.4 mg/dL (ref 8.4–10.5)
Creatinine, Ser: 3.05 mg/dL — ABNORMAL HIGH (ref 0.4–1.5)
GFR calc Af Amer: 17 mL/min — ABNORMAL LOW (ref >60)
GFR calc Af Amer: 21 mL/min — ABNORMAL LOW (ref >60)
GFR calc Af Amer: 23 mL/min — ABNORMAL LOW (ref >60)
GFR calc non Af Amer: 14 mL/min — ABNORMAL LOW (ref >60)
GFR calc non Af Amer: 17 mL/min — ABNORMAL LOW (ref >60)
GFR calc non Af Amer: 19 mL/min — ABNORMAL LOW (ref >60)
GFR calc non Af Amer: 20 mL/min — ABNORMAL LOW (ref >60)
Glucose, Bld: 179 mg/dL — ABNORMAL HIGH (ref 70–99)
Glucose, Bld: 219 mg/dL — ABNORMAL HIGH (ref 70–99)
Glucose, Bld: 299 mg/dL — ABNORMAL HIGH (ref 70–99)
Glucose, Bld: 399 mg/dL — ABNORMAL HIGH (ref 70–99)
Potassium: 3.7 mEq/L (ref 3.5–5.1)
Potassium: 4.3 mEq/L (ref 3.5–5.1)
Potassium: 4.5 mEq/L (ref 3.5–5.1)
Sodium: 135 mEq/L (ref 135–145)
Sodium: 136 mEq/L (ref 135–145)
Sodium: 136 mEq/L (ref 135–145)
Sodium: 138 mEq/L (ref 135–145)

## 2011-03-31 LAB — CARDIAC PANEL(CRET KIN+CKTOT+MB+TROPI)
CK, MB: 1.3 ng/mL (ref 0.3–4.0)
CK, MB: 1.5 ng/mL (ref 0.3–4.0)
CK, MB: 2.2 ng/mL (ref 0.3–4.0)
Total CK: 230 U/L (ref 7–232)
Total CK: 286 U/L — ABNORMAL HIGH (ref 7–232)
Troponin I: 0.05 ng/mL (ref 0.00–0.06)

## 2011-03-31 LAB — COMPREHENSIVE METABOLIC PANEL
AST: 20 U/L (ref 0–37)
Albumin: 3.3 g/dL — ABNORMAL LOW (ref 3.5–5.2)
Alkaline Phosphatase: 20 U/L — ABNORMAL LOW (ref 39–117)
Chloride: 93 mEq/L — ABNORMAL LOW (ref 96–112)
GFR calc Af Amer: 16 mL/min — ABNORMAL LOW (ref >60)
Potassium: 4.3 mEq/L (ref 3.5–5.1)
Sodium: 132 mEq/L — ABNORMAL LOW (ref 135–145)
Total Bilirubin: 1.4 mg/dL — ABNORMAL HIGH (ref 0.3–1.2)
Total Protein: 6.9 g/dL (ref 6.0–8.3)

## 2011-03-31 LAB — URINE CULTURE
Colony Count: NO GROWTH
Culture: NO GROWTH

## 2011-03-31 LAB — BLOOD GAS, ARTERIAL
Bicarbonate: 25.6 mEq/L — ABNORMAL HIGH (ref 20.0–24.0)
O2 Saturation: 95.8 %
Patient temperature: 98.6

## 2011-03-31 LAB — HEMOGLOBIN A1C: Mean Plasma Glucose: 177 mg/dL

## 2011-03-31 LAB — LIPID PANEL
Cholesterol: 105 mg/dL (ref 0–200)
HDL: 24 mg/dL — ABNORMAL LOW (ref >39)

## 2011-03-31 LAB — RENAL FUNCTION PANEL
BUN: 72 mg/dL — ABNORMAL HIGH (ref 6–23)
CO2: 25 mEq/L (ref 19–32)
CO2: 26 mEq/L (ref 19–32)
Calcium: 8.9 mg/dL (ref 8.4–10.5)
Chloride: 96 mEq/L (ref 96–112)
Creatinine, Ser: 4.12 mg/dL — ABNORMAL HIGH (ref 0.4–1.5)
Glucose, Bld: 189 mg/dL — ABNORMAL HIGH (ref 70–99)
Glucose, Bld: 231 mg/dL — ABNORMAL HIGH (ref 70–99)
Phosphorus: 5.5 mg/dL — ABNORMAL HIGH (ref 2.3–4.6)
Phosphorus: 5.7 mg/dL — ABNORMAL HIGH (ref 2.3–4.6)
Potassium: 4.2 mEq/L (ref 3.5–5.1)
Sodium: 135 mEq/L (ref 135–145)

## 2011-03-31 LAB — CULTURE, BLOOD (ROUTINE X 2)

## 2011-03-31 LAB — VANCOMYCIN, RANDOM: Vancomycin Rm: 26.5 ug/mL

## 2011-04-02 LAB — GLUCOSE, CAPILLARY

## 2011-04-04 ENCOUNTER — Ambulatory Visit: Payer: Medicare Other | Admitting: Vascular Surgery

## 2011-04-05 LAB — POCT I-STAT, CHEM 8
BUN: 47 mg/dL — ABNORMAL HIGH (ref 6–23)
Calcium, Ion: 1.06 mmol/L — ABNORMAL LOW (ref 1.12–1.32)
Chloride: 105 mEq/L (ref 96–112)

## 2011-04-18 ENCOUNTER — Other Ambulatory Visit: Payer: Self-pay | Admitting: Endocrinology

## 2011-05-09 NOTE — Assessment & Plan Note (Signed)
[facility]                            CARDIOLOGY OFFICE NOTE   NAME:ALLENKeiron, [name]                         MRN:          [id number]  DATE:01/17/2008                            DOB:          01-Sep-1933    [name] returns today for follow-up.   He has a recent hospitalization in November for respiratory  insufficiency, this seems to have been improved.  He has chronic renal  failure which is a big issue for him as well as Parkinson's disease.  He  had a CABG in 2003 with mammary to the LAD, vein graft to the diagonal,  vein graft to the PDA.  He has hypertension and hyperlipidemia.  His  last Myoview in October was low risk with inferior basal thinning, no  ischemia, normal EF.   Has not been having any significant chest pain.  His respiratory  problems have cleared up.  There has been no fever, cough or sputum  production.   He is to see Dr. Kathrene Bongo next week.  His lab work was reviewed.  He  continues to run creatinines of 2.5 to 2.7 with a BUN above 60.  I told  [name] that I would cut his Lasix back to 80 in the morning and 40 in the  afternoon, he is currently on 80 b.i.d.  He has otherwise been compliant  with his medications and feels well.  He is not having any significant  chest pain.  Unfortunately, [name] is fairly sedentary.  I explained to  him that both in regards to his heart and his Parkinson's he needs to  walk on a daily basis.  His wife will try to get him more motivated   He has just seen Dr. Thad Ranger and apparently has had his Parkinson's  meds adjusted and his lab work was okay.   REVIEW OF SYSTEMS:  Remarkable for possible need for B12 shots for  macrocytosis.   MEDICATIONS:  His medications are numerous, we have a sheet in the chart  that documents them.  1. Norvasc 10 mg a day.  2. Toprol 100 in the morning.  3. Lexapro 20 in the morning.  4. Flomax 0.4 in the morning.  5. Minoxidil 2.5 b.i.d.  6. Iron tablets.  7.  Imdur 60 a day.  8. Aciphex 20 a day.  9. Cerefolin in the morning.  10.Lipitor 20 a day.  11.Lasix 80 in the morning and 40 at night.  12.Baby aspirin.  13.Catapres 0.3 twenty-four hour patch.  14.Humalog 20 __________ pen.  15.Lantus 17 units at bedtime.  16.Carbidopa 25/100 breakfast, lunch and dinner.  17.Aranesp shot every 4 weeks.  18.Calcium.  19.AndroGel.   INTERNIST:  Dr. Lenord Fellers.  ENDOCRINOLOGIST:  Dr. Everardo All.  UROLOGIST:  Dr. Aldean Ast.  NEPHROLOGIST:  Dr. Annie Sable.  HEMATOLOGIST:  Dr. Arline Asp.  NEUROLOGIST:  Dr. Thad Ranger.   EXAM:  Remarkable for a chronically ill-appearing elderly white male in  no distress.  Weight is 210, blood pressure 140/60 pulse 70 and regular,  respiratory rate 14, afebrile.  Facies is a bit masked.  HEENT:  Unremarkable.  Carotids have a right carotid bruit.  No  lymphadenopathy, thyromegaly or JVP elevation.  LUNGS:  Clear with good diaphragmatic motion, no wheezing.  S1-S2 with normal heart sounds, PMI normal.  ABDOMEN:  Benign, no renal bruits, no AAA, no tenderness, no  hepatosplenomegaly or hepatojugular reflux.  Distal pulses are intact, no edema.  NEURO:  Remarkable for minimal cogwheeling at the wrists, otherwise he  is fairly limber.  No focal neurological defects, no muscular weakness.   IMPRESSION:  1. Coronary disease, previous bypass in 2003, low risk Myoview last      year, possible follow-up in October.  Continue aspirin and beta      blocker.  2. Hypercholesterolemia, last LDL under 100.  Continue statin therapy,      low cholesterol diet.  No side effects in regards to myalgias.  3. Right carotid bruit.  He did have carotid duplex done November      2008, there was no significant internal carotid artery stenoses      bilaterally.  Will probably need follow-up duplex in a year.  4. Hypertension, currently well controlled.  Continue low-salt diet,      continue numerous medications.  5. Renal failure.  Lasix  decreased due to prerenal azotemia, follow up      with Dr. Kathrene Bongo.  Continue Aranesp for hematocrit.  In      regards to his volume this may be his biggest issue.  6. Diabetes, follow-up with Dr. Everardo All.  Hemoglobin A1c quarterly.      Despite his multiple chronic illnesses including Parkinson's      disease [name] seems to be doing fairly well.  If he can increase his      activity this will help.  I will see him back in October.     Noralyn Pick. Eden Emms, MD, [facility]  Electronically Signed    PCN/MedQ  DD: 01/17/2008  DT: 01/17/2008  Job #: [id number]

## 2011-05-09 NOTE — Assessment & Plan Note (Signed)
[facility]                         GASTROENTEROLOGY OFFICE NOTE   NAME:ALLENTacoma, Merida                         MRN:          [id number]  DATE:08/08/2007                            DOB:          [birth year]    Mr. Even is a very complex 77 year old white male with insulin-  dependent diabetic and mild renal insufficiency associated with this  problem.  He has chronic anemia which is felt to be anemia of chronic  disease and is followed by Hematology.  He also has severe coronary  artery disease, peripheral neuropathy, hypertension, osteoarthritis,  depression, dyslipidemia, and diabetic retinopathy.   He is scheduled for colonoscopy at this time.  It was originally my  opinion that he had not had colonoscopy, but 10 years ago it seems this  was done as a screening procedure and was normal.  Subsequently, it  he  had colonoscopy on April 17, 2002 by Dr. Hassie Bruce at [facility].  He had two 6-mm polyps in the rectum, but exam otherwise was  unremarkable.  We do not have the pathology report.   Mr. Darden denies GI complaints at this time.  He is having regular bowel  movements without melena or hematochezia, but is on iron tablets.  He  was admitted in May 2005 with nausea and vomiting and had endoscopy with  Dr. Marina Goodell with some mild duodenitis.  CLO biopsy was obtained at that  time, but there is no record available to confirm this result.   His appetite is good and his weight is stable.  He denies any systemic  complaints such as fever, chills, nausea and vomiting, dyspepsia or  reflux symptoms.   MEDICATIONS:  1. Norvasc 10 mg a day.  2. Lexapro 20 mg a day.  3. Flomax 0.4 mg a day.  4. Minoxidil 5 mg twice a day.  5. Iron twice a day.  6. Imdur 60 mg a day.  7. Lipitor 20 mg a day.  8. Lasix 80 mg two twice a day.  9. Aspirin 325 mg a day.  10.Toprol-XL one a day.  11.Catapres-TTS on a weekly basis.  12.Varying doses of  Humalog which appear to be 20/112 -- 25 units      three times a day and Lantus 17 units at bedtime.  13.Carbidopa/levodopa 25/100 mg three times a day for Parkinson's      disease.  14.Calcium 600 mg twice a day.  15.AndroGel topical gel twice a day.   ALLERGIES:  He denies drug allergies.   PAST MEDICAL HISTORY:  Again is remarkable for coronary artery disease  and angioplasties many years ago.  He is followed regularly by Dr.  Eden Emms also for his hypertension.  He has had multiple complications  from his diabetes including mild renal insufficiency, peripheral  neuropathy.  He has chronic anxiety and depression,  hypercholesterolemia, a history in the past of gastroesophageal reflux  disease, and as mentioned above, chronic anemia.  He has recently been  diagnosed as having Parkinson's disease and is on carbidopa.  Also,  review of his chart shows  that he had coronary artery bypass grafting in  2005 by Dr. Donata Clay.   FAMILY HISTORY:  Noncontributory in terms of gastrointestinal problems.   REVIEW OF SYSTEMS:  The patient denies current cardiovascular complaints  such as palpitations, chest pain with exertion, shortness of breath,  dyspnea, etc.  He also denies cough, sputum production or hemoptysis.  His wife relates that his memory is decreasing rather steadily and he  may have associated mild dementia.  He has had no recent problems with  bleeding difficulties.  I cannot see on reviewing his chart where he has  ever had TIAs or CVAs or arrhythmias.  He is followed closely by Dr.  Everardo All for his diabetes.  His hemoglobin A1c in February was 8.   SOCIAL HISTORY:  The patient is married and lives with his wife.  He has  a high school education.  He is currently retired.  He does not smoke or  use ethanol.   PHYSICAL EXAMINATION:  He is a healthy-appearing, but slightly depressed-  appearing white male appearing his stated age, in no acute distress.  He is 6-feet 1-inch tall  and weighs 214 pounds.  Blood pressure is  160/70 and pulse was 72 and regular.  I could not appreciate stigmata of  chronic liver disease.  CHEST:  Clear and he appeared to be in regular rhythm without murmurs,  gallops or rubs.  There was on hepatosplenomegaly, abdominal masses or tenderness.  Bowel  sounds were normal.  There was trace peripheral edema.  Mental status was clear.  Inspection of the rectum was unremarkable, as was rectal exam.  There  was formed stool that was dark in color and was +1 guaiac-positive.   ASSESSMENT:  1. Guaiac-positive stools in a 77 year old white male with multiple      medical problems on daily salicylate therapy.  He does have a      history of colon polyps discovered 5 years ago at [facility].  2. Insulin-dependent diabetes with multiple complications.  3. Coronary artery disease with previous stenting and bypass surgery,      on aspirin.  4. New-onset Parkinson's disease.  5. Hypertension and hyperlipidemia.  6. Mild chronic renal insufficiency.   RECOMMENDATIONS:  I have gone ahead and set Mr. Whitaker up for followup  colonoscopy with adjustments in his insulin doses.  We will ask Dr.  Everardo All to send some recommendations concerning this, but we usually  give half the evening dose of insulin and hold the morning dose before  the procedure with a morning/a.m. scheduled appointment.  I will hold  his aspirin a week  before his procedure.  Continue all of his other multiple medications as  listed above.  If his colonoscopy is unremarkable, we will probably  proceed with endoscopic exam.     Vania Rea. Jarold Motto, MD, [facility], FAGA  Electronically Signed    DRP/MedQ  DD: 08/08/2007  DT: 08/09/2007  Job #: [id number]   cc:   Noralyn Pick. Eden Emms, MD, [facility]  Sean A. Everardo All, MD  Luanna Cole. Lenord Fellers, M.D.  Cecille Aver, M.D.  Samul Dada, M.D.  Courtney Paris, M.D.

## 2011-05-09 NOTE — Assessment & Plan Note (Signed)
[facility]                            CARDIOLOGY OFFICE NOTE   NAME:ALLENAllin, Frix                         MRN:          [id number]  DATE:10/09/2007                            DOB:          10-07-1933    Mr. Magnan returns today for followup.   He is a fairly complicated patient.  The patient has a history of  insulin dependent diabetes, mild renal insufficiency, previous coronary  bypass surgery, no stress test in the last 3 years.  I explained to Mr.  Cuevas that he needs to take a little bit better care of himself.  His  last hemoglobin A1c was 8 in February.  His baseline creatinine appears  to be in the 2.1 range, although it has been as high as 3.  He has known  coronary artery disease with bypass surgery in 2003.   His last Myoview was in January 2006, and it was normal with an EF of  65%.  However, I told Dailyn that given his diabetes that I thought he  should have a stress test every 3 years.  We will arrange for him to  have an adenosine Myoview in the next few weeks.  I explained to him  unless it was markedly abnormal, I would not proceed with heart cath  given his diabetes and renal insufficiency.   REVIEW OF SYSTEMS:  Remarkable for significant urinary tract problems.  He sees Dr. Aldean Ast for this.  He was recently in the hospital for a  UTI.  He also has significant problems with psoriasis in his legs.  I  explained to him that this would put him at high risk for cellulitis and  that this needs to be cared for fairly aggressively.   The patient is currently not smoking.   CURRENT MEDICATIONS:  1. Metaxalone 2.5 mg on Monday, Wednesday and Friday.  2. Aciphex 20 a day.  3. Norvasc 10 a day.  4. Lexapro 20 a day.  5. Flomax 0.4 a day.  6. Minoxidil 2.5 tablets, 5 mg, b.i.d.  7. Iron.  8. Imdur 60 a day.  9. Lipitor 20 a day.  10.Lasix 160 b.i.d.  11.Aspirin daily.  12.Toprol 100 a day.  13.Catapres patch every 3 days.  14.Insulin, Humalog and Lantus as directed.  15.Carbidopa/levodopa 25/100 t.i.d.  16.AndroGel.   PHYSICAL EXAMINATION:  GENERAL:  Remarkable for somewhat of a masked  facies.  VITAL SIGNS:  Weight is 202, respiratory rate is 14, blood pressure is  140/60, pulse 56 and regular, afebrile.  HEENT:  Unremarkable.  NECK:  Carotids are normal.  There is no lymphadenopathy.  No  thyromegaly.  No JVP elevation.  LUNGS:  Clear with good diaphragmatic motion.  No wheezing.  HEART:  There is an S1 S2 with normal heart sounds.  PMI is normal.  ABDOMEN:  Benign.  Bowel sounds are positive.  No hepatosplenomegaly.  No hepatojugular reflux.  No bruit.  No tenderness. No AAA.  Femorals  are plus 3 bilaterally without bruit.  CHEST:  Sternum is well healed.  EXTREMITIES:  PTs are  plus 2.  There is no lower extremity edema.  NEUROLOGIC:  Nonfocal but he does have a bit of rigidity in the upper  extremities.  There is no muscular weakness.   IMPRESSION:  1. Coronary disease with previous coronary artery bypass graft.      Followup adenosine Myoview.  Continue beta-blocker and aspirin.  2. Hyperlipidemia.  Continue Statin drug, particularly in light of his      old bypass grafts.  Lipitor 20 a day.  Lipid and liver profile in 6      months.  3. Significant hypertension likely related to his chronic renal      insufficiency.  Continue current therapy although he is on      approximately six drugs, it is currently working and he is still      urinating.  Follow with Dr. Kathrene Bongo to follow his creatinine      baseline in the 2 to 2.5 range.  4. Neurological problems with Parkinson disease.  Continue carbidopa      therapy.  Follow up with neurologist.  5. Diabetes, poorly controlled.  Hemoglobin A1c 8.  Follow up with Dr.      Everardo All for a more intensive therapy, particularly in light of his      coronary bypass surgery.  Continue current dose of ACE inhibitor      but we will need to watch  closely in regards to his creatinine.   Overall, I think Sabastien is doing okay and I will see him back in a year if  his Myoview is low risk.     Noralyn Pick. Eden Emms, MD, [facility]  Electronically Signed    PCN/MedQ  DD: 10/09/2007  DT: 10/10/2007  Job #: [id number]

## 2011-05-09 NOTE — Cardiovascular Report (Signed)
NAMEPIKE, SCANTLEBURY                  ACCOUNT NO.:  [id number]   MEDICAL RECORD NO.:  [id number]          PATIENT TYPE:  INP   LOCATION:  2910                         FACILITY:  [facility]   PHYSICIAN:  Rollene Rotunda, MD, FACCDATE OF BIRTH:  Jun 07, 1933   DATE OF PROCEDURE:  11/08/2007  DATE OF DISCHARGE:                            CARDIAC CATHETERIZATION   PRIMARY CARE PHYSICIAN:  Hettie Holstein, D.O.   PROCEDURE:  Right heart catheterization.   INDICATIONS:  Evaluate patient with respiratory distress and  questionable pulmonary hypertension.   PROCEDURE NOTE:  The right heart catheterization performed via the Left  internal jugular vein.  There was an indwelling triple-lumen catheter.  We sterilized this catheter with copious Betadine solution.  I then  exchanged using a long wire, removing that catheter, retaining the wire  and inserting a 8-French sheath.  Under fluoroscopic guidance, I  inserted a Swan-Ganz catheter.  This was maneuver easily into the  pulmonary artery.  There were no apparent complications.  The patient  left the lab in stable addition.   HEMODYNAMIC RESULTS:  RA with mean of 7, RV 43/4, PA 34/15 with a mean  of 25.  Pulmonary capillary wedge pressure with mean 13.  Cardiac  output/cardiac index (Fick) 5.6.  Pulmonary vascular resistance 171.   CONCLUSION:  The patient has no pulmonary hypertension.  The pulmonary  artery mean pressure is at the upper limits of normal.  Pulmonary  vascular resistance is low.   PLAN:  The patient will continue to have management per his primary team  and Dr. Excell Seltzer.      Rollene Rotunda, MD, [facility]  Electronically Signed     JH/MEDQ  D:  11/08/2007  T:  11/09/2007  Job:  [id number]

## 2011-05-09 NOTE — Discharge Summary (Signed)
NAMELAMARCUS, SPIRA                  ACCOUNT NO.:  [id number]   MEDICAL RECORD NO.:  [id number]          PATIENT TYPE:  INP   LOCATION:  3740                         FACILITY:  [facility]   PHYSICIAN:  Doylene Canning. Ladona Ridgel, MD    DATE OF BIRTH:  [birth year]   DATE OF ADMISSION:  11/06/2007  DATE OF DISCHARGE:  11/11/2007                               DISCHARGE SUMMARY   PROCEDURES:  1. Right heart cardiac catheterization.  2. Renal ultrasound.  3. VQ scan.  4. CT of the chest without contrast.  5. 2-D echocardiogram.   PRIMARY FINAL DIAGNOSIS:  Acute on chronic diastolic congestive heart  failure.   SECONDARY DIAGNOSES:  1. Stage IV chronic kidney disease with a BUN of 85, creatinine 2.87      and GFR of 22 at discharge.  2. History of aortocoronary bypass surgery IN 2003 with left internal      mammary artery to left anterior descending, saphenous vein graft to      diagonal 1, saphenous vein graft to posterior descending artery.  3. Hypertension.  4. Hyperlipidemia.  5. Gastroesophageal reflux disease.  6. Allergy or intolerance to Procardia with swelling.  7. History of silent prostatism.  8. Anemia.  9. Remote history of angioplasty in 1990.  10.Insulin-dependent diabetes.  11.History of bilateral cataract surgery.  12.Family history of cerebrovascular accident.  13.History of Parkinson's disease.  14.History of pneumonia.  15.Obstructive sleep apnea on CPAP.   Discharge time:  46 minutes.   HOSPITAL COURSE:  Mr. Faulkenberry is a 77 year old male with a history of coronary artery  disease.  He initially went to [facility] for fever, nausea,  vomiting, and shortness of breath.  He was found to have pneumonia and  admitted for further evaluation and started on antibiotics.   He was known to have a history of diastolic dysfunction.  He was started  on antibiotics, but based on his physical exam and chest x-ray there was  consideration for a heart failure etiology of his  symptoms.  His BUN and  creatinine were worsening and his chest x-ray was worsening as well.  An  echocardiogram was performed which showed a PAS of 49 and EF 55-65% with  mildly increased left ventricular wall thickness and diastolic  dysfunction.  He had high left ventricular filling pressures.  Was  evaluated by cardiology and transferred to [facility] to step-  down for further evaluation and aggressive diuresis.  A pulmonary  consult was called as well and Dr. Sherene Sires also felt that while he should  continue treatment for pneumonia, his heart failure was more of an issue  at this time.   Internal jugular catheter was placed to help follow his CVPs which were  elevated at 18.  His BNP was initially greater than 500 but prior to  discharge had decreased to 143.  With diuresis his renal function was  followed closely and his admission BUN 54 and  creatinine 2.6.  At  discharge his BUN was 85 with creatinine 2.87.  He will be followed  closely as  an outpatient.   As his respiratory status improved, he was transferred to telemetry.  He  was on BiPAP while he was in the hospital  but is to resume CPAP at  home.  He was able to increase his activity and his general medical  condition improved.  By discharge he did not require continuous oxygen.   As he improved the pulmonary service changed his antibiotics from IV to  p.o.  Dr. Delford Field felt that he did not need home nebulizers or  bronchodilators.  He can follow up with Dr. Sherene Sires on a p.r.n. basis.  A  right heart catheterization had been performed to see if he had  pulmonary hypertension.  This was performed on the November 14 after his  respiratory status had improved.  His right artery had a mean pressure  of 7.  Pulmonary artery pressures were 34/15 with a mean of 25, and a  wedge of 13.  Dr. Antoine Poche felt that he had no pulmonary hypertension  and pulmonary artery mean pressures of the upper limits of normal with  lobe  pulmonary vascular resistance.   By November 11, 2007, Mr. Casalino was ambulating with the improved  respiratory status and no chest pain.  Of note, his cardiac enzymes had  been checked during this hospital stay and were negative.  His O2  saturation was 96% on room air.  He was evaluated by Dr. Delford Field and Dr.  Ladona Ridgel and considered stable for discharge with outpatient follow-up  arranged.   DISCHARGE INSTRUCTIONS:  1. His activity level to be increased gradually.  2. He is to weigh himself daily.   FOLLOW UP:  He is to follow up with Dr. Eden Emms on December 4 at 10:15.  He is to follow up with Dr. Lenord Fellers, Dr. Everardo All, Dr. Thad Ranger, Dr.  Jarold Motto, and Dr. Sherene Sires as needed or as scheduled.   DISCHARGE MEDICATIONS:  1. Avelox 400 mg daily for five more days.  2. Flomax 0.4 mg daily.  3. Imdur 60 mg a day.  4. Toprol XL 100 mg daily.  5. Catapres TTS 0.3 patch weekly.  6. Sinemet 25/100 t.i.d.  7. AcipHex 20 mg a day.  8. Aspirin 81 mg a day.  9. Lantus 20 units daily p.m. and Humalog long sliding scale as prior      to admission.  10.Norvasc 10 mg a day.  11.Lipitor 20 mg a day.  12.Minoxidil 5 mg b.i.d.  13.Furosemide 40 mg b.i.d.  14.Lexapro 20 mg daily.  15.Iron 325 mg b.i.d.  16.Tequin as prescribed as prior to admission.  17.Metolazone is on hold for now.      Theodore Demark, PA-C      Doylene Canning. Ladona Ridgel, MD  Electronically Signed    RB/MEDQ  D:  11/11/2007  T:  11/12/2007  Job:  [id number]   cc:   Luanna Cole. Lenord Fellers, M.D.  Sean A. Everardo All, MD  Marolyn Hammock. Thad Ranger, M.D.  Vania Rea. Jarold Motto, MD, Caleen Essex, FAGA  Charlaine Dalton Sherene Sires, MD, FCCP

## 2011-05-09 NOTE — Assessment & Plan Note (Signed)
[facility]                            CARDIOLOGY OFFICE NOTE   NAME:ALLENOhn, Bostic                         MRN:          [id number]  DATE:10/05/2008                            DOB:          Dec 08, 1933    Mr. Sitzmann returns today for followup of his coronary artery disease with  previous CABG in 2003.  He unfortunately has chronic renal failure with  a creatinine in the mid 2s and diabetes.  He is followed by Dr.  Kathrene Bongo.   He is not a candidate for cath unless he has urgent ischemic symptoms.   His last Myoview was done on October 22, 2007.  It was low risk with  thinning of the inferior base but no ischemia.  Unfortunately, we cannot  do yearly Myoviews on him without symptoms, per his insurance company.   The patient seems to doing well.  He is not having chest pain.  There is  no PND or orthopnea.  His Parkinson disease seems to be getting a little  bit worse.  He sees Dr. Thad Ranger for this.   He has chronic lower extremity edema.  His Lasix has recently been  increased to 120 twice a day per Dr. Kathrene Bongo.   His current medications include:  1. Aspirin a day.  2. Cerefolin daily.  3. Reglan 10 a day.  4. Citalopram 20 a day.  5. Prilosec.  6. Lasix 120 b.i.d.  7. Norvasc 10 a day.  8. Flomax 0.4 a day.  9. Minoxidil 5 mg b.i.d.  10.Imdur 60 a day.  11.Lipitor 20 a day.  12.Toprol 100 in the morning.  13.Catapres patch 1 a week.  14.Humalog sliding scale with meals.  15.Lantus 17 nightly.  16.Carbidopa 25/100 t.i.d.  17.Tylenol.  18.Calcium.  19.AndroGel.   PHYSICAL EXAMINATION:  GENERAL:  Remarkable for elderly white male with  a masked facies.  VITAL SIGNS:  Weight 198, blood pressure 150/70, pulse 60 and regular,  respiratory rate 14, and afebrile.  HEENT:  Unremarkable.  NECK:  Carotids have a right bruit.  No lymphadenopathy, thyromegaly, or  JVP elevation.  LUNGS:  Clear with good diaphragmatic motion.  No  wheezing.  CARDIOVASCULAR:  S1 and S2 with a systolic ejection murmur.  PMI normal.  ABDOMEN:  Benign.  Bowel sounds positive.  No AAA, no tenderness, no  bruit, no hepatosplenomegaly, no hepatojugular reflux, and no  tenderness.  EXTREMITIES:  Distal pulses are intact with trace edema.  He has some  eczema in his right thumb and lower extremities.  He has significant  cogwheeling and rigidity, right greater than left wrist in a masked  facies.   His baseline creatinine tends to run around 2.5-2.6.   IMPRESSION:  1. Coronary artery disease, previous coronary artery bypass grafting      in 2003, not having chest pain, low-risk Myoview last year.      Continue aspirin and beta-blocker.  2. Hypertension currently well controlled on a slew of medications.      Continue low-salt diet.  Try to move Catapres patch around, so it  does not cause skin irritation.  3. Parkinson disease.  Follow up with Dr. Thad Ranger.  May need a      holiday from his Sinemet.  Continue citalopram.  4. Chronic renal failure.  Follow up with Dr. Kathrene Bongo, high risk      given his diabetes.  Continue current dose of Lasix.  Appears      euvolemic.  5. Diabetes.  Follow up with primary care MD.  Hemoglobin A1c      quarterly.  Diet control will be important in regards to limiting      his renal failure.   I will see him back in 6 months' time.  His EKG today showed sinus  rhythm with poor R-wave progression and LVH.      Noralyn Pick. Eden Emms, MD, [facility]  Electronically Signed    PCN/MedQ  DD: 10/05/2008  DT: 10/06/2008  Job #: [id number]

## 2011-05-09 NOTE — Assessment & Plan Note (Signed)
OFFICE VISIT   [name], [name]  DOB:  [birth year]                                       11/25/2009  JYNWG#:[id number]   The patient had a left forearm AV fistula placed on 10/13/2009.  He  comes in for a 6 week followup visit.  He overall has been doing well  and has no specific complaints.  He has dialyzed in [place] on  Mondays, Wednesdays and Fridays via his Palindrome catheter.   PHYSICAL EXAMINATION:  On examination blood pressure is 127/67, heart  rate is 76.  His incision in the left wrist has healed nicely.  He has  an excellent thrill in his fistula and the vein appears to be maturing  nicely.   Hopefully this can be used in about a month.  Once we know the fistula  is working we can get his catheter out.  We will see him back p.r.n.   Di Kindle. Edilia Bo, M.D.  Electronically Signed   CSD/MEDQ  D:  11/25/2009  T:  11/26/2009  Job:  [id number]

## 2011-05-09 NOTE — Assessment & Plan Note (Signed)
[facility]                            CARDIOLOGY OFFICE NOTE   NAME:[name], Jones                         MRN:          [id number]  DATE:11/29/2007                            DOB:          1933-05-04    Mr. [name] returns today for followup.  He was recently hospitalized from  November 12 to November 17.   I was not involved with his care at this time.  He had what was called  acute on chronic diastolic heart failure.  He has significant chronic  renal failure with creatinines running in the 2.5 to 3 range.  He has  previous coronary bypass surgery in 2003 with a mammary to the LAD, vein  graft to the diagonal, vein graft to the PDA.  He has hypertension and  hyperlipidemia.   The patient was treated for some respiratory insufficiency in the  hospital.  There was a question of pulmonary hypertension.  He had a  right-heart catheterization done by Dr. Antoine Poche.  There was no evidence  of pulmonary hypertension.  Since hospital discharge, he has felt fairly  well.   He has had resolution of his shortness of breath.  There have been no  fevers, cough, or sputum production.  While in the hospital, his 2D  echocardiogram showed normal LV function with an EF of 55-65%.   Interestingly, he had only mild tricuspid insufficiency with an  estimated peak PA systolic pressure of 49.   The patient's review of systems is otherwise negative.   MEDICATIONS INCLUDE:  1. AcipHex 20 a day.  2. Lasix 80 b.i.d.  3. An aspirin a day.  4. Cerefolin daily.  5. Norvasc 10 a day.  6. Lexapro 20 a day.  7. Flomax 0.4 a day.  8. Minoxidil 5 b.i.d.  9. Imdur 60 a day.  10.Lipitor 20 a day.  11.Toprol 100 in the morning.  12.Catapres patch once a week.  13.Humalog.  14.Lantus as directed.  15.Sinemet 25/100 t.i.d.  16.Tylenol.  17.Calcium.  18.AndroGel.   EXAM:  Remarkable for a chronically ill-appearing, middle-aged, white  male in no distress.  His  respiratory rate was 16, weight was 198, blood pressure was 150/72,  pulse was 63 and regular.  Afebrile.  HEENT:  Unremarkable.  Carotids normal without bruit.  No lymphadenopathy, thyromegaly or JVP  elevation.  He has a scar in the left IJ area from his catheterization.  LUNGS:  Clear, good diaphragmatic motion, no wheezing.  S1, S2 with normal heart sounds.  PMI normal.  ABDOMEN:  Benign.  Bowel sounds positive, no AAA, no tenderness, no  hepatosplenomegaly or hepatojugular reflux.  Distal pulses are intact with trace edema.  NEUROLOGIC:  Nonfocal, no muscular weakness.   Baseline EKG normal.   IMPRESSION:  1. Previous coronary bypass surgery, no angina, no recent MI.      Continue current medical therapy, including aspirin.  2. Recent hospitalization for respiratory insufficiency, appears to be      improved.  Follow with Dr. Delford Field.  May need another chest x-ray to  document clearing of pneumonia.  3. Chronic renal failure.  I suspect this is his biggest issue      regarding diastolic dysfunction and fluid overload.  He needs to      follow up with [facility].  We will check his BNP and BMET      today to see where he sits.  He is currently on 80 of Lasix b.i.d.      with only trace edema.  4. History of reflux.  Continue Aciphex 20 mg a day.  Avoid spicy food      and late-night meals.  5. Diabetes.  Continue current dosages of Lantus and Humalog.      Hemoglobin A1c quarterly.  This makes a prognostic importance of a      creatinine of 2.3 to 3 worse.  I suspect the patient will need to      be on dialysis within two years.     Noralyn Pick. Eden Emms, MD, [facility]  Electronically Signed    PCN/MedQ  DD: 11/29/2007  DT: 11/29/2007  Job #: [id number]

## 2011-05-09 NOTE — H&P (Signed)
NAMEJANET, DECESARE                  ACCOUNT NO.:  [id number]   MEDICAL RECORD NO.:  [id number]          PATIENT TYPE:  INP   LOCATION:  1416                         FACILITY:  [facility]   PHYSICIAN:  Hettie Holstein, D.O.    DATE OF BIRTH:  11/20/1933   DATE OF ADMISSION:  11/03/2007  DATE OF DISCHARGE:                              HISTORY & PHYSICAL   PRIMARY CARE PHYSICIAN:  Luanna Cole. Lenord Fellers, M.D.   PRIMARY ENDOCRINOLOGIST:  Cleophas Dunker. Everardo All, MD.   PRIMARY NEUROLOGIST:  Marolyn Hammock. Thad Ranger, M.D.   PRIMARY CARDIOLOGIST:  Noralyn Pick. Eden Emms, MD, [facility].   PRIMARY GASTROENTEROLOGIST:  Vania Rea. Jarold Motto, MD, [facility], FACP, FAGA.   CHIEF COMPLAINT:  Nausea, vomiting and weakness as well as fever.   HISTORY OF PRESENT ILLNESS:  Mr. Lengacher is a quite pleasant 77 year old  male with extensive medical history, who sees multiple subspecialists,  and on multiple prescribed medications, who awoke this morning feeling  weak.  He notified his family.  He is typically able to ambulate,  however, he required assistance today.  Subsequently developed several  episodes of bilious emesis at home.  He had been noted to have  subjective temperatures and was sent to the emergency department.  In  the emergency department, he was found to have a low grade temperature  in addition to radiographic evidence of pneumonia.  In any event he  reports that he had been in his usual state of health.  He underwent a  treatment course for prostatitis recently.  No urinalysis is available  at time of this dictation.  He underwent a rectal examination by Dr. Rosalia Hammers  without prostate tenderness according to him.  In any event, he has  chronic renal insufficiency with previous creatinine on September 16, 2007, of 2.8 as well as 2.6 today.  He does follow with Cecille Aver, M.D., with stage IV chronic kidney disease.   PAST MEDICAL HISTORY:  Significant for  1. Severe coronary disease status post three-vessel bypass in  2003.      He underwent a recent [facility] visit with Dr. Eden Emms and      apparently underwent a stress Myoview which the family reports was      normal.  As it is currently Sunday, I do not have access to these      records.  I will request his most recent cardiac workup to be sent      to the hospital.  2. Hyperlipidemia.  3. Hypertension.  4. Parkinson's and he follows with Dr. Thad Ranger.  5. Diabetes, he follows with Dr. Everardo All.  6. Peripheral neuropathy.  7. Obstructive sleep apnea on chronic CPAP.  8. Anemia of chronic disease.  9. Recently diagnosed with B12 deficiency per neurologist, Dr.      Thad Ranger.  10.Previous history of Strep pyogenes bacteremia due to cellulitis.  11.Additionally, he sees Dr. Samul Dada, M.D., of hematology,      for anemia and Dr. Aldean Ast for urinary problems and infections.   MEDICATIONS:  As provided by family list.  1.  Norvasc 10 mg a day.  2. Lexapro 20 mg daily.  3. Flomax 0.4 mg daily.  4. Minoxidil 2.5 mg b.i.d.  5. Iron tablet b.i.d.  6. Imdur 60 mg daily.  7. Lipitor 20 mg daily.  8. Furosemide 80 mg b.i.d.  9. Aspirin 325 mg every other day.  10.Toprol XL 100 mg daily.  11.Catapres 0.3 mg patch weekly.  12.Humalog pen.  13.Lantus 17 units nightly.  14.Carbidopa/levodopa 25/100 t.i.d.  15.Aranesp shot at [facility] every 4 weeks.  16.Tylenol 500 mg p.r.n.  17.Calcium 600 b.i.d. p.r.n.  18.AndroGel 1% topical q.a.m. and p.m. per Dr. Aldean Ast.  19.Metolazone 2.5 mg Monday, Wednesday, Friday.  20.AcipHex 20 mg daily per Dr. Jarold Motto.  21.Taclonex per his dermatologist, Dr. Purcell Nails, at [facility].   ALLERGIES:  He is allergic to [facility].   SOCIAL HISTORY:  He lives with his family, reachable at [phone].  He denies tobacco or alcohol.  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   FAMILY HISTORY:   Noncontributory.   REVIEW OF SYSTEMS:  According to the family, he has been in his usual  state of health.  No bloody emesis or bloody stools, no urinary problems  or complaints.  No swelling of his lower extremities.  No abdominal  pain.  Only that which is described in the history of present illness.   PHYSICAL EXAMINATION IN THE EMERGENCY DEPARTMENT:  VITAL SIGNS:  His  temperature was 99.8 rectal, blood pressure 150/62, heart rate 72,  respirations 20.  HEENT:  Head normocephalic, atraumatic.  Extraocular muscles intact.  NECK:  Supple, nontender.  No palpable thyromegaly or mass.  CARDIOVASCULAR:  Normal S1-S2 without S3 or S4.  LUNGS:  Crackles in his bases bilaterally and dullness to percussion  about a quarter of the way up on both sides.  His effort was nonlabored.  ABDOMEN:  Soft and nontender without rebound or guarding.  No right  upper quadrant or right lower quadrant tenderness.  LOWER EXTREMITIES:  No calf tenderness or edema.  He exhibited symmetric  goal peripheral pulses.  No cyanosis or clubbing.   LABORATORY DATA:  His WBC 7.3, hemoglobin 11.3, platelets 165, MCV of  91.  BNP of 567.  His sodium was 137, potassium 5, BUN 54, creatinine  2.6 and glucose of 280.  Albumin is 3.8, AST and ALT was within normal  range, total bili was only mildly elevated at 1.8.  His GFR was  estimated at 24.  D-dimer 0.36.  Hemoglobin A1c from within the past  month was 6.8.  Last ejection fraction was 55-65%, though he has had  more recent study that we will attempt to locate after the weekend.   Chest x-ray reveals asymmetric bilateral infiltrates or edema with small  effusions.   ASSESSMENT:  1. Pneumonia, community-acquired.  2. Pulmonary edema, probable diastolic and renal mediated congestive      heart failure.  3. Chronic kidney disease, stage IV.  4. Coronary artery disease status post coronary artery bypass grafting      with a recent Myoview which we will request for  records.  His EKG      reveals some lateral T-wave flattening, though this is similar to      prior EKG tracings reviewed in the emergency department.  5. Diabetes as described above.  He had a recent hemoglobin  A1c of      6.8.  He follows with an endocrinologist for outpatient management      of his diabetes.  6. Recently diagnosed B12 deficiency.  7. Anemia of chronic disease.  8. Hypertension, suboptimal control at present, though in the setting      of dynamic illness, we will refrain from over-adjusting his      medications.   PLAN:  At this time, Mr. Mclean will be admitted.  Blood cultures were  obtained in the emergency department initially.  He already underwent  infusion of IV antibiotics, will continue these throughout his hospital  course and follow his clinical response.  Follow his renal function as  well and follow on telemetry floor as his BNP is elevated at 567, though  I suspect in the setting of his chronic renal insufficiency, this is  more nonspecific.  In any event, we will follow his clinical course  accordingly.      Hettie Holstein, D.O.  Electronically Signed     ESS/MEDQ  D:  11/03/2007  T:  11/04/2007  Job:  [id number]   cc:   Luanna Cole. Lenord Fellers, M.D.  Sean A. Everardo All, MD  Marolyn Hammock. Thad Ranger, M.D.  Noralyn Pick. Eden Emms, MD, [facility]  Vania Rea. Jarold Motto, MD, Caleen Essex, FAGA

## 2011-05-09 NOTE — Consult Note (Signed)
[name], [name]                  ACCOUNT NO.:  [id number]   MEDICAL RECORD NO.:  [id number]          PATIENT TYPE:  INP   LOCATION:  1228                         FACILITY:  [facility]   PHYSICIAN:  Jesse Sans. Wall, MD, FACCDATE OF BIRTH:  01/28/1933   DATE OF CONSULTATION:  DATE OF DISCHARGE:                                 CONSULTATION   We were asked by the [facility] medical team to evaluate [name] with  progressive heart failure.   HISTORY OF PRESENT ILLNESS:  [name] is a 77 year old gentleman who  has a history of coronary artery disease and diastolic dysfunction who  is admitted with weakness and fever.  He was also having some  significant nausea.   His initial chest x-ray showed a question of pneumonia versus asymmetric  pulmonary edema.   He was treated with antibiotics with azithromycin and yesterday received  40 mg of IV Lasix x2.  He has also received 40 mg of IV Lasix x2 today.   PAST MEDICAL HISTORY:  Significant for coronary bypass surgery in 2003.  He had a left anterior mammary graft sliced to the LAD, vein graft to a  diagonal 1, vein graft to PDA.   His echocardiogram in 2007 showed an EF of 55% with diastolic  dysfunction.   He has a history of chronic renal insufficiency with creatinine around  2.5.  He is followed by Dr. Annie Sable of __________  Nephrology.  He has a history of severe hypertension and has been  treated with minoxidil, as well as multiple other agents.  His renal  insufficiency required metolazone 3 times a week and Lasix 80 mg p.o.  b.i.d.   He has had a PCI in 1990.  He has a history of diabetes, hypertension as  mentioned, hypertriglyceridemia, history of peripheral neuropathy.  He  has a history of anemia and depression.   His history is also significant of B12 insufficiency, prostatitis,  Parkinson's disease, obstructive sleep apnea on CPAP, gastritis, history  of colonic polyps.  He has had cataract extraction, right  wrist surgery  and colon and EGD in the past.   ALLERGIES:  PROCARDIA.   MEDICATIONS:  Prior to admission  1. Lexapro 20 mg a day.  2. Flomax 0.4 mg a day.  3. Norvasc 10 mg a day.  4. Minoxidil 2.5 mg p.o. b.i.d.  5. Iron b.i.d.  6. Imdur 60 mg a day.  7. Lipitor 20 mg a day.  8. Lasix 80 mg p.o. b.i.d.  9. Aspirin 325 p.o. every other day.  10.Toprol XL 100 mg a day.  11.Catapres TTS patch #3.  12.Humalog p.r.n.  13.Carbidopa/levodopa 25/100 t.i.d.  14.AndroGel b.i.d.  15.Metolazone 2.5 mg Monday, Wednesday and Friday.   FAMILY HISTORY:  Noncontributory.   REVIEW OF SYSTEMS:  Other than the HPI, is negative.   PHYSICAL EXAMINATION:  GENERAL:  He is kind of slumped down in the bed  wearing 02 mask at 6 liters.  He is chronically ill.  He is in no acute  distress.  He is alert and oriented.  VITAL  SIGNS:  His blood pressure is 140/74, pulse 79 and regular.  His  respiratory rate is 20.  Temperature 99.8.  His saturation was 89% on 6  liters.  SKIN:  Warm and dry.  HEENT:  Normocephalic, atraumatic.  PERRLA.  Extraocular intact.  Sclerae clear.  Facial symmetry is normal.  NECK:  Supple, Carotids are full bilaterally without bruits or  significant JVD at 30 degrees.  HEART:  Reveals a normal S1, S2 without gallop.  His PMI was difficult  to appreciate.  ABDOMEN:  Soft, fingers below the right costal margin 2 finger breadths.  There is slight tenderness there.  There is no obvious organomegaly.  EXTREMITIES:  Reveal only trace to 1+ pitting edema.  Pulses are intact.  His feet were warm.  NEURO:  Grossly intact.   CT scan today shows bilateral pleural effusions, left greater than  right, bibasilar atelectasis and reactive nodes.  Cannot exclude left  lower lobe infiltrate.  __________ .  Echo was EF of 55-65%, severe  diastolic dysfunction, almost restrictive in nature with increased LV  pressures __________.  Hemoglobin 9.6, white count 7.5, potassium 4.6,  BUN and  creatinine have increased to 62 and 3.08.  __________ enzymes  were negative.  BNP has gone from 567 to 297 to 403.  Blood gas on room  air showed a pH of 7.39, PO2 of 37.4, PCO2 of 43.6.   ASSESSMENT:  1. Acute on chronic diastolic congestive heart failure.  2. Stage IV chronic kidney disease with increase in BUN and      creatinine.  3. History of severe hypertension.  4. Low grade fever and question of underlying pneumonia.  5. Coronary artery disease status post coronary artery bypass graft.  6. Type 3 diabetes.  7. Peripheral neuropathy.  8. Anemia.  9. Depression.  10.Parkinson's disease.  11.Obstructive sleep apnea on CPAP.   With his increasing BUN and creatinine, worsening chest x-ray and CT  scan, __________ hypoxemia and 2D echo findings, we recommend  transferring him [facility] to __________ step-down.   Tonight we will give him 80 mg of IV Lasix and watch him closely.  If he  does not respond well, we will consider IV Imitrex in the morning.  In  addition we will restart metolazone at 5 mg p.o. 30 minutes an 80 mg IV  Lasix dose in the morning.   The plans were discussed with the patient and his daughter.  They agreed  to proceed.      Thomas C. Daleen Squibb, MD, [facility]  Electronically Signed     TCW/MEDQ  D:  11/05/2007  T:  11/06/2007  Job:  [id number]   cc:   Dakota Pick. Eden Emms, MD, [facility]  [address]  [address]  [place]  [place] [address]   [name], M.D.  Fax: [phone]

## 2011-05-12 NOTE — H&P (Signed)
[name], [name]                            ACCOUNT NO.:  [id number]   MEDICAL RECORD NO.:  [id number]                   PATIENT TYPE:  INP   LOCATION:  5705                                 FACILITY:  [facility]   PHYSICIAN:  [name], M.D.                DATE OF BIRTH:  [birth year]   DATE OF ADMISSION:  05/09/2004  DATE OF DISCHARGE:                                HISTORY & PHYSICAL   CHIEF COMPLAINT:  Weakness.   HISTORY OF PRESENT ILLNESS:  This 77 year old white male was admitted to  this hospital, Apr 29, 2004 through May 04, 2004, with acute renal failure  superimposed on chronic renal failure.  His baseline creatinine in February  of this year had been approximately 2.2 when he was hospitalized at [facility] in [place] for problems with palpitations and  hypertension.  During his recent admission, his creatinine had worsened  significantly to 4.6 on admission, but it did improve with IV fluid  hydration with normal saline and the insertion of a Foley catheter.  Renal  ultrasound had suggested the patient had bladder outlet obstruction.  The  patient had no prostate symptoms whatsoever.  He was seen in consultation by  Dr. Courtney Jones, who felt the patient had silent prostatism.  He  was started on Flomax daily and was sent home with a Foley catheter in  place, with plans to remove it prior to coming to see Dr. Aldean Jones on May 11, 2004.  The patient says that the catheter bag fills up about every 5  hours and he has to empty it.  Therefore, it does not look to be obstructed  whatsoever.  When he came to the office today for followup on his recent  hospitalization, he was complaining of generalized weakness.  I drew a STAT  BMET and his creatinine was 4.1.  By the time he was discharged on May 04, 2004, his creatinine had improved to 2.2, so this was a significant  increase.  He had not been vomiting.  He had no orthostasis whatsoever.  We  did  a STAT urine sodium and the result is 79.  He has been on Demadex 20 mg  daily since discharge, as this was a medication he took prior to coming to  the hospital on Apr 29, 2004.  His lisinopril had been discontinued during  the hospitalization of Apr 29, 2004, thus, the only thing different that we  have done for this gentleman is insert a Foley catheter, give him Flomax,  give him IV fluid hydration with normal saline for a few days, stop his  lisinopril.  The only thing we have done for him as an outpatient is restart  his Demadex.  While he was in the hospital on Apr 29, 2004, his hemoglobin  dropped significantly during that hospitalization, requiring transfusion of  2 units  of packed red blood cells to bring his hemoglobin up to around 10.  It is now stable.  He was seen in consultation by Dr. Gerarda Fraction. Jones.  He feels that the patient probably has anemia of chronic disease and has  recommended injections of Aranesp weekly.  He was to get another one  tomorrow at the cancer center.  He is a longstanding diabetic, currently  insulin-dependent on Humalog mixed insulin.  He has a longstanding history  of hypertension since the age of 77.  He had coronary artery bypass graft  surgery in 2003 here at [facility] by Dr. Kathlee Nations Jones.  He has  done well since his surgery from a cardiac standpoint, but has never quite  regained the energy and affect of his former self.  At one point, physicians  in [place] though he was depressed and started him on Lexapro.  I have  begun to taper that.  We did do an MRI of his brain while he was in the  hospital last week and it showed a considerable amount of atrophy but no  evidence of multi-infarcts, etc.   He says Accu-Cheks have been running high at night.  While he was in the  hospital recently, a dietitian was instructed to come by and talk with the  patient and his wife about appropriate diet including a renal failure-type  diet  suited for diabetics with low sodium.  We had made arrangements for the  patient to be followed up with Dr. Aram Beecham B. Eliott Jones in June.  This was  thought to be suitable, since he had made significant progress just with  hydration and catheter placement.  However, I spoke with one of Dr. Elza Jones  colleagues in the office today and he advised readmitting the patient, based  on the information I gave him.  He felt that it would be the best way to  assess the patient's volume status and kidney situation.  It seems that his  creatinine clearance which was done when he was in the hospital recently may  be less than we actually thought for someone with a creatinine of 4.1.  He  may be looking at dialysis sooner than later.  Also, I am concerned he may  be developing an early dementia.  We have done B12 and folate levels which  proved to be normal.  His TSH is normal.  Wife has noted some odd instances  where at one point he could not work the car door properly and had some  trouble paying bills on a computer recently since being discharged.  They  also thought he had been more forgetful prior to his recent hospitalization.  This is unlike him.  Wife has asked about a neurology consultation and we  certainly will consider that in the near future, but right now, we need to  get his kidney situation assessed more thoroughly.  He did have an MRI while  in the hospital recently to rule out renal artery stenosis and that study  was negative.  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  His workup during his  last hospitalization was quite extensive, however, despite this, renal  functions have worsened once again.   CURRENT MEDICATIONS:  Currently, his medications include:  1. Flomax 0.4 mg daily.  2. Norvasc 10 mg daily.  3. Toprol 100 mg daily.  4. Minoxidil 2.5 mg b.i.d.  5. Catapres-TTS-3 patch to skin q.wk. 6. Imdur 60 mg  daily.  7. Aspirin 325 mg daily.  8. Humalog insulin 30 units subcu q.a.m. and 27 units at bedtime.  9. He is also on Protonix 40 mg daily for duodenitis which was discovered     during his most recent hospitalization by [facility].  10.      He also had some nausea and vomiting during that hospitalization     and was started on Reglan 10 mg p.o. 1/2 hour a.c. and h.s.  The nausea     has improved.  He has not had any vomiting as an outpatient but he still     occasionally has felt nauseated, requiring a Phenergan 25 mg tablet.  He     feels like he has not made a significant amount of progress since he has     been discharged home.   PAST HISTORY:  For past medical history/family history/social history,  please see previous dictated history and physical dated Apr 29, 2004, done by  myself.   OBJECTIVE:  VITAL SIGNS:  Temperature in the office is 98 degrees.  Weight  189 pounds.  Blood pressure lying is 130/60 with pulse of 84; blood pressure  standing is 128/56 with pulse of 88.  Therefore, he has no significant  orthostasis.  We have spoken with Dr. Dyke Maes, who will see the  patient in consultation.  SKIN:  His skin is warm and dry.  NODES:  None.  HEENT:  Head is normocephalic, atraumatic.  Sclerae and conjunctivae are  clear.  TMs and pharynx are clear.  NECK:  Neck is supple.  No carotid bruits, no JVD.  CHEST:  Clear.  CARDIAC:  Exam is normal.  ABDOMEN:  No organomegaly, masses or significant tenderness.  EXTREMITIES:  Extremities without edema.  NEUROLOGIC:  No gross focal deficits on brief exam.   IMPRESSION:  1. Recurrent acute renal failure, despite having a Foley catheter in place     for bladder outlet obstruction.  2. Chronic renal failure with baseline creatinine 2.2.  3. Insulin-dependent diabetes mellitus.  4. Hypertension.  5. History of coronary artery disease.   PLAN:  The patient will be readmitted and will be given normal saline at 50   mL an hour.  Nephrologist will help [place] try to establish what process is  exactly going on in this patient.  We will need to reconsult urologist, I  suspect.                                                [name], M.D.    MJB/MEDQ  D:  05/09/2004  T:  05/10/2004  Job:  [id number]   cc:   [name], M.D.  [address]  [place]  [place] [address]  Fax: [phone]   Rozanna Boer., M.D.  [address]  [place]  [place] [address]  Fax: [phone]   Charlton Haws, M.D.   Samul Dada, M.D.  [address] [facility]  [place]  [place] [address]  Fax: [phone]   Wilhemina Bonito. Marina Goodell, M.D. [facility]

## 2011-05-12 NOTE — Discharge Summary (Signed)
[name], [name]                  ACCOUNT NO.:  [id number]   MEDICAL RECORD NO.:  [id number]          PATIENT TYPE:  INP   LOCATION:  6731                         FACILITY:  [facility]   PHYSICIAN:  Hettie Holstein, D.O.    DATE OF BIRTH:  12-29-1932   DATE OF ADMISSION:  02/14/2006  DATE OF DISCHARGE:  02/19/2006                                 DISCHARGE SUMMARY   PRIMARY CARE PHYSICIAN:  [name]. [name], M.D.   REASON FOR ADMISSION:  Nausea, vomiting, and weakness as well as fever of  102.7.   PRINCIPAL DIAGNOSES:  1.  Streptococcus pyogenes bacteremia, felt to be secondary to right lower      extremity cellulitis with left manifestation.  2.  Subclinical radiographic pneumonia.   ADDITIONAL DIAGNOSES:  1.  Obstructive sleep apnea, on chronic CPAP.  2.  Parkinson's disease, diagnosed in May, 2005.  3.  Chronic renal insufficiency.  4.  History of coronary artery disease.  5.  Insulin-dependent diabetes mellitus.  6.  Anemia of chronic disease.  7.  Hypertriglyceridemia, status post coronary artery bypass graft x3 in      2003.   DISCHARGE MEDICATIONS:  Patient is to continue Augmentin 875 mg p.o. t.i.d.  x2 weeks.  He should continue his home medications of Norvasc 10 mg daily,  as before.  Continue his Lexapro and Flomax as well as Minoxidil and iron  tablets, Imdur, Lipitor, Lasix, aspirin, Toprol, Darvocet, Phenergan,  Catapres, Humalog, Sinemet, Tylenol, calcium, all as before.  Lantus, he is  to continue to increase his dose from 9 units to 15 units q.h.s. and  continue checking his sugars as before.   DISPOSITION:  [name] is to follow up with his primary care physician, Dr.  Lenord Jones, at 2 p.m. on Thursday to reassess his right lower extremity for  improvement.  He did have some erythema on the anterolateral aspect of his  right lower extremity, extending from the mid portion of his tibial mid  shaft to about 4 cm above and below and some minimal erythema and  tenderness.   He is afebrile at the time of discharge and felt to be stable on his current  antibiotic regimen, to continue for two weeks.   HISTORY OF PRESENT ILLNESS:  For full details, please refer to H&P as  dictated by Dr. Michaelyn Jones; however, [name] is a 77 year old male  with a past medical history for Parkinson's disease, coronary artery  disease, status post coronary artery bypass, chronic renal insufficiency.  He had nausea, vomiting, and diarrhea.  A temperature in the emergency  department of 102.4.  He underwent radiographic evaluation.  It is felt that  he may have a pneumonia.   HOSPITAL COURSE:  The patient was admitted and treated with Rocephin and  Zithromax with defervescence of his fevers and resolution of his nausea,  vomiting, and diarrhea.  On a day close to his discharge, he was noted to  have a reddened right lower extremity, thought to be a late manifestation of  cellulitis due to strep pyogenes, which was  discovered on one of his blood  cultures.  This information was reviewed with infectious disease, and it was  felt that this was consistent with cellulitis due to his strep pyogenes and  a classically late manifestation.  His CBGs have been elevated but have  responded to an increase in his Lantus prior to discharge.  He is felt to be  medically stable with close followup.  He is to follow up, as noted above,  with Dr. Lenord Jones.   LABORATORY DATA:  At the time of discharge, his sodium was 136, potassium  3.7, BUN 56, creatinine 2.2.  Hemoglobin 12.8, MCV 86.   His chest radiograph revealed cardiomegaly with questionable right lower  lobe density, possibly pneumonia.      Hettie Holstein, D.O.  Electronically Signed     ESS/MEDQ  D:  02/19/2006  T:  02/19/2006  Job:  [id number]   cc:   [name]. [name], M.D.  Fax: [phone]

## 2011-05-12 NOTE — H&P (Signed)
[name], [name]                            ACCOUNT NO.:  [id number]   MEDICAL RECORD NO.:  [id number]                   PATIENT TYPE:  INP   LOCATION:  4712                                 FACILITY:  [facility]   PHYSICIAN:  Luanna Cole. Lenord Fellers, M.D.                DATE OF BIRTH:  1933-11-02   DATE OF ADMISSION:  04/29/2004  DATE OF DISCHARGE:                                HISTORY & PHYSICAL   CHIEF COMPLAINT:  Weakness and vomiting.   HISTORY OF PRESENT ILLNESS:  Very pleasant 77 year old white male from [place], [place], has had generalized weakness and malaise since being  hospitalized, mid-February 2005, at [facility] in  [place].  The patient has a longstanding history of diabetes and is now  insulin dependent.  He says he has been a diabetic for over 20 years.  He  also has a history of hypertension since the age of 54.  The patient has a  history of coronary artery disease and had an angioplasty by Dr. Charlies Constable around 1989, some 15 or 16 years ago.  In October of 2003, he had  three-vessel coronary artery bypass graft surgery by Dr. Kathlee Nations Trigt  here at [facility].  He previously has been followed by physicians at  [facility]; however, he presented to my office for the first time in  late April for evaluation of the generalized weakness, malaise and fatigue.  He used to walk 2 miles a day and now cannot do that.  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  He used to enjoy going out  and working in his shop at home, but has not felt like that lately either.  There have been intermittent episodes of nausea and vomiting.  On Apr 25, 2004, he presented to the emergency department after an episode at work of  sudden spontaneous vomiting apparently followed by syncopal or a near-  syncopal episode.  The emergency department physician thought the  patient  had had a vagal reaction.  MI was ruled out.  His BUN was elevated at 71 and  creatinine was noted to be 2.7.  He was given IV fluid hydration and  discharged home.  He has a longstanding history of chronic normocytic anemia  dating back to August 2003, based on old record review.  He has been treated  with iron supplementation but anemia has not improved.  In February 2005,  when he was hospitalized with palpitations and high blood pressure, he was  anemic with hemoglobin being 8.9 g and was subsequently transfused,  according to his discharge summary.  He was changed from Benicar HCT to  lisinopril in February 2005.  The dose was initially 20 mg daily and was  increased to 40  mg daily in late February.  I do not think he has had any  renal functions since that time, until he presented to my office for new-  patient evaluation, April 19, 2004; at that time, we noted that his  creatinine had worsened from baseline of 2.2 to 3.0.  Patient notes  decreased appetite.  Denies any particular pain.  We did check a lot of lab  studies on April 19, 2004 including a sed rate of 23, iron level of 105,  iron binding capacity of 279.  CBC showed a white blood cell count of 4600,  hemoglobin was 9.5 g, MCV 90.7; chloride 104, CO2 20.  Calcium was 9.4.  Lipid panel was completely within normal limits on Lipitor 20 mg daily.  LDL  was 67.  Liver profile was normal.  TSH was normal at 1.558.  B12 level was  normal at 363 and folate level was greater than 20.  Hemoglobin A1c was 8.1%  and PSA was 0.49. ANA was negative.  SPEP was normal.  The patient had a  normal urinalysis in my office with a specific gravity of 1.025, pH of 5, no  LE or nitrite noted, no glucose or protein noted.   In the office today, blood pressure was 142/54, lying, with pulse of 66;  standing blood pressure was 136/50 with pulse of 72; he was afebrile.  Weight was 190 pounds and previously on April 19, 2004, was also 190  pounds.  He brought back 3 Hemoccult cards today, all of which were negative.  He had  an episode of vomiting this morning prior to coming to the office around  midday.  We got a STAT BMET and his sodium was 135, potassium 6.2, glucose  146, chloride 103, bicarbonate 23, BUN 100 and creatinine 4.6, which is a  marked increase from Apr 25, 2004.  Calcium was 9.8.  He is now admitted for  further evaluation.   MEDICATIONS:  1. He is on Humalog mix, 30 units subcu q.a.m. and 27 units at bedtime.  2. He has been on Lexapro 10 mg daily, but I have decreased it to 1/2     tablet, i.e., 5 mg daily as of April 19, 2004 to see if his energy level     would improve.   Other medications include:  1. Norvasc 10 mg daily.  2. Toprol 100 mg daily.  3. Minoxidil 2.5 mg b.i.d.  4. Catapres-TTS-3 q.wk. to skin.  5. Demadex 20 mg daily.  6. Imdur 60 mg daily.  7. Iron sulfate 325 mg daily.  8. Aspirin 325 mg daily.  9. P.r.n. sublingual nitroglycerin.   ADDITIONAL PAST MEDICAL HISTORY:  1. Cataract extraction of the right eye in 2003 and of the left eye in 2004.  2. He had a fractured right wrist in 1995.  3. Bypass surgery in October 2003.   ALLERGIES:  Says he is intolerant of PROCARDIA; it causes his ankles to  swell.   HEALTH MAINTENANCE:  Had tetanus immunization, April 19, 2004, and a  Pneumovax immunization in the year 2000.   PAST SURGICAL HISTORY:  He has had a total of 4 colonoscopies, 2 of which  were done at [facility] in [facility] by Dr. Orma Flaming, his third  colonoscopy was done at [facility] approximately 1997 and his last  one was done in April 2003 at [facility] or [facility].  At that time, old records say he  had hyperplastic polyps.   SOCIAL HISTORY:  He is married.  His wife is retired from the [employer].  He has 2 children, a daughter, age 73, who has had breast cancer in 1993 and has a history of  rapid pulse, a son, age 65, in good  health with the exception of hyperlipidemia.  The patient does not smoke or  consume alcohol.  He has a Holiday representative.   FAMILY HISTORY:  Father died at age 82 of lung cancer.  Mother died at age  25 of a stroke.  He has 2 brothers, 1, age 52, with hypertension, another  brother, age 67, with diabetes and hypertension; 1 sister, age 36, with  history of thyroid cancer and breast cancer, another sister, age 45, with  hypertension.   REVIEW OF SYSTEMS:  Apparently has a history of decreased testosterone level  and has been treated with AndroGel in the past.  Complains of generalized  weakness, particularly in his legs, occasional chest tightness, difficulty  with erections, easy bruisability, chronic fatigue.   PHYSICAL EXAM:  SKIN:  Skin is warm and dry.  NODES:  None.  HEENT:  Head is normocephalic, atraumatic.  TMs and pharynx are clear.  NECK:  Neck is supple.  No JVD, thyromegaly or carotid bruits.  CHEST:  Chest clear to auscultation.  CARDIAC:  Regular rate and rhythm, normal S1 and S2.  ABDOMEN:  Bowel sounds active.  No hepatosplenomegaly, masses or tenderness.  GU:  Prostate without nodules.  EXTREMITIES:  Trace edema.  NEUROLOGIC:  No focal deficits, however, his family says that his alertness  and memory have not been quite as good of late.   IMPRESSION:  1. Acute renal failure possibly secondary to volume depletion; he has not     been eating and drinking well this entire week.  2. Chronic renal failure.  3. Insulin-dependent diabetes mellitus.  4. Coronary artery disease, status post coronary artery bypass graft x3,     October 2003.  5. Chronic normocytic anemia.  6. Prior history of hypertriglyceridemia treated with Lipitor.   PLAN:  The patient will be admitted and will be given gentle hydration with  normal saline starting initially at 50 mL an hour.  He will have a renal  ultrasound done STAT.  He will be given p.o.  Kayexalate with repeat  potassium being drawn 2 hours after this is given.  I might add that his  renal ultrasound in February of 2005 at [facility] was normal.  We  are going to hold his lisinopril and Demadex for now.                                                Luanna Cole. Lenord Fellers, M.D.    MJB/MEDQ  D:  04/29/2004  T:  04/30/2004  Job:  [id number]   cc:   Fayrene Fearing L. Deterding, M.D.  [address]  [place]  [place] [address]  Fax: [phone]   [facility]

## 2011-05-12 NOTE — Consult Note (Signed)
NAMEMarland Kitchen  [name], [name]                            ACCOUNT NO.:  [id number]   MEDICAL RECORD NO.:  [id number]                   PATIENT TYPE:  INP   LOCATION:  4712                                 FACILITY:  [facility]   PHYSICIAN:  Rozanna Boer., M.D.      DATE OF BIRTH:  July 01, 1933   DATE OF CONSULTATION:  05/01/2004  DATE OF DISCHARGE:                                   CONSULTATION   REASON FOR CONSULTATION:  Urinary retention with silent prostatism.   BRIEF HISTORY:  This 77 year old patient from [place], [place] was  admitted on Apr 29, 2004 with weakness and vomiting and elevated LFTs.  He is  a long-standing diabetic, now insulin dependent.  He has been diabetic for  at least 20 years by history.  Also, he has had hypertension since age 71.  He has had coronary artery disease, had angioplasty by Charlies Constable in 1989,  and in October 2003 he had three vessel bypass graft at [facility].  He had  been previously followed by the physicians at the __________  clinic.  He  was admitted in February of this year with weakness and fatigue but no  etiology was found.  He saw Dr. Lenord Fellers for the first time in late-April for  generalized weakness, malaise, and fatigue.  His creatinine was 2.7, BUN was  71.  He had a visit to the emergency room Apr 25, 2004, with an episode at  work with spontaneous vomiting and an MI was ruled out.  He thought he had a  vagovagal reaction.  This admission his creatinine had risen to 4.6.  A  renal consultation was obtained and a Foley catheter was put in his bladder.  This had over 900 cc and, over the next 24 hours his creatinine went from  4.6 down to 2.0.  He specifically denies getting up much at night,  occasionally some nights he does not get up at all.  Has no frequency,  urgency, no hesitancy to speak of.  He says in the last 6-12 months his  stream has been fairly stable and normal.  He has never had a urinary tract  infection nor has he  had hematuria.  He has no family history of prostate  cancer.   MEDICATIONS:  1. Humalog mix 30 units subcu q.a.m. and 27 units at bedtime.  2. Lexapro 5 mg daily.  3. Norvasc 10 mg daily.  4. Toprol 100 mg daily.  5. Minoxidil 2.5 mg b.i.d.  6. Catapres three times a week to skin.  7. Demadex 20 mg daily.  8. Imdur 60 mg daily.  9. Iron.  10.      Aspirin.  11.      Nitroglycerin p.r.n.   Other surgeries include cataract to right eye in 2003, left eye in 2004; and  fractured wrist in 1995, and a bypass surgery in October 2003.   ALLERGIES:  He  is intolerant to [facility] causing ankle swelling.   REVIEW OF SYSTEMS:  He has had numerous colonoscopies, the last was done in  April 2003.  No irritable bowel, no constipation, no melena.  Does not  smoke, does not drink alcohol.  Breathing is normal.   SOCIAL HISTORY:  He is married and has a 12th grade education.  He has a  daughter, 56, who has had breast cancer in 66; a son, 20.  Father died at  40 with lung cancer.  Mother died at age 39 of a stroke.  He has two  brothers, one 40 with hypertension; another brother 23 with diabetes and  hypertension; one sister, 75 with history of thyroid cancer, breast cancer;  another sister, 19, with hypertension.   PHYSICAL EXAMINATION:  VITAL SIGNS:  Temperature 98.8; blood pressure  124/50; pulse 90; respirations 18.  GENERAL:  Pleasant, white male lying quietly in bed.  No acute distress.  HEENT:  Clear.  NECK:  Supple.  Oropharynx is clear.  CHEST:  Clear.  ABDOMEN:  Soft.  Liver/spleen nonpalpable.  Bladder is not distended.  GU:  Penis normal with a Foley catheter in place.  Bilateral descended  testes.  Epididymis nontender.  His prostate is moderately enlarged, not  fixed or indurated.  SV is not palpated.  EXTREMITIES:  Negative.  No edema.  Good distal pulses intact to sensation  and light touch.   IMPRESSION:  1. Acute renal failure secondary to silent prostatism.  2.  Urinary retention.  3. Type 1 diabetes.  4. Coronary artery disease post bypass graft in 2003.   RECOMMENDATIONS:  Leave Foley catheter in for now for his bladder to be  compressed.  Put him on Flomax 0.4 mg a day and I will see him in a week or  so in the office for a voiding trial.  Other evaluation needs to be done,  including cysto and other evaluation.  It may be that he just has a  neurogenic bladder from his diabetes superimposed on some mild obstructive  uropathy but, thankfully, his renal function tests are improving with just  Foley catheter alone.                                               Rozanna Boer., M.D.    HMK/MEDQ  D:  05/01/2004  T:  05/02/2004  Job:  [id number]

## 2011-05-12 NOTE — Discharge Summary (Signed)
[name], [name]                            ACCOUNT NO.:  [id number]   MEDICAL RECORD NO.:  [id number]                   PATIENT TYPE:  INP   LOCATION:  5705                                 FACILITY:  [facility]   PHYSICIAN:  [name], M.D.                DATE OF BIRTH:  Apr 18, 1933   DATE OF ADMISSION:  05/09/2004  DATE OF DISCHARGE:  05/12/2004                                 DISCHARGE SUMMARY   DISCHARGE DIAGNOSES:  1. Renal insufficiency secondary to hypertension and diabetes mellitus.  2. Coronary artery disease.  3. Insulin-dependent diabetes mellitus.  4. Hypertension.  5. Silent prostatism with obstructive symptoms causing renal failure.  6. Anemia of chronic disease.  7. Fatigue.   BRIEF HISTORY:  This pleasant 77 year old white male from [place], [place], was admitted Apr 29, 2004 through May 04, 2004 with hyperkalemia,  malaise, and fatigue.  He has a longstanding history of insulin-dependent  diabetes.  Has been diabetic for over 20 years.  History of hypertension  since the age of 53.  Has a history of coronary artery disease and is status  post an angioplasty by Dr. Charlies Constable around 1989.  In October 2003, he  had three-vessel coronary artery bypass graft surgery by Dr. Kathlee Nations Trigt  here at [facility].  Until recently has been followed by physicians at  [facility] in [place].  He presented to my office for the first time  in late April 2005 for evaluation of generalized weakness, malaise, and  fatigue.  He used to walk 2 miles a day and now cannot do that.  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  Basically this job  consists of sitting at a desk.  He was seen in the emergency department Apr 25, 2004 after an episode at work of sudden spontaneous vomiting apparently  followed by syncope or near syncope.  He was seen by the emergency  department physician and thought he had had a vagal reaction.  MI was ruled  out.   BUN was elevated at 71 and creatinine was noted to be 2.7.  He was  given IV fluid hydration and discharged home.  He has a longstanding history  of chronic normocytic anemia dating back to August 2003 based on old record  review.  He has been on iron supplementation but his anemia has not improved  at all.  In February 2005 he was hospitalized in [place] with  palpitations and high blood pressure and was actually given a transfusion at  that time.  At that time, he was also changed from Benicar HCT to  lisinopril.  On April 19, 2004, we noted that his creatinine had worsened  from a baseline of 2.2 to 3.  He has a normal TSH and a normal calcium, he  has been on Lipitor.  Liver  profile was normal.  B12 level has been checked  and is normal as well as folate.  Hemoglobin A1C is 8.1% and PSA is 0.49.  He was admitted Apr 29, 2004 after evaluation in my office at which time he  was found to have a sodium of 135, potassium was 6.2, chloride of 103, BUN  of 100, and creatinine of 4.6.  He was admitted for further evaluation of  renal failure and hyperkalemia.  Of note, he is on Humalog Mix 30 units  subcu q.a.m. and 27 units at bedtime, Norvasc 10 mg daily, Toprol 100 mg  daily, minoxidil 2.5 mg b.i.d., Catapres-TTS-3 weekly to skin, Demadex 20 mg  daily, Imdur 60 mg daily, iron sulfate 325 mg daily, aspirin 325 mg daily,  p.r.n. sublingual nitroglycerin.  For Past Medical History, etc., and  details of physical exam please see the dictated History and Physical.   HOSPITAL COURSE:  The patient was admitted to telemetry unit and was given  emergently Kayexalate 30 grams and 20% sorbitol p.o.  Serum potassium was  repeated stat 2 hours after giving this medication.  He was given Phenergan  for nausea.  He was given IV fluids consisting of normal saline at 500 mL an  hour.  Renal consultation was requested.  Renal consultant suggested  ultrasound of the kidneys.  There was evidence of  obstructive uropathy and  urology consultation was requested.  A Foley catheter was placed to relieve  the obstruction.  However, he denies symptoms of prostatism whatsoever.  Renal consultant increased IV fluids to 100 mL per hour.  We asked that  nutrition see the patient about a renal failure diet.  Urologist placed the  patient on 0.4 mg daily.  Wife reported the patient had had some  intermittent confusion and associated with the vomiting.  I wanted to do an  MRI of the brain to rule out CNS lesions.  MRI of the brain showed some  atrophy but no acute strokes or tumor.  The patient had carotid Dopplers  that showed 60 to 80% internal carotid artery stenosis on the right and on  the left there is a 40 to 60% internal carotid artery stenosis.  Because of  his cardiac condition and his hemoglobin of 7.8 g on May 02, 2004, he was  transfused 2 units of packed red blood cells.  Hemoglobin improved to 9.9 g  and eventually to 11.3 g.  Anemia studies were negative.  He was placed on  Protonix 40 mg daily.  He was seen by gastroenterologist Dr. Yancey Flemings for  endoscopy to rule out GI bleed with history of chronic anemia.  Haptoglobin  was within normal limits.  Serum protein electrophoresis was normal as well.  2-D echocardiogram showed normal left ventricular size, ejection fraction  estimated to be 55-65%, mild mitral valve regurgitation, left atrium mildly  dilated.  Also the patient was seen by hematologist Dr. Arline Asp who felt  like the patient had anemia of chronic disease and agreed to follow the  patient up in his office.  Urine-free eosinophils was negative.  The patient  was seen by [facility].  We wanted to set up followup here in  [place] once again since Dr. Juanda Chance had seen him previously.  This will  be arranged as an outpatient.  Renal service felt that the patient's  problems were mostly due to his silent prostatism although I had some doubts about that.  In any  case, he improved within  a short period of time just  with IV fluid hydration and relief of obstruction.  However, it is  recognized that he has multiple medical problems and is walking a fine line  trying to juggle all of these medical problems.  It presents quite a  challenge to the patient and to his doctors.  At the time of discharge, the  patient had a sodium of 143, a potassium of 3.8, cholesterol of 112, CO2 of  25, BUN of 40 and creatinine of 2.2.  Calcium was 9.  He did have initially  on admission some mild elevation of SGOT at 49.  This subsequently will be  followed up as an outpatient.  His TSH is normal and his free T4 is normal.  Complement studies are negative.  Intact parathyroid hormone is 64.3.  Iron  level is 79, total iron-binding capacity is low at 199.  He did have a 24-  hour for creatinine clearance, total volume 2500 mL in 24 hours.  Urine  creatinine was 90.2 mg/dL, urine protein mg/dL, creatinine timed urine is  2255 mg/24 hours, urine protein electrophoresis shows free kappa light  chains 11.8.  This is a polyclonal increase in free kappa light chains.  No  monoclonal free light chains noted or Bence Jones proteins detected.  Urinalysis showed specific gravity of 1.017, pH of 5, with 0-2 white blood  cells, 0-2 red blood cells, 30 mg/dL protein, negative nitrite.  The  patient's blood type is A positive.  Helicobacter biopsy is negative.  Endoscopy done by Dr. Yancey Flemings showed gastritis without hemorrhage and  duodenitis without hemorrhage.  Subsequently he was maintained on Protonix  and for motility was given Reglan 10 mg a.c.  The patient was discharged  home in improved condition on May 12, 2004 on the following medications:  Phenergan tablets 25 mg q.4 h. p.r.n. nausea, Flomax 0.4 mg daily, Norvasc  10 mg daily, Toprol 100 mg daily, minoxidil 2.5 mg b.i.d., Catapress-TTS-3  to skin weekly, Imdur 60 mg daily, aspirin 325 mg daily, nitroglycerin  p.r.n.  chest pain sublingually 1/150 gr, Lexapro 10 mg 1/2 tablet every  other day  for 2 weeks increasing to 1/2 tablet daily after 2 weeks.  It is  thought he may have an element of depression with all this chronic illness.  Humalog 30 units subcu q.a.m. and 27 units subcu at bedtime, Aranesp 100 mcg  weekly per Dr. Johney Frame for chronic anemia, Protonix 40 mg daily for  gastritis and duodenitis, Reglan 10 mg before meals and at bedtime for  gastric motility.  The patient was advised not to work until rechecked in my  office on Friday, May 13th.  He also is going home with a Foley catheter and  Dr. Aldean Ast has left instructions that the patient is to remove that  catheter the day before seeing him one week after discharge.  In the  foreseeable future, we will arrange outpatient follow up with renal  consultants and with the cardiologist.                                                [name], M.D.    MJB/MEDQ  D:  07/14/2004  T:  07/15/2004  Job:  [id number]

## 2011-05-12 NOTE — Assessment & Plan Note (Signed)
[facility]                              CARDIOLOGY OFFICE NOTE   NAME:ALLENJt, Brabec                         MRN:          [id number]  DATE:07/27/2006                            DOB:          Mar 29, 1933    Mr. Dickerman returns today for followup. He is doing fairly well. His cardiac  status is stable. He is status post bypass with a nonischemic Myoview last  year. He denies any significant chest pain. He has multiple other medical  problems including diabetes, Parkinson's disease and chronic renal failure.  Despite all this, he seems to be doing fairly well. He and his wife are  going out to [facility] next week.   They have 2 older children and 3 or 4 older grandchildren who live in the  area.   PHYSICAL EXAMINATION:  GENERAL:  He does have a bit of a masked faces, his  wrists are a bit stiff.  VITAL SIGNS:  His blood pressure is 130/70, his pulse is 70 and regular.  LUNGS:  Clear.  HEART:  S1 and S2 with no abnormal heart sounds.  ABDOMEN:  Benign.  EXTREMITIES:  Lower extremities intact pulses, no edema.   IMPRESSION:  Stable status post coronary artery bypass graft, good left  ventricular function, no evidence of chest pain, reasonable risk factor  modification on multiple medications for his blood pressure which is now  well controlled. I will see him back in 6 months.   I suspect as he gets older his other medical problems will rise to the  forefront and his cardiac issues will be secondary.                               Noralyn Pick. Eden Emms, MD, [facility]    PCN/MedQ  DD:  07/27/2006  DT:  07/27/2006  Job #:  [id number]

## 2011-05-12 NOTE — Op Note (Signed)
NAMEGLYNN, YEPES NO.:  [id number]   MEDICAL RECORD NO.:  [id number]                   PATIENT TYPE:  INP   LOCATION:  2305                                 FACILITY:  [facility]   PHYSICIAN:  Kerin Perna III, M.D.           DATE OF BIRTH:  November 12, 1933   DATE OF PROCEDURE:  09/25/2002  DATE OF DISCHARGE:                                 OPERATIVE REPORT   PREOPERATIVE DIAGNOSIS:  Class III exertional angina with severe two-vessel  coronary artery disease.   POSTOPERATIVE DIAGNOSIS:  Class III exertional angina with severe two-vessel  coronary artery disease.   PROCEDURE:  Coronary artery bypass grafting x3 (left internal mammary artery  to left anterior descending coronary artery, saphenous vein graft to  diagonal, saphenous vein graft to circumflex-posterior descending).   SURGEON:  Kerin Perna, M.D.   ASSISTANT:  Lissa Merlin, P.A.   ANESTHESIA:  General by Cliffton Asters Ivin Booty, M.D.   INDICATIONS:  The patient is a 77 year old diabetic who has had exertional  chest pain.  Cardiac catheterization by Dr. Arnoldo Hooker demonstrated  severe LAD diagonal stenosis and stenosis of the dominant posterior  descending branch of the circumflex.  He is felt to be a surgical candidate  for coronary revascularization.  Prior to the surgery I examined the patient  in my office and reviewed the results of the cardiac catheterization with  the patient and family.  I discussed the indications and benefits of  coronary bypass surgery with the patient and family.  I reviewed the  alternatives to surgical therapy for treatment of his coronary artery  disease.  I discussed the major aspects of the proposed operation, including  location of the surgical incisions, the use of general anesthesia and  cardiopulmonary bypass, and the expected postoperative hospital recovery  period.  I discussed with the patient risks to him of coronary artery bypass  graft surgery,  including risks of MI, CVA, bleeding, infection, blood  transfusion requirement, and death.  We discussed his preoperative pre-  existing medical problems including diabetes, mild to moderate renal  insufficiency, and anemia, and his postoperative recovery and potential for  complication.  He understood these implications for the surgery and agreed  to proceed with the operation as planned under what I felt was an informed  consent.   OPERATIVE FINDINGS:  The coronaries were diffusely diseased and heavily  calcified secondary to his diabetic vascular disease.  The endoscopic vein  was harvested from both thighs.  The right thigh vein was for the most part  inadequate for use.  The left thigh vein was of better caliber and quality  and was average in quality at best.  The mammary artery was a good vessel  with excellent flow.   DESCRIPTION OF PROCEDURE:  The patient was brought to the operating room and  placed supine on the operating room table, where  general anesthesia was  induced under invasive hemodynamic monitoring.  The chest, abdomen, and legs  were prepped with Betadine and draped as a sterile field.  A sternal  incision was made and the left internal mammary artery was harvested as a  pedicle graft from its origin at the subclavian vessels.  Endoscopic vein  harvest was performed on both upper legs.  The right thigh vein was small,  sclerotic, and for the most part not usable.  The sternal retractor was  placed and the pericardium was opened.  The heparin was administered, and  the ACT was documented as being therapeutic.  Through pursestrings placed in  the ascending aorta and right atrium, the patient was cannulated, placed on  bypass, and cooled to 32 degrees.  The coronaries were identified for  grafting, and the mammary artery and vein grafts were prepared for the  distal anastomoses.  A cardioplegia cannula was placed in the ascending  aorta.  As the aortic crossclamp was  applied, 500 cc of cold blood  cardioplegia was delivered into the aortic root with immediate cardioplegic  arrest and septal temperature dropping to less than 12 degrees.  Topical  iced saline slush was used to augment myocardial preservation, and a  pericardial insulator pad was used to protect the left phrenic nerve.   The distal coronary anastomoses were then performed.  The first distal  anastomosis was to the diagonal.  This had diffuse, severe calcific disease.  Beyond the calcification, a reversed saphenous vein was sewn end-to-side  with a running 7-0 Prolene with good flow through the graft.  The second  distal anastomosis was of the posterior descending branch of the dominant  circumflex.  This was a 1.8 mm vessel with proximal 80% stenosis.  A  reversed saphenous vein was sewn end-to-side with running 7-0 Prolene with  good flow through the graft.  Cardioplegia was redosed.  The third distal  anastomosis was to the distal LAD.  This had diffuse, severe calcified  disease.  The left internal mammary artery pedicle was brought through an  opening created in the left lateral pericardium and was brought down onto  the LAD and sewn end-to-side with a running 8-0 Prolene.  There was  excellent flow through the anastomosis with immediate rise in septal  temperature after release of the pedicle clamp on the mammary artery.  The  mammary pedicle was secured to the epicardium and the aortic crossclamp was  removed.   The heart resumed a spontaneous rhythm.  Using a proximal  occluding clamp,  two proximal vein anastomoses were placed on the ascending aorta using a 4.0  mm punch and running 6-0 Prolene.  The partial clamp was removed and the  vein grafts were perfused.  Each had excellent flow, and hemostasis was  documented at the proximal and distal anastomoses.  The patient was rewarmed  and reperfused.  The heart was beating vigorously.  The lungs were re- expanded and the  ventilator was resumed.  When the patient reached 37  degrees and after temporary pacing wires had been applied, the patient was  weaned from bypass without difficulty with stable blood pressure and cardiac  output.  Protamine was administered.  There was no adverse reaction to  protamine.  The cannulas were removed.  The mediastinum was irrigated with  warm antibiotic irrigation.  The leg incisions were irrigated and closed in  the standard fashion.  The superior pericardial fat was closed over the  aorta and  vein grafts.  Two mediastinal and a left pleural chest tube were  placed and brought out through a separate incision.  The sternum was  reapproximated with interrupted steel wire.  The pectoralis fascia was  closed with interrupted #1 Vicryl.  The subcutaneous and skin were closed  with a running Vicryl and sterile dressings were applied.  The patient  returned to the ICU in stable condition.  During the operation the patient  received two units of packed cells from the blood bank for a hemoglobin of 7  g at the onset of the operation.                                               Mikey Bussing, M.D.    PV/MEDQ  D:  09/25/2002  T:  09/26/2002  Job:  [id number]   cc:   Nicholes Rough, [place] Arnoldo Hooker, MD, [facility]

## 2011-05-12 NOTE — Consult Note (Signed)
[name], [name]                            ACCOUNT NO.:  [id number]   MEDICAL RECORD NO.:  [id number]                   PATIENT TYPE:  INP   LOCATION:  4712                                 FACILITY:  [facility]   PHYSICIAN:  Carole Binning, M.D. [facility]         DATE OF BIRTH:  11-Dec-1933   DATE OF CONSULTATION:  DATE OF DISCHARGE:  05/02/2004                                   CONSULTATION   PRIMARY PHYSICIAN:  Dr. Luanna Cole. [place].   PRIMARY CARDIOLOGISTS:  1. Dr. Charlies Constable of [place].  2. Dr. Gwen Pounds of [place].   CHIEF COMPLAINT:  Weakness and fatigue.   HISTORY OF PRESENT ILLNESS:  [name] is a 77 year old patient with a known  history of coronary artery disease, who underwent angioplasty approximately  15 years ago under the care of Dr. Charlies Constable.  He has been followed by  Dr. Gwen Pounds in [place] of the [facility] since that time.  In 2003,  cardiac catheterization, which apparently occurred at [facility],  revealed severe three-vessel coronary artery disease and the patient was  transported to [facility] for coronary artery bypass grafting under the care  of Dr. Kathlee Nations Trigt.  The following grafts were performed:  LIMA to the  LAD, saphenous vein graft to the diagonal, saphenous vein graft to the  circumflex and PDA.  The patient has noticed progressive weakness since  Christmas.  He had a presyncopal spell just before Christmas, where the wife  checked a capillary blood glucose and this was low.  She gave him food and  he was better.  There were no further recurrences.  The patient does admit  to progressive weakness and fatigue since Christmas and had noted that he  was anemic in February, where he was admitted for workup at [facility].  He does have chronic normocytic anemia.  He also has chronic  renal insufficiency with a baseline creatinine of 2.2.  He was seen by Dr.  Lenord Fellers in the office on Apr 29, 2004 and a BMET revealed an  elevated  creatinine of 4.6, as well as hyperkalemia of 6.0.  Since his  hospitalization, his workup has included renal consultation as well as  urology.  It is felt that he has silent prostatism creating an acute-on-  chronic renal failure.  The patient has also been on ACE inhibitors which  have been stopped.  We are consulted to evaluate the patient for his  progressive malaise and weakness with known coronary artery disease.  The  patient denies chest pain, dyspnea, palpitations, PND or orthopnea.   The patient does have significant risk factors including coronary disease,  diabetes, hypertension, family history of peripheral vascular disease as  well as age and being male.   PAST MEDICAL HISTORY:  1. Diabetes mellitus, insulin dependent.  2. Hypertension.  3. Anemia, normocytic.  4. CAD, status post CABG, 2003.  5. Bilateral cataract  excisions in 2004.  6. Chronic renal insufficiency.  7. Hyperlipidemia.   MEDICATIONS PRIOR TO ADMISSION:  1. Norvasc 10 mg a day.  2. Toprol 100 mg a day.  3. Minoxidil 2.5 mg b.i.d.  4. Catapres-TTS-3 patch once a week.  5. Demadex 20 mg a day.  6. Imdur 60 mg a day.  7. Iron 325 mg a day.  8. Aspirin 325 mg a day.  9. Sublingual nitroglycerin p.r.n. chest pain.   CURRENT MEDICATIONS:  1. Protonix 40 mg a day.  2. Reglan 10 mg p.o. a.c./h.s.  3. Imdur 60 mg a day.  4. Baby aspirin daily.  5. Lexapro 5 mg a day.  6. Insulin.  7. Norvasc 10 mg a day.  8. Toprol-XL 100 mg a day.  9. Minoxidil 2.5 mg a day.  10.      Catapres-TTS-3 patch daily.  11.      Flomax 0.4 mg daily.   SOCIAL HISTORY:  He lives in [place] with his wife.  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  Denies any tobacco, alcohol or  illicit drug use.  Follows a diabetic diet.   FAMILY HISTORY:  Mom died at age 45 from a stroke.  Dad died at age 19 from  lung cancer.  Siblings with a history of hypertension, CVA as well as  thyroid cancer.    REVIEW OF SYSTEMS:  Review of systems as above and otherwise negative.   PHYSICAL EXAMINATION:  VITAL SIGNS:  Temperature 98.1, pulse 80,  respirations 20, blood pressure 160/58.  Fluid balance positive 500.  O2  saturation is 96% on room air.  GENERAL:  In general, in no acute distress.  HEENT:  Grossly normal.  NECK:  No carotid or subclavian bruits.  No JVD or thyromegaly.  CHEST:  Chest clear to auscultation bilaterally.  No wheezing or rhonchi.  HEART:  Regular rate and rhythm.  No gross murmur or rub.  ABDOMEN:  Good bowel sounds.  Nontender, nondistended.  No mass, no bruits.  EXTREMITIES:  No peripheral edema, clubbing or cyanosis.  No deformities of  the joints.  BACK:  No CVA tenderness.  NEUROLOGIC:  Cranial nerves II-XII grossly intact.   LABORATORY AND ACCESSORY CLINICAL DATA:  Chest x-ray revealed mild  cardiomegaly, no active disease.   Abdominal ultrasound was negative for acute abnormality.   EKG reveals normal sinus rhythm, rate 67.  There were no acute ST-T wave  changes.   Lab studies reveal mildly elevated myoglobins with negative troponins;  maximum myoglobin was 255.  Hemoglobin today was 7.8 with a hematocrit of  22.3, white count 4.0, platelets 158,000.  BUN 57, creatinine 2.2.  TSH  1.094.   ASSESSMENT AND PLAN:  1. Fatigue and malaise.  2. Acute-on-chronic renal failure.  3. Silent prostatitis with obstruction felt to be contributing to #2.  4. Diabetes mellitus, insulin-dependent.  5. Coronary artery disease, status post coronary artery bypass graft.  6. Hypertension.  7. Normocytic anemia.   At this point, we will obtain a 2-D echo to reevaluate wall motion and  ejection fraction.  If there is an abnormality with this, we will need to  compare to previous studies and we can obtain this from Dr. Gwen Pounds.  At  this point, it would not be advisable to proceed with an invasive-type procedure, unless his renal function improves significantly.  We  could  progress with a Cardiolite study, if needed, once the 2-D echo  results are  back.   The patient was seen and examined by Dr. Loraine Leriche Pulsipher.     Guy Franco, P.A. [facility]                      Carole Binning, M.D. [facility]    LB/MEDQ  D:  05/02/2004  T:  05/03/2004  Job:  [id number]   cc:   Luanna Cole. Lenord Fellers, M.D.  [address]  [place]  [place] [address]  Fax: [phone]   Carole Binning, M.D. [facility]   Charlies Constable, M.D. [facility], [place] Arnoldo Hooker M.D.

## 2011-05-12 NOTE — Consult Note (Signed)
[name], [name]                            ACCOUNT NO.:  [id number]   MEDICAL RECORD NO.:  [id number]                   PATIENT TYPE:  INP   LOCATION:  4712                                 FACILITY:  [facility]   PHYSICIAN:  Samul Dada, M.D.            DATE OF BIRTH:  [birth year]   DATE OF CONSULTATION:  05/02/2004  DATE OF DISCHARGE:                                   CONSULTATION   CONSULTING PHYSICIAN:  Samul Dada, M.D.   REFERRING PHYSICIAN:  Luanna Cole. Lenord Fellers, M.D.   REASON FOR CONSULTATION:  Anemia.   HISTORY OF PRESENT ILLNESS:  The patient is a pleasant 77 year old male who  we are asked to see in consultation for evaluation of anemia.  He presented  to his primary care physician with generalized weakness and malaise as well  as fatigue.  He was sent to the emergency department on Apr 25, 2004 after an  episode of sudden, spontaneous vomiting followed by near syncope or syncope  felt to be a vagal reaction to the vomiting.  A MI was ruled out.  He was  given IV fluids after his creatinine levels were elevated to 2.7.  Once  stable, he was discharged home.  On Apr 29, 2004 he returned to his primary  care physician with the same symptoms, this time with another episode of  vomiting.  He was admitted to [facility] for further evaluation.  On admission his hemoglobin and hematocrit were 9.9 and 27.8, respectively,  having dropped to 7.8 and 22.3 today.  In review, the patient has known  anemia felt to be secondary to chronic renal insufficiency going back at  least to August 2003 and has been placed on oral iron by his primary care  physician since that time. In February 2005, the patient received  transfusion due to this anemia for a hemoglobin of 8.9 after he was admitted  with malignant hypertension.  Of note, no transfusion since that time was  received until this admission.  He was given Aranesp on Apr 30, 2004 in hopes  of improving his count level.   PAST MEDICAL HISTORY:  1. Chronic anemia, felt to be secondary to chronic renal insufficiency     diagnosed around August 2003, on iron supplements.  2. Insulin-dependent diabetes mellitus with retinopathy.  3. Hypertension requiring hospitalization in [facility] in February     2005.  4. Coronary artery disease.  5. Chronic renal insufficiency.  6. Depression.  7. Known hyperplastic polyps with last colonoscopy report in 2003 at     [facility].  8. Low testosterone treated with AndroGel in the past.  9. Gastritis - duodenitis without hemorrhage per esophagogastroduodenoscopy.   PAST SURGICAL HISTORY:  1. Status post esophagogastroduodenoscopy on May 02, 2004 by Dr. Juanda Chance.  2. Status post PTA by Dr. Charlies Constable in 1989.  3. Status post coronary artery bypass grafting  by Dr. Donata Clay in October     2003.  4. Status post cataract surgery bilaterally.   ALLERGIES:  1. PROCARDIA causing ankle swelling.   CURRENT MEDICATIONS:  1. Protonix 40 mg daily.  2. ISMO 60 mg daily.  3. Aspirin 81 mg daily.  4. Lexapro 5 mg daily.  5. Insulin sliding scale.  6. Norvasc 10 mg daily.  7. Reglan 10 mg daily.  8. Toprol XL 100 mg daily.  9. Minoxidil 2.5 mg p.o. b.i.d.  10.      Catapres patch q.7 days.  11.      Aranesp 100 mcg subcutaneous q.7 days; last dose on Apr 30, 2004.  12.      Flomax 0.4 mg daily.  13.      Phenergan p.r.n.   REVIEW OF SYMPTOMS:  GI:  The patient denies using any PPI's before  admission.  No non-steroidal anti-inflammatories; only aspirin 325 mg on  admission and now on 81 mg.  No bright red blood per rectum.  GENERAL:  He  has no weight loss, but a decrease in appetite for the last two months.  CARDIOPULMONARY:  No shortness of breath or dyspnea on exertion.  No new  episodes of nausea or vomiting. No GI or GU bleed.  EXTREMITIES:  No calf  tenderness or dysesthesias.  No peripheral edema.   FAMILY HISTORY:  Mother died of a CVA at 83.  Father  died of lung cancer at  82.  Two sisters died with breast cancer; one had overlapping thyroid cancer  as well.  One daughter is alive with a history of breast cancer.   SOCIAL HISTORY:  The patient is married.  [place]  He has a twelfth grade education.  He is a Landscape architect, living a  sedentary lifestyle.  No tobacco or alcohol intake.   HEALTH MAINTENANCE:  He has had four colonoscopies in the past; two at  [facility], one in 1997 at [place] and the fourth one in April 2003 at  [facility].  His last tetanus shot was on April 19, 2004. His last  flu shot was in 2000.  His last stress test was in February 2005, negative.   PHYSICAL EXAMINATION:  GENERAL:  This is a 77 year old white male in no  acute distress, conversant, flat affect.  VITAL SIGNS:  Blood pressure 130/60, pulse 84, respirations 22, temperature  96.0, weight 188.3, height 6'1, pulse oximetry is 96% in room air.  HEENT:  Slight left ptosis.  No visible areas of lesions in the eyes or  face.  Oral mucosa is clear and moist.  The neck is supple.  No JVD.  No  cervical or supraclavicular masses.  CHEST:  Symmetric in excursion.  Lungs are clear to auscultation  bilaterally.  There are no axillary masses.  CARDIOVASCULAR:  Regular rate and rhythm without murmurs, rubs or gallops.  ABDOMEN:  Soft and non-tender.  Bowel sounds times four.  No palpable spleen  or liver.  GU AND RECTAL EXAMS:  Deferred.  He has a Foley catheter.  No blood is  visible from the Foley catheter.  EXTREMITIES:  No cyanosis, clubbing or edema.  SKIN:  Without purpura or petechiae.  NEUROLOGIC:  Nonfocal with the exception of spending longer periods of time  trying to find words.  There is a mild resting tremor.   LABORATORY DATA:  Hemoglobin 7.8, hematocrit 22.3 (currently, the patient is awaiting transfusion), white count  4.0, platelet count 158,000, neutrophils  4.9, MCV 89.9.  Retic count was 65.7 and  absolute 2.1%.  C4 was 21.  C3 was  88.  SPEP is pending.  Creatinine clearance is pending. A 24-hour urine  electrophoresis is pending.  PTH is pending.  New iron studies are pending.  Old iron studies from April 19, 2004 show an iron of 105, a TIBC of 279, B12  of 363, folic acid greater than 20.  ANA is negative from April 19, 2004.  SPEP was normal.  Hemoccult times three in April were negative and today are  negative.  Sodium 143, potassium 4.2, BUN 57, creatinine 2.2, glucose 71.  Total bilirubin 1.0, alkaline phosphatase 19, AST 25, ALT 49, total protein  6.7, albumin 4.1, calcium 9.7, magnesium 2.5.  TSH was 1.094 and T4 was  1.07.  A 24-hour urine protein has jus returned and is 625 mg and 24-hour  urine creatinine was 2,255.   An ultrasound of the abdomen on May 02, 2004 was negative.  Ultrasound of the  renal aorta on Apr 29, 2004 shows no hydronephrosis, a distended urinary  bladder, rule out bowel obstruction.  MRI of the brain on May 02, 2004 is  pending.  Chest x-ray on Apr 29, 2004 is negative with the exception of mild  cardiomegaly.   ASSESSMENT AND PLAN:  1. ANEMIA:  Dr. Arline Asp has seen and evaluated the patient and the chart     has been reviewed.  The peripheral smear shows no remarkable features.     He has a history that strongly suggests that his anemia is secondary to     renal insufficiency, because this has been present for at least one to     two years with a recent decompensation in renal function (BUN and     creatinine are 100 and 4.6 on Apr 29, 2004), probably accounting for the     sudden decrease in his hemoglobin and hematocrit.  No prior evidence of     GI bleeding.  Dr. Arline Asp would expect the patient to have a nice     response to ongoing therapy with Aranesp.  We will arrange after     discharge at the [facility] for outpatient follow-up.  If the     patient fails to respond satisfactorily, then he would require a bone     marrow biopsy.   We will check iron studies, SIFE, UIFE and LDH as well as     haptoglobin to rule out hemolysis.   Thank you very much for allowing [place] to participate in the care of Mr. Rollo.     Marlowe Kays, P.A.                        Samul Dada, M.D.    SW/MEDQ  D:  05/03/2004  T:  05/03/2004  Job:  [id number]   cc:   Luanna Cole. Lenord Fellers, M.D.  [address] [place]  [place]  [place] [address]  Fax: [phone]   Llana Aliment. Deterding, M.D.  [address]  [place]  [place] [address]  Fax: [phone]

## 2011-05-12 NOTE — Discharge Summary (Signed)
[name], [name]                            ACCOUNT NO.:  [id number]   MEDICAL RECORD NO.:  [id number]                   PATIENT TYPE:  INP   LOCATION:  5705                                 FACILITY:  [facility]   PHYSICIAN:  Luanna Cole. Lenord Fellers, M.D.                DATE OF BIRTH:  August 23, 1933   DATE OF ADMISSION:  05/09/2004  DATE OF DISCHARGE:  05/12/2004                                 DISCHARGE SUMMARY   DISCHARGE MEDICATIONS:  1. Flomax 0.4 mg daily.  2. Norvasc 10 mg daily.  3. Toprol XL 100 mg daily.  4. Minoxidil 2.5 mg b.i.d.  5. Catapres TTS 3 patch to skin weekly.  6. Imdur 60 mg daily.  7. Aspirin 325 mg daily.  8. Humalog insulin 30 units subcutaneously q.a.m. and 27 units at bedtime.  9. Protonix 40 mg daily.  10.      Reglan 10 mg p.o. before meals and at bedtime.  11.      Phenergan 25 mg tablets q.4h. p.r.n. nausea.  12.      Lipitor 20 mg daily.  13.      Lexapro 5 mg every other day, then discontinue after two weeks.  14.      For conjunctivitis, Neosporin eye drops in each eye q.i.d. x5 days.   DIET:  The patient is to maintain a renal failure, fat modified diet.   FOLLOWUP:  1. He is to see Dr. Lenord Fellers eight days after discharge, Thursday, May 19, 2004, at 12 noon.  2. An outpatient followup with nephrologist will be arranged.   ACTIVITY:  As tolerated.   HISTORY:  This is a re-admission for this pleasant 77 year old white male  with chronic renal insufficiency, hypertension, insulin-dependent diabetes  mellitus, _________prostatism resulting in obstructive uropathy, coronary  artery disease, general malaise and weakness, and chronic anemia.  The  patient was discharged home on May 04, 2004, with a baseline creatinine of  2.2, after Foley catheter insertion to relieve obstructive uropathy and  gentle IV fluid hydration with normal saline.  During that hospitalization,  his ACE inhibitor was discontinued, but Demodex was restarted.  Demodex had  been held  initially because of volume depletion.  The patient was seen on  May 09, 2004, in the office and his potassium was 5.5 and creatinine was  4.1, which is a marked elevation.  I spoke with Dr. Elvis Coil,  nephrologist, and we decided to readmit the patient and evaluate him.  Foley  catheter was to have been removed on May 11, 2004, with followup with Dr.  Aldean Ast.  The patient is on Flomax.  It is possible the patient has  recurrent volume depletion.  His Demodex dose is 20 mg daily.  Weight is 189  pounds today, and his blood pressure laying is 130/60, pulse 84.  Sitting  blood pressure 126/56, pulse 84.  Standing blood pressure 128/56, pulse 88.  So, he really is not orthostatic.  His recent serum protein electrophoresis  was negative.  His recent MRI was negative for renal artery stenosis.  He is  subsequently admitted for further evaluation.   HOSPITAL COURSE:  The patient was admitted to a regular medical floor and  was placed on a 2 g sodium, fat modified, 1800 calorie ADA renal failure  diet.  He was given normal saline at 50 cc an hour.  Was continued on  Flomax, Norvasc, Toprol, minoxidil, Catapres, Imdur, aspirin, and Humalog.  He also was on Protonix 40 mg daily for a recent diagnosis on endoscopy of  gastritis and duodenitis.  He also has been receiving Aranesp  100 mcg subcutaneously for chronic anemia.  Urine microscopy was ordered and  was negative.  The patient responded to IV fluid hydration quite nicely.  Initially, on admission hemoglobin was 10.1 g, MCV 88.6, white blood cell  count 3700, sodium 135, potassium 4.2, chloride 103, CO2 22, BUN 67,  creatinine 3.8.  At the time of discharge, May 12, 2004, sodium 134,  potassium 4.3, chloride 105, CO2 22, BUN 53, creatinine 2.5.  Urine sodium  at the time of admission was 73.  Liver functions normal.  No casts or  crystals seen on urine microscopy.  No evidence of urinary tract infection.  During his hospital course, he  remained afebrile and his vital signs  remained stable.  He was only given IV fluids.  Renal service felt that the  patient suffered from volume depletion due to the restarting of Demodex.  They decided to discontinue his Demodex at this point.  He was discharged  home in improved condition on May 12, 2004, on Flomax 0.4 mg daily, Norvasc  10 mg daily, Toprol 100 mg daily, minoxidil 2.5 mg b.i.d., Catapres TTS 3  patch to skin weekly, Imdur 60 mg daily, aspirin 325 mg daily, Humalog  insulin 30 units subcutaneously q.a.m. and 27 units at bedtime, Protonix 40  mg daily, Reglan 10 mg a.c. and h.s., Phenergan 25 mg q.4h. p.r.n. nausea,  Lipitor 20 mg daily, Lexapro 5 mg every other day for two weeks then  discontinue.   FOLLOWUP:  1. Follow up appointments will be arranged with cardiology, endocrinology,     and nephrology.  2. He will see me on Thursday, May 19, 2004, at 12 noon.                                                Luanna Cole. Lenord Fellers, M.D.    MJB/MEDQ  D:  07/14/2004  T:  07/15/2004  Job:  [id number]

## 2011-05-12 NOTE — Consult Note (Signed)
[facility]                          ENDOCRINOLOGY CONSULTATION   NAME:[name], [name]                         MRN:          [id number]  DATE:01/29/2007                            DOB:          August 26, 1933    REASON FOR VISIT:  Followup diabetes.   HISTORY OF PRESENT ILLNESS:  A 77 year old male who has increased his  insulin because his requirement seems to have gone up. His glucose is  often above 150 in the morning but then he states that it improves to  the low 100s at other times of day.   PAST MEDICAL HISTORY:  1. CAD.  2. Chronic renal insufficiency.  3. Diabetes peripheral sensory neuropathy.  4. Hypertension.  5. Anemia due to renal insufficiency.  6. Osteoarthritis.  7. Depression.  8. Dyslipidemia.  9. Diabetes retinopathy.  10.Colonic polyps.   REVIEW OF SYSTEMS:  Mild hypoglycemia, several times before lunch.   PHYSICAL EXAMINATION:  VITAL SIGNS:  Blood pressure 137/65, heart rate  66, temperature 97.2, the weight is 198.  GENERAL:  No distress.  NEUROLOGIC:  He appears slightly depressed.   LABORATORY DATA:  Hemoglobin A1c on January 29, 2007 is 8.0.   IMPRESSION:  He needs an increase in his insulin therapy. It is not  clear why his requirements have gone up.   PLAN:  1. Increase Lantus to 17 units q.h.s.  2. Change q. a.c. Humalog to 20-12-23.  3. Return in 30 days.    Sean A. Everardo All, MD  Electronically Signed   SAE/MedQ  DD: 01/31/2007  DT: 01/31/2007  Job #: [id number]   cc:   Luanna Cole. Lenord Fellers, M.D.

## 2011-05-12 NOTE — H&P (Signed)
[name], Jones NO.:  [id number]   MEDICAL RECORD NO.:  [id number]          PATIENT TYPE:  EMS   LOCATION:  MAJO                         FACILITY:  [facility]   PHYSICIAN:  Michaelyn Barter, M.D. DATE OF BIRTH:  18-Oct-1933   DATE OF ADMISSION:  02/14/2006  DATE OF DISCHARGE:                                HISTORY & PHYSICAL   The patient's primary care physician is Dr. Luanna Cole. Baxley.  The patient's  oncologist is Dr. Kimberlee Nearing.  The patient's nephrologist is Dr.  Vic Blackbird.   CHIEF COMPLAINT:  Nausea, vomiting, weakness.   HISTORY OF PRESENT ILLNESS:  Mr. [name] is a 77 year old gentleman with a  past medical history of Parkinson's disease, coronary artery disease status  post CABG, htn and chronic renal insufficiency, who is accompanied by his  wife and son, who give the history.  According to both the patient's son and  wife, this morning the patient woke up shortly after midnight and had an  episode of emesis.  He went back to bed.  However, later in the day, around  3:00 p.m., he had a repeat episode of emesis.  Since that episode, he has  had multiple episodes of emesis throughout the remaining portion of the day.  There has been no blood present within the emesis.  The patient has become  weaker throughout the course of the day, to the point that it has been very  difficult for him to walk or stand.  He has also been febrile with a  temperature of 102.4 being recorded at home.  He has experienced chills and,  according to the patient's son, he has become more lethargic throughout the  course of the day.  There has been no diarrhea, no complaints of pain.  The  patient himself denies having any chest pain and also denies being short of  breath.   PAST MEDICAL HISTORY:  1.  Sleep apnea for which the patient is on CPAP machine.  2.  Anemia.  3.  Parkinson's disease diagnosed in May of 2005.  4.  Renal insufficiency believed to be secondary  to hypertension and      diabetes mellitus.  5.  History of coronary artery disease.  6.  History of insulin-dependent diabetes mellitus.  7.  Silent prostatism with obstructive symptoms causing renal failure in the      past.  8.  Anemia of chronic disease.  9.  History of fatigue.  10. There is a history of vomiting in the past, etiology of which has been      questionable.  11. History of hypertriglyceridemia.   PAST SURGICAL HISTORY:  1.  Coronary artery bypass graft x 3 vessels, involving the left internal      mammary artery to the left anterior descending coronary artery, the      saphenous vein graft to the diagonal and saphenous graft to the      circumflex posterior descending.  This was completed September 25, 2002 by      Dr. Kathlee Nations Trigt.  2.  Both  eyes had cataract extracted from them.  3.  Angioplasty back in the 1990s.   ALLERGIES:  PROCARDIA produces swelling.   CURRENT HOME MEDICATIONS:  1.  Norvasc 10 mg q.a.m.  2.  Lexapro 20 mg daily.  3.  Flomax 0.4 mg q.a.m.  4.  Minoxidil 2.5 mg at 2:00 a.m. and in the p.m.  5.  Iron tablets, one in the morning and one in the evening.  6.  Imdur 60 mg q.a.m.  7.  Lipitor 20 mg in the evening.  8.  Furosemide 80 mg one tablet b.i.d.  9.  Aspirin 325 mg daily.  10. Darvocet 65 mg every 8 hours p.r.n.  11. Toprol XL 100 mg in the a.m.  12. Phenergan 25 mg.  13. Catapres TTS 0.3 mg q.24h. weekly.  14. Humalog pen, 19/8/19 units.  15. Lantus 9 units q.h.s.  16. Carbidopa/levodopa three times daily, at breakfast, lunch and dinner for      his Parkinson's.  17. Tylenol 500 mg q.a.m.  18. Calcium 600 mg b.i.d.   SOCIAL HISTORY:  Cigarettes:  The patient denies every smoking cigarettes.  Alcohol:  The patient denies ever drinking alcohol.   FAMILY HISTORY:  Father died at the age of 87 secondary to lung cancer.  Mother died at the age of 26 secondary to a stroke.  The patient has two  brothers, one had hypertension, the  second brother had diabetes mellitus and  hypertension.  The patient had one sister who has a history of thyroid  cancer and breast cancer.  Another sister has a history of hypotension.   REVIEW OF SYSTEMS:  As per HPI.   PHYSICAL EXAMINATION:  GENERAL:  The patient is lying on the stretcher.  He  appears to be asleep but he arouses very easily.  He looks tired, is  somewhat lethargic.  He shows no obvious signs of respiratory distress.  There is no tachypnea.  There is no dyspnea.  He is using no accessory  muscles.  VITAL SIGNS:  His temperature is 102.7, blood pressure 173/73, heart rate  94, respirations 18.  O2 SAT 85% on room air.  HEENT:  Head is normocephalic, atraumatic.  Pupils are anicteric.  Extraocular movements are intact.  Decreased reactivity to light.  Oral  mucosa is dry.  No exudates, no thrush.  NECK:  Supple.  No JVD appreciated.  No lymphadenopathy.  Thyroid is not  palpable.  CARDIAC:  S1, S2.  Regular rate and rhythm.  No murmurs, no gallops, no  rubs.  RESPIRATORY:  Breath sounds are slightly diminished bilaterally.  No  crackles or wheezes.  ABDOMEN:  Soft, nontender, nondistended.  Positive bowel sounds.  No  hepatosplenomegaly.  No masses.  EXTREMITIES:  No leg edema.  NEUROLOGIC:  The patient is alert and oriented x 3.  Cranial nerves 2-12 are  intact.  However, the gag reflex was not tested.  MUSCULOSKELETAL:  Is 5/5, both upper and lower extremity strength.   LABORATORY:  ABG:  The pH is 7.509, PCO2 is 24.9, bicarb is 19.8.  Urinalysis is negative.  White count is 11.5, hemoglobin 15.4, hematocrit  44.3, platelets 119.  Sodium 138, potassium 4, chloride 107, BUN 54,  creatinine 2.6, glucose 245.  Chest x-ray reveals a right lower lobe  density, possibly pneumonia.   ASSESSMENT AND PLAN:  Mr. [name] is a 77 year old gentleman being admitted into the hospital secondary to multiple episodes of nausea and vomiting,  progressive weakness and lethargy.  1.  Right-sided pneumonia.  The patient has already received one dose of      ceftriaxone in the emergency department.  Will continue this for now.      In addition, will also add azithromycin 400 mg IV daily.  Will attempt      to obtain a sputum and send for Grams's stain and culture.  Will also      check blood cultures x 2.  Will provide albuterol plus Atrovent via      nebulized breathing treatments and also provide an incentive spirometer      to the patient.  2.  Nausea and vomiting.  Will provide Zofran on a p.r.n. basis, IV for now.  3.  Acute-on-chronic renal insufficiency.  From prior notes, the patient's      baseline creatinine is 2.2.  His current creatinine is slightly elevated      at 2.6  There may be a prerenal component to this triggered by      dehydration secondary to the multiple episodes of emesis.  Will gently      hydrate the patient and will monitor closely for now.  4.  Thrombocytopenia.  The source of this is questionable.  The patient      currently shows no obvious signs of bleed.  Therefore will monitor his      platelets for now.  5.  History of sleep apnea.  Will resume CPAP at bedtime.  6.  History of Parkinson's disease.  Will resume carbidopa and levodopa.  7.  Uncontrolled hypertension.  The patients multiple episodes of emesis may      be related to this, he may not have been able to keep down his      antihypertensive medications.  Will consider providing temporary IV      hypertensive medications for now, until the patient's nausea and      vomiting resolves.  8.  Diabetes mellitus.  In light of the patient's multiple episodes of      emesis and his current poor p.o. intake, will hold the patient's      regularly prescribed home regimen of insulin for now, in order to      prevent the patient from becoming hypoglycemic.  Will perform Accu-Cheks      and monitor closely.  9.  History of coronary artery disease status post coronary artery bypass       graft.  The patient currently denies having chest pain or shortness of      breath.  Will monitor this.  10. Weakness.  This is most likely multifactorial including both the      pneumonia, the multiple episodes of nausea and emesis, in addition to      dehydration.  Will monitor and consider consultation with physical      therapy.  11. Gastrointestinal prophylaxis.  Will provide Protonix 40 mg IV daily.  12. Deep venous thrombosis prophylaxis.  In light of the patient being      thrombocytopenic, will hold off on Lovenox and will, instead, supply SCD      boots.      Michaelyn Barter, M.D.  Electronically Signed     OR/MEDQ  D:  02/15/2006  T:  02/15/2006  Job:  [id number]   cc:   Luanna Cole. Lenord Fellers, M.D.  Fax: [phone]   Samul Dada, M.D.  Fax: [phone]   Courtney Paris, M.D.  Fax: [phone]

## 2011-05-12 NOTE — Consult Note (Signed)
[facility]                            ENDOCRINOLOGY CONSULTATION   NAME:Mcduffee, [name]                         MRN:          [id number]  DATE:10/19/2006                            DOB:          May 28, 1933    REASON FOR VISIT:  Follow up diabetes.   HISTORY OF PRESENT ILLNESS:  This 77 year old man is taking his insulin as  prescribed.  He states his glucose's are well controlled except he is often  below 90 before lunch but is in the high 100's at h.s. and in the morning.   PAST MEDICAL HISTORY:  Same as November 03, 2005.   REVIEW OF SYSTEMS:  He has gained a few pounds since his last visit. He  denies hypoglycemia.   PHYSICAL EXAMINATION:  VITAL SIGNS: Blood pressure 138/55, heart rate 78,  temperature is 99.0. The weight is 205.  GENERAL:  In no distress.  FEET: There is 2+ right leg edema, 1+ in the left. The feet have normal  color and temperature, there is no ulcer present on the feet and sensation  is intact to touch on the feet.   LABORATORY STUDIES:  On October 17, 2006: Hemoglobin A1C is 6.7.   IMPRESSION:  His control continues to be good, but he does appear to need  some adjustment in his insulin.   PLAN:  1. Continue the Lantus at 14 units q.h.s.  2. Change q.a.c. Humalog to 18 breakfast, 8 lunch and 20 supper.  3. Return in about 4 months.    ______________________________  Cleophas Dunker. Everardo All, MD    SAE/MedQ  DD: 10/19/2006  DT: 10/21/2006  Job #: [id number]   cc:   Luanna Cole. Lenord Fellers, M.D.

## 2011-05-12 NOTE — Discharge Summary (Signed)
[name], [name] NO.:  [id number]   MEDICAL RECORD NO.:  [id number]                   PATIENT TYPE:  INP   LOCATION:  2006                                 FACILITY:  [facility]   PHYSICIAN:  Kerin Perna, M.D.               DATE OF BIRTH:  [birth year]   DATE OF ADMISSION:  09/25/2002  DATE OF DISCHARGE:  09/30/2002                                 DISCHARGE SUMMARY   ADMITTING DIAGNOSIS:  Coronary artery disease.   SECONDARY DIAGNOSES:  1. Postoperative anemia secondary to blood loss.  2. Noninsulin-dependent diabetes mellitus.  3. Hypertension.   DISCHARGE DIAGNOSIS:  Coronary artery disease.   HOSPITAL COURSE:  The patient was admitted to [facility] on September 25, 2002 after being seen and evaluated by Dr. Donata Clay as an outpatient  secondary to known coronary artery disease.  On date of admission the  patient underwent a coronary artery bypass graft x3 with the left internal  mammary artery anastomosed to left anterior descending artery, saphenous  vein graft to the diagonal artery, saphenous vein graft to the circumflex  artery.  The patient required 3 units of packed red blood cells  intraoperatively secondary to low hemoglobin while on bypass.  During his  postoperative course he had adequate control of his diabetes mellitus with  Lantus Insulin, Actos, and sliding scale insulin.  He had mild postop anemia  secondary to blood loss which he tolerated well.  Hemoglobin and hematocrit  remained stable at 7.7 and 22.0.  He had a platelet count of 158.  The  remainder of his hospital course was uneventful.  He was subsequently deemed  stable for discharge home on postoperative day #5, September 30, 2002.   MEDICATIONS AT TIME OF DISCHARGE:  1. Toprol XL 50 mg one daily.  2. Altace 5 mg one daily.  3. Lantus Insulin 24 units subcutaneously each evening.  4. Aspirin 325 mg one daily.  5. Niferex 150 one daily.  6. Actos 15 mg one  daily.  7. Tylox one to two tabs every 4-6 hours as needed for pain.   ACTIVITY:  The patient was told no driving or strenuous activity or lifting  heavy objects.  He was told to walk daily and continue using his incentive  spirometer.   DIET:  Discharge diet 1800 calorie ADA diet.   WOUND CARE:  The patient was told he could shower and clean his incisions  with soap and water.   DISPOSITION:  Home.    FOLLOW UP:  The patient was told to call his cardiologist Dr. Gwen Pounds for a  2-week appointment.  He was told to see Dr. Donata Clay at the CVTS office on  Friday, October 17, 2002 at 10:30 a.m.  He was told to bring chest x-ray to  this appointment with him.     Levin Erp. Steward,  P.A.                      Kerin Perna, M.D.    BGS/MEDQ  D:  09/29/2002  T:  10/02/2002  Job:  [id number]   cc:   Olena Heckle, M.D.  [address]  [place], [place] [address]   CVTS Office

## 2011-05-12 NOTE — Consult Note (Signed)
[name], [name]                            ACCOUNT NO.:  [id number]   MEDICAL RECORD NO.:  [id number]                   PATIENT TYPE:  INP   LOCATION:  4712                                 FACILITY:  [facility]   PHYSICIAN:  Maree Krabbe, M.D.             DATE OF BIRTH:  02-15-1933   DATE OF CONSULTATION:  DATE OF DISCHARGE:                                   CONSULTATION   REASON FOR CONSULTATION:  Elevated creatinine.   HISTORY:  This patient is a 77 year old white male with longstanding  diabetes mellitus type 2, hypertension and coronary artery disease with  prior CABG.  Hospitalized in February of this year for malignant  hypertension at [facility].  At that time his blood  pressure was 180/100 and improved with medication.  He has been on long-term  blood pressure medicines also.  His creatinine was 2.2 on that  hospitalization.  He started seeing Dr. Lenord Fellers and recently has been  experiencing increasing fatigue over the past five months and has developed  nausea and vomiting over the past week or two.  He has been treated for  anemia over the past month with negative workup __________  .  He has been  put on an ARB, and that was changed to an ACE inhibitor, lisinopril, which  he apparently takes.  Now the patient is admitted with a one-week history of  nausea and vomiting as well as a syncopal episode at work.  His creatinine  is 4.6 with a BUN of 100 and potassium at 6.2 according to outpatient labs  done today.  He denies any use of over-the-counter NSAIDs.  He denies  shortness of breath or edema.   PAST MEDICAL HISTORY:  1. Hypertension.  2. Diabetes mellitus type 2, over 20 years' duration with a history of     retinopathy.  3. CAD with CABG in 2003.  4. Anemia.  5. __________  .   SOCIAL HISTORY:  No tobacco or alcohol.  He is married and lives with his  wife.  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   FAMILY HISTORY:  Significant for hypertension.   ALLERGIES:  PROCARDIA __________  .   REVIEW OF SYSTEMS:  GENERAL: Denies any chills, weight loss, night sweats.  ENT:  Denies __________  CARDIOVASCULAR:  __________  orthopnea, PND,  pleuritic chest pain, hemoptysis, __________  cough.  GI: As above and no  abdominal pain.  GU: Denies any changes in voiding habits such as incomplete  voiding, difficulty starting a stream or any history of prostate enlargement  in the past.  MUSCULOSKELETAL: Denies myalgia, arthralgia or swelling.  NEUROLOGIC: Denies any focal numbness or weakness.  No history of a stroke,  TIA or seizure.  ENDOCRINE: Diabetic as above.  No history of heat or cold  intolerance.  HEMATOLOGIC: No history of bruising or easy bleeding.   PHYSICAL EXAMINATION:  VITAL  SIGNS: Blood pressure 140/80 lying and 150/80  standing, heart rate 70 lying and 88 standing, respirations 16, temp 98.6.  GENERAL: This is a well-developed, alert, older male in no distress.  Appears younger than his stated age.  SKIN: Without rash or cyanosis.  HEENT: PERRL.  EOMI.  Conjunctivae are pink.  Throat was clear and moist.  NECK: Supple with elevated JVP around 14 cm.  CHEST: Clear throughout bilaterally.  CARDIAC: Regular rate and rhythm without murmur, rub or gallop.  ABDOMEN: Soft, nontender, nondistended.  No organomegaly.  There is a right  femoral bruit which is faint.  EXTREMITIES: There is no peripheral edema.  The pulses in the feet are  diminished.  There is no peripheral cyanosis or ulceration.  NEUROLOGIC: Grossly nonfocal.  MOTOR EXAM: No asterixis.   LABS:  Hemoglobin 9.9, platelets 232, albumin 4.1, calcium 9.7, sodium 134,  potassium 5.6, CO2 of 21, BUN 99 and creatinine 4.6.   IMPRESSION:  1. Acute and chronic renal failure most likely due to bladder outlet     obstruction versus volume depletion in the setting of an ACE inhibitor.     __________  volume depleted on exam.  2. Anemia, due to chronic renal failure most likely.   3. Hypertension longstanding.  4. Longstanding diabetes mellitus with retinopathy.  5. Chronic renal failure with a creatinine of 2.2 in February of 2005.  6. Coronary artery disease with a history of coronary artery bypass grafting     in 2003.   RECOMMENDATIONS:  1. Hold ACE inhibitor and diuretics as you are doing and administer cautious     IV fluids overnight.  2. Check labs in the morning.  3. Renal ultrasound.  4. Place Foley after attempting to void.  5. Urinalysis, SPEP and UPEP, and a 24-hour urine has been ordered.  6. __________  and recheck the potassium.  7. No need for dialysis at this time.  Will follow him closely.                                               Maree Krabbe, M.D.    RDS/MEDQ  D:  04/29/2004  T:  05/01/2004  Job:  [id number]

## 2011-05-12 NOTE — Op Note (Signed)
NAMEEVREN, SHANKLAND                  ACCOUNT NO.:  [id number]   MEDICAL RECORD NO.:  [id number]          PATIENT TYPE:  OUT   LOCATION:  [facility]                          FACILITY:  [facility]   PHYSICIAN:  Michael L. Reynolds, M.D.DATE OF BIRTH:  [birth year]   DATE OF PROCEDURE:  11/02/2004  DATE OF DISCHARGE:                                 OPERATIVE REPORT   PROCEDURE:  Diagnostic high volume lumbar puncture.   SURGEON:  Casimiro Needle L. Thad Ranger, M.D.   INDICATIONS FOR PROCEDURE:  Possible normal pressure hydrocephalus.   DESCRIPTION OF PROCEDURE:  Informed consent was obtained, signed and placed  on the chart after the procedure risks and benefits were explained to the  patient and he agreed to proceed.  The patient was placed in the right  lateral decubitus position and prepped and draped in the usual sterile  fashion.  Local anesthesia was achieved with 2 mL of lidocaine.  A 20-gauge  spinal needle was inserted into the L3-4 interspace and advanced until  clear.  CSF was obtained.  Opening pressure was measured at 255 mm of water.  Approximately 38 mL of CSF was withdrawn.  The first tube of 8 mL of CSF was  sent for laboratory analysis including cell count, differential, glucose and  protein.  The remainder will be held in the lab for p.r.n. tests.  After  withdrawal of high volume CSF, closing pressure was measured at 130 mm of  water.  Needle was withdrawn.  Hemostasis was obtained.  No immediate  complications of the procedure were noted.  The patient was advised to  remain supine for one hour following procedure.  The patient underwent a  physical therapy evaluation this morning and is due to undergo a second  evaluation approximately one hour following the procedure to document any  identifiable gait changes if any.  He was advised to present immediately to  the emergency room if he developed fever, stiff neck, unrelenting low back  pain, altered mental status, urinary or bowel  incontinence or inability to  move the legs and to call over the next few days if he developed a postural  headache.  His wife was provided with the same precautions.       MLR/MEDQ  D:  11/02/2004  T:  11/02/2004  Job:  [id number]

## 2011-05-16 ENCOUNTER — Other Ambulatory Visit (INDEPENDENT_AMBULATORY_CARE_PROVIDER_SITE_OTHER): Payer: Medicare Other

## 2011-05-16 ENCOUNTER — Encounter: Payer: Self-pay | Admitting: Endocrinology

## 2011-05-16 ENCOUNTER — Ambulatory Visit (INDEPENDENT_AMBULATORY_CARE_PROVIDER_SITE_OTHER): Payer: Medicare Other | Admitting: Endocrinology

## 2011-05-16 ENCOUNTER — Encounter: Payer: Self-pay | Admitting: Internal Medicine

## 2011-05-16 VITALS — BP 132/70 | HR 109 | Temp 98.2°F | Ht 73.0 in | Wt 208.8 lb

## 2011-05-16 DIAGNOSIS — E109 Type 1 diabetes mellitus without complications: Secondary | ICD-10-CM

## 2011-05-16 LAB — HEMOGLOBIN A1C: Hgb A1c MFr Bld: 6.7 % — ABNORMAL HIGH (ref 4.6–6.5)

## 2011-05-16 NOTE — Patient Instructions (Addendum)
pending the test results, please reduce novolog to (just before each meal) 25-20-45-15 units (the 15 units is if you eat a bedtime snack).  take extra novolog 5 units as needed for any sugar over 300. Please schedule a follow-up appointment in 3 months. continue nph 75 units at night only. check your blood sugar 4 times a day--before the 3 meals, and at bedtime.  also check if you have symptoms of your blood sugar being too high or too low.  please keep a record of the readings and bring it to your next appointment here.  please call us sooner if you are having low blood sugar episode. good diet and exercise habits significanly improve the control of your diabetes.  please let me know if you wish to be referred to a dietician.  high blood sugar is very risky to your health.  you should see an eye doctor every year. controlling your blood pressure and cholesterol drastically reduces the damage diabetes does to your body.  this also applies to quitting smoking.  please discuss these with your doctor.  you should take an aspirin every day, unless you have been advised by a doctor not to. (update: i left message on phone-tree:  Increase nph to 80 units qhs).

## 2011-05-16 NOTE — Progress Notes (Signed)
Subjective:    Patient ID: [name], male    DOB: 12/09/1933, 77 y.o.   MRN: [id number]  HPI pt states he feels well in general.  he brings a record of his cbg's which i have reviewed today.  pt states he feels well in general, except for slight weight gain.  cbg is still highest in am (almost all are over 200, and lowest before lunch (low-100's).   Past Medical History  Diagnosis Date  . SLEEP APNEA, OBSTRUCTIVE 03/27/2009    CPAP qhs  . HYPERTENSION 07/29/2007  . HYPERCHOLESTEROLEMIA 03/26/2008  . GERD 07/29/2007  . DIABETES MELLITUS, TYPE I 07/29/2007  . VITAMIN B12 DEFICIENCY 03/27/2009  . ANEMIA, MACROCYTIC 03/31/2008  . DEPRESSION 07/29/2007  . PARKINSON'S DISEASE 03/31/2008  . PERIPHERAL NEUROPATHY 03/27/2009  . CORONARY ARTERY DISEASE 07/29/2007    s/p  CABG 2003-cath 12/31/09-occluded sap dx  graft, otherwise patent grafts  . Chronic systolic heart failure 03/31/2010    LVEF 35-40% by cath 12/31/09 s/p BiV ICD 07/2010  . Gastroparesis 11/24/2008  . RENAL FAILURE, END STAGE 10/21/2009    HD MWD a/w anemia of chronic dz, hypotension  . CONGESTIVE HEART FAILURE, HX OF 03/27/2009  . CATARACT EXTRACTIONS, BILATERAL, HX OF 03/27/2009  . CORONARY ARTERY BYPASS GRAFT, THREE VESSEL, HX OF 09/25/2002  . AUTOMATIC IMPLANTABLE CARDIAC DEFIBRILLATOR SITU 11/10/2010  . COLONIC POLYPS, HX OF 07/29/2007    Past Surgical History  Procedure Date  . Coronary artery bypass graft   . Cataract extraction, bilateral   . Pacemaker placement     permanent    History   Social History  . Marital Status: Married    Spouse Name: N/A    Number of Children: N/A  . Years of Education: N/A   Occupational History  . Not on file.   Social History Main Topics  . Smoking status: Never Smoker   . Smokeless tobacco: Not on file   Comment: Married, lives with spouse, supportive dtr and son nearby. Pt is on dialysis  . Alcohol Use: No  . Drug Use: No  . Sexually Active:    Other Topics Concern  . Not on file   Social  History Narrative   Pt does not get regular exercise.Pt is on dialysis-MWF_since 10/2010Lives with spouse, supportive dtr and son nearby    Current Outpatient Prescriptions on File Prior to Visit  Medication Sig Dispense Refill  . hydrOXYzine (ATARAX) 25 MG tablet TAKE AS DIRECTED THE MORNING OF DIALYSIS  30 tablet  1  . DISCONTD: HUMULIN N PEN 100 UNIT/ML injection INJECT 75 UNITS UNDER THE SKIN EVERY NIGHT AT BEDTIME  45 mL  2    Allergies  Allergen Reactions  . Nifedipine     Family History  Problem Relation Age of Onset  . Stroke Mother   . Diabetes Father   . Cancer Father     Lung  . Diabetes Brother   . Cancer Paternal Aunt     Stomach Cancer    BP 132/70  Pulse 109  Temp(Src) 98.2 F (36.8 C) (Oral)  Ht 6\' 1"  (1.854 m)  Wt 208 lb 12.8 oz (94.711 kg)  BMI 27.55 kg/m2  SpO2 93%    Review of Systems denies hypoglycemia.    Objective:   Physical Exam GENERAL: no distress Pulses: dorsalis pedis intact bilat.   Feet: no deformity.  no ulcer on the feet.  feet are of normal color and temp.  no edema Neuro: sensation is intact  to touch on the feet      Fructosamine= 365 (converts to a1c of 7.3) Lab Results  Component Value Date   HGBA1C 6.7* 05/16/2011      Assessment & Plan:  Dm, he needs only a slight adjustment in his therapy

## 2011-05-17 LAB — FRUCTOSAMINE: Fructosamine: 365 umol/L — ABNORMAL HIGH (ref <285)

## 2011-05-25 ENCOUNTER — Encounter: Payer: Medicare Other | Admitting: *Deleted

## 2011-05-27 ENCOUNTER — Encounter: Payer: Self-pay | Admitting: *Deleted

## 2011-05-30 ENCOUNTER — Ambulatory Visit (INDEPENDENT_AMBULATORY_CARE_PROVIDER_SITE_OTHER): Payer: Medicare Other | Admitting: *Deleted

## 2011-05-30 ENCOUNTER — Other Ambulatory Visit: Payer: Self-pay | Admitting: Internal Medicine

## 2011-05-30 DIAGNOSIS — I5022 Chronic systolic (congestive) heart failure: Secondary | ICD-10-CM

## 2011-05-30 DIAGNOSIS — I428 Other cardiomyopathies: Secondary | ICD-10-CM

## 2011-05-30 DIAGNOSIS — Z95810 Presence of automatic (implantable) cardiac defibrillator: Secondary | ICD-10-CM

## 2011-06-06 ENCOUNTER — Ambulatory Visit (INDEPENDENT_AMBULATORY_CARE_PROVIDER_SITE_OTHER): Payer: Medicare Other | Admitting: Internal Medicine

## 2011-06-06 ENCOUNTER — Encounter: Payer: Self-pay | Admitting: Internal Medicine

## 2011-06-06 ENCOUNTER — Other Ambulatory Visit: Payer: Self-pay | Admitting: Neurological Surgery

## 2011-06-06 VITALS — BP 134/68 | HR 113 | Temp 97.9°F | Ht 73.0 in | Wt 205.0 lb

## 2011-06-06 DIAGNOSIS — R112 Nausea with vomiting, unspecified: Secondary | ICD-10-CM

## 2011-06-06 DIAGNOSIS — R51 Headache: Secondary | ICD-10-CM

## 2011-06-06 DIAGNOSIS — R1110 Vomiting, unspecified: Secondary | ICD-10-CM

## 2011-06-06 DIAGNOSIS — IMO0001 Reserved for inherently not codable concepts without codable children: Secondary | ICD-10-CM

## 2011-06-06 DIAGNOSIS — R1310 Dysphagia, unspecified: Secondary | ICD-10-CM

## 2011-06-06 NOTE — Progress Notes (Signed)
  Subjective:    Patient ID: [name], male    DOB: 12/25/1933, 77 y.o.   MRN: [id number]  HPI complains of nausea onset symptoms 2-3 mo ago Intermittent events - occurs 2-3x/week, usually in AM before breakfast associated with with "vomit" after prolonged gag - productive of foam/phlegm, occ food if event after meal Not associated with chest pain, abdominal pain or change in bowels -  no weight changes Improved with phenergan prn but symptoms not usually severe enough or long duration to warrant med use Taking PPI as rx'd by GI - prev felt "too high risk" for EGD and colo by GI (fall 2011 note reviewed)  Past Medical History  Diagnosis Date  . SLEEP APNEA, OBSTRUCTIVE     CPAP qhs  . VITAMIN B12 DEFICIENCY   . PARKINSON'S DISEASE   . CORONARY ARTERY DISEASE     s/p  CABG 2003-cath 12/31/09-occluded sap dx  graft, otherwise patent grafts  . Chronic systolic heart failure     LVEF 35-40% by cath 12/31/09 s/p BiV ICD 07/2010  . Gastroparesis   . CORONARY ARTERY BYPASS GRAFT, THREE VESSEL, HX OF 2003  . AUTOMATIC IMPLANTABLE CARDIAC DEFIBRILLATOR SITU 07/2010 BiV ICD  . ANEMIA, MACROCYTIC   . DEPRESSION   . DIABETES MELLITUS, TYPE I   . GERD   . HYPERCHOLESTEROLEMIA   . HYPERTENSION   . RENAL FAILURE, END STAGE     HD MWD a/w anemia of chronic dz, hypotension  . PERIPHERAL NEUROPATHY    Review of Systems  Constitutional: Negative for fever.  Respiratory: Negative for shortness of breath.   Cardiovascular: Negative for chest pain.  Gastrointestinal: Negative for abdominal pain, diarrhea, constipation and blood in stool.  Genitourinary: Negative for flank pain.       Objective:   Physical Exam BP 134/68  Pulse 113  Temp(Src) 97.9 F (36.6 C) (Oral)  Ht 6\' 1"  (1.854 m)  Wt 205 lb (92.987 kg)  BMI 27.05 kg/m2  SpO2 95%  Physical Exam  Constitutional:  oriented to person, place, and time. appears well-developed and well-nourished. No distress. Wife/dtr at side Neck:  Normal range of motion. Neck supple. No JVD present. No thyromegaly present.  Cardiovascular: Normal rate, regular rhythm and normal heart sounds.  No murmur heard. No BLE edema Pulmonary/Chest: Effort normal and breath sounds normal. No respiratory distress. no wheezes.  Abdominal: Soft. Bowel sounds are normal. Patient exhibits no distension. There is no tenderness      Wt Readings from Last 3 Encounters:  06/06/11 205 lb (92.987 kg)  05/16/11 208 lb 12.8 oz (94.711 kg)  02/21/11 205 lb (92.987 kg)     Assessment & Plan:  Nausea - occ "phlegm vomiting" vs regurgitation - no GERD symptoms or weight loss, ?stricture or dysphagia - check barium swallow and consider need for GI re-eval - on PPI, recent labs reviewed today (from HD) and unremarkable - improved anemia of chronic dx with epo and iron per renal

## 2011-06-06 NOTE — Patient Instructions (Addendum)
It was good to see you today. We have reviewed your prior records including labs and tests today Medications reviewed, no changes at this time. we'll make referral for barium swallow to evaluate your symptoms. Our office will contact you regarding appointment(s) once made. We may need to re-visit with Dr. Jarold Motto based on these results Please keep scheduled follow up as planned 07/2011; call sooner if problems.

## 2011-06-07 ENCOUNTER — Other Ambulatory Visit (HOSPITAL_COMMUNITY): Payer: Self-pay | Admitting: Internal Medicine

## 2011-06-07 DIAGNOSIS — R110 Nausea: Secondary | ICD-10-CM

## 2011-06-07 DIAGNOSIS — R112 Nausea with vomiting, unspecified: Secondary | ICD-10-CM

## 2011-06-07 NOTE — Progress Notes (Signed)
icd remote check  

## 2011-06-08 ENCOUNTER — Ambulatory Visit
Admission: RE | Admit: 2011-06-08 | Discharge: 2011-06-08 | Disposition: A | Discharge status: Home / Self Care | Payer: Medicare Other | Source: Ambulatory Visit | Attending: Neurological Surgery | Admitting: Neurological Surgery

## 2011-06-08 ENCOUNTER — Ambulatory Visit (HOSPITAL_COMMUNITY)
Admission: RE | Admit: 2011-06-08 | Discharge: 2011-06-08 | Disposition: A | Discharge status: Home / Self Care | Payer: Medicare Other | Source: Ambulatory Visit | Attending: Internal Medicine | Admitting: Internal Medicine

## 2011-06-08 DIAGNOSIS — R112 Nausea with vomiting, unspecified: Secondary | ICD-10-CM

## 2011-06-08 DIAGNOSIS — R51 Headache: Secondary | ICD-10-CM

## 2011-06-08 DIAGNOSIS — R1310 Dysphagia, unspecified: Secondary | ICD-10-CM | POA: Insufficient documentation

## 2011-06-12 ENCOUNTER — Telehealth: Payer: Self-pay | Admitting: Internal Medicine

## 2011-06-12 DIAGNOSIS — R112 Nausea with vomiting, unspecified: Secondary | ICD-10-CM

## 2011-06-12 NOTE — Telephone Encounter (Signed)
Called pt no answer LMOM RTC concerning barium swallowing test..Marland Kitchen6/18/12@11 :41am/LMB

## 2011-06-12 NOTE — Telephone Encounter (Signed)
Swallow results did not show specific abnormality causing his eating/nausea/vomtting symptoms - will refer to GI for further eval and discussion of regurgitation symptoms - [facility] will contact once arranged -

## 2011-06-12 NOTE — Telephone Encounter (Signed)
Wife return call back gave results concerning barium swallow test..06/12/11@4 :27pm/LMB

## 2011-06-16 ENCOUNTER — Encounter: Payer: Self-pay | Admitting: *Deleted

## 2011-06-22 ENCOUNTER — Ambulatory Visit (INDEPENDENT_AMBULATORY_CARE_PROVIDER_SITE_OTHER): Payer: Medicare Other | Admitting: Gastroenterology

## 2011-06-22 ENCOUNTER — Encounter: Payer: Self-pay | Admitting: Gastroenterology

## 2011-06-22 VITALS — BP 108/50 | HR 100 | Ht 73.0 in | Wt 208.0 lb

## 2011-06-22 DIAGNOSIS — K3184 Gastroparesis: Secondary | ICD-10-CM

## 2011-06-22 DIAGNOSIS — K219 Gastro-esophageal reflux disease without esophagitis: Secondary | ICD-10-CM

## 2011-06-22 DIAGNOSIS — R110 Nausea: Secondary | ICD-10-CM | POA: Insufficient documentation

## 2011-06-22 DIAGNOSIS — Z7901 Long term (current) use of anticoagulants: Secondary | ICD-10-CM

## 2011-06-22 DIAGNOSIS — R1312 Dysphagia, oropharyngeal phase: Secondary | ICD-10-CM | POA: Insufficient documentation

## 2011-06-22 DIAGNOSIS — Z131 Encounter for screening for diabetes mellitus: Secondary | ICD-10-CM | POA: Insufficient documentation

## 2011-06-22 DIAGNOSIS — E1149 Type 2 diabetes mellitus with other diabetic neurological complication: Secondary | ICD-10-CM

## 2011-06-22 DIAGNOSIS — E1143 Type 2 diabetes mellitus with diabetic autonomic (poly)neuropathy: Secondary | ICD-10-CM

## 2011-06-22 NOTE — Progress Notes (Signed)
History of Present Illness: This is a extremely complex 77 year old white male with mild dementia, chronic renal insufficiency, and multiple comorbid medical problems. He has sleep apnea, gastroparesis, chronic GERD, insulin-dependent diabetes, ventricular peritoneal shunt, and chronic congestive heart failure with coronary artery disease. He has been doing fairly well all chronic PPI therapy but narrow reports severe reflux regurgitation of solid and liquid foods. In the past, has not been felt to be a candidate for endoscopic procedures, but his symptoms are worsening. Barium swallow with speech pathology showed aspiration and decline in his swallowing function since September of 2010. Also noted was cricopharyngeal spasm back flow of liquids calls in aspiration. Her also was residual material in the esophagus noted. Patient is on dialysis on Monday Wednesdays and Fridays and receives regular iron infusions. The patient's family is with him today, and they deny melena, hematochezia, rectal bleeding or lower gastrointestinal symptoms.   Current Medications, Allergies, Past Medical History, Past Surgical History, Family History and Social History were reviewed in [employer] record.   Assessment and plan: Previously diagnosed gastroparesis with secondary acid reflux I will rule out peptic stricture the esophagus. I suspect most of this dysphagia is on neurogenic basis. I have scheduled endoscopy with propofol sedation so that he can go to dialysis later in the morning. If this is unremarkable, we will schedule esophageal manometry reviewed continue his other medications per his multiple physicians. Patient is anticoagulated with heparin at the time of dialysis, aspirin 81 mg a day, and Effient. Adjustments in his diabetic medications will be made for his endoscopic procedures. No diagnosis found.   Please copy her primary care physician, referring physician, and pertinent  subspecialists.

## 2011-06-22 NOTE — Patient Instructions (Signed)
Your procedure has been scheduled for 07/12/2011, please follow the seperate instructions.  Follow the Step 3 Dysphagia diet given today.

## 2011-06-29 ENCOUNTER — Telehealth: Payer: Self-pay | Admitting: Gastroenterology

## 2011-06-29 NOTE — Telephone Encounter (Signed)
Pt's daughter, Eunice Blase stated she was reading over pt's instructions for EGD with Propofil on 07/12/11, and ASA was never mentioned; does he need to hold 81mg  ASA? Explained to Debbie, Dr Jarold Motto usually does not hold ASA for patients; Eunice Blase stated understanding.

## 2011-06-30 ENCOUNTER — Emergency Department (HOSPITAL_COMMUNITY): Payer: Medicare Other

## 2011-06-30 ENCOUNTER — Emergency Department (HOSPITAL_COMMUNITY)
Admission: EM | Admit: 2011-06-30 | Discharge: 2011-06-30 | Disposition: A | Discharge status: Home / Self Care | Payer: Medicare Other | Attending: Emergency Medicine | Admitting: Emergency Medicine

## 2011-06-30 DIAGNOSIS — E119 Type 2 diabetes mellitus without complications: Secondary | ICD-10-CM | POA: Insufficient documentation

## 2011-06-30 DIAGNOSIS — I129 Hypertensive chronic kidney disease with stage 1 through stage 4 chronic kidney disease, or unspecified chronic kidney disease: Secondary | ICD-10-CM | POA: Insufficient documentation

## 2011-06-30 DIAGNOSIS — R112 Nausea with vomiting, unspecified: Secondary | ICD-10-CM | POA: Insufficient documentation

## 2011-06-30 DIAGNOSIS — N189 Chronic kidney disease, unspecified: Secondary | ICD-10-CM | POA: Insufficient documentation

## 2011-06-30 DIAGNOSIS — Z95810 Presence of automatic (implantable) cardiac defibrillator: Secondary | ICD-10-CM | POA: Insufficient documentation

## 2011-06-30 DIAGNOSIS — R109 Unspecified abdominal pain: Secondary | ICD-10-CM | POA: Insufficient documentation

## 2011-06-30 LAB — HEPATIC FUNCTION PANEL
ALT: 15 U/L (ref 0–53)
Alkaline Phosphatase: 31 U/L — ABNORMAL LOW (ref 39–117)
Bilirubin, Direct: <0.1 mg/dL (ref 0.0–0.3)

## 2011-06-30 LAB — CBC
Hemoglobin: 11.8 g/dL — ABNORMAL LOW (ref 13.0–17.0)
MCH: 30.1 pg (ref 26.0–34.0)
MCHC: 32 g/dL (ref 30.0–36.0)
RDW: 15.9 % — ABNORMAL HIGH (ref 11.5–15.5)

## 2011-06-30 LAB — BASIC METABOLIC PANEL
BUN: 17 mg/dL (ref 6–23)
Chloride: 98 mEq/L (ref 96–112)
Creatinine, Ser: 5.6 mg/dL — ABNORMAL HIGH (ref 0.50–1.35)
GFR calc Af Amer: 12 mL/min — ABNORMAL LOW (ref >60)

## 2011-06-30 LAB — PROTIME-INR: Prothrombin Time: 13.3 seconds (ref 11.6–15.2)

## 2011-07-12 ENCOUNTER — Encounter: Payer: Self-pay | Admitting: Gastroenterology

## 2011-07-12 ENCOUNTER — Ambulatory Visit (AMBULATORY_SURGERY_CENTER): Payer: Medicare Other | Admitting: Gastroenterology

## 2011-07-12 DIAGNOSIS — K2970 Gastritis, unspecified, without bleeding: Secondary | ICD-10-CM

## 2011-07-12 DIAGNOSIS — R1312 Dysphagia, oropharyngeal phase: Secondary | ICD-10-CM

## 2011-07-12 DIAGNOSIS — R1310 Dysphagia, unspecified: Secondary | ICD-10-CM

## 2011-07-12 DIAGNOSIS — K3184 Gastroparesis: Secondary | ICD-10-CM

## 2011-07-12 DIAGNOSIS — K219 Gastro-esophageal reflux disease without esophagitis: Secondary | ICD-10-CM

## 2011-07-12 LAB — GLUCOSE, CAPILLARY: Glucose-Capillary: 236 mg/dL — ABNORMAL HIGH (ref 70–99)

## 2011-07-12 MED ORDER — SODIUM CHLORIDE 0.9 % IV SOLN: 500.0000 mL | INTRAVENOUS | Status: DC

## 2011-07-12 NOTE — Patient Instructions (Signed)
Refer to blue and green discharge instruction sheets. Clear liquids for the first hour after discharge, then soft foods for the rest of today. Since problem may be caused by acid reflux, continue PPI (medicine for acid).  See med list.

## 2011-07-13 ENCOUNTER — Encounter: Payer: Self-pay | Admitting: Internal Medicine

## 2011-07-13 ENCOUNTER — Telehealth: Payer: Self-pay | Admitting: *Deleted

## 2011-07-13 DIAGNOSIS — K2990 Gastroduodenitis, unspecified, without bleeding: Secondary | ICD-10-CM

## 2011-07-13 DIAGNOSIS — K2970 Gastritis, unspecified, without bleeding: Secondary | ICD-10-CM

## 2011-07-13 NOTE — Telephone Encounter (Signed)

## 2011-07-19 ENCOUNTER — Telehealth: Payer: Self-pay | Admitting: Internal Medicine

## 2011-07-19 NOTE — Telephone Encounter (Signed)
dr. Vernie Ammons office wants to know and needs a note stating pt can stop his plavix for seven days and asprin for five. Pt is having a ureteroscopy procedure Aug 15, 2011.   dr. Vernie Ammons office fax# [phone]

## 2011-07-23 ENCOUNTER — Other Ambulatory Visit: Payer: Self-pay | Admitting: Cardiovascular Disease

## 2011-07-25 NOTE — Telephone Encounter (Signed)
May proceed with surgery - low risk. May hold plavix and ASA as needed for surgery.

## 2011-07-25 NOTE — Telephone Encounter (Signed)
Faxed

## 2011-07-26 DIAGNOSIS — I639 Cerebral infarction, unspecified: Secondary | ICD-10-CM

## 2011-07-26 HISTORY — DX: Cerebral infarction, unspecified: I63.9

## 2011-08-01 ENCOUNTER — Encounter (INDEPENDENT_AMBULATORY_CARE_PROVIDER_SITE_OTHER): Payer: Medicare Other | Admitting: *Deleted

## 2011-08-01 ENCOUNTER — Other Ambulatory Visit: Payer: Self-pay | Admitting: Cardiology

## 2011-08-01 DIAGNOSIS — I6529 Occlusion and stenosis of unspecified carotid artery: Secondary | ICD-10-CM

## 2011-08-03 ENCOUNTER — Encounter: Payer: Self-pay | Admitting: Internal Medicine

## 2011-08-08 ENCOUNTER — Encounter: Payer: Self-pay | Admitting: Gastroenterology

## 2011-08-08 ENCOUNTER — Emergency Department (HOSPITAL_COMMUNITY): Payer: Medicare Other

## 2011-08-08 ENCOUNTER — Inpatient Hospital Stay (HOSPITAL_COMMUNITY)
Admission: EM | Admit: 2011-08-08 | Discharge: 2011-08-23 | DRG: 064 | Disposition: A | Discharge status: Skilled Nursing Facility | Payer: Medicare Other | Source: Ambulatory Visit | Attending: Internal Medicine | Admitting: Internal Medicine

## 2011-08-08 ENCOUNTER — Inpatient Hospital Stay (HOSPITAL_COMMUNITY): Payer: Medicare Other

## 2011-08-08 ENCOUNTER — Encounter: Payer: Self-pay | Admitting: Internal Medicine

## 2011-08-08 ENCOUNTER — Other Ambulatory Visit: Payer: Self-pay | Admitting: Urology

## 2011-08-08 ENCOUNTER — Telehealth: Payer: Self-pay

## 2011-08-08 ENCOUNTER — Ambulatory Visit (INDEPENDENT_AMBULATORY_CARE_PROVIDER_SITE_OTHER): Payer: Medicare Other | Admitting: Internal Medicine

## 2011-08-08 ENCOUNTER — Encounter (HOSPITAL_COMMUNITY): Payer: Medicare Other

## 2011-08-08 VITALS — BP 158/72 | HR 102 | Temp 98.5°F | Ht 73.0 in | Wt 196.8 lb

## 2011-08-08 DIAGNOSIS — G9340 Encephalopathy, unspecified: Secondary | ICD-10-CM | POA: Diagnosis not present

## 2011-08-08 DIAGNOSIS — G911 Obstructive hydrocephalus: Secondary | ICD-10-CM | POA: Diagnosis present

## 2011-08-08 DIAGNOSIS — Z794 Long term (current) use of insulin: Secondary | ICD-10-CM

## 2011-08-08 DIAGNOSIS — I498 Other specified cardiac arrhythmias: Secondary | ICD-10-CM | POA: Diagnosis present

## 2011-08-08 DIAGNOSIS — Z01812 Encounter for preprocedural laboratory examination: Secondary | ICD-10-CM

## 2011-08-08 DIAGNOSIS — I5022 Chronic systolic (congestive) heart failure: Secondary | ICD-10-CM | POA: Diagnosis present

## 2011-08-08 DIAGNOSIS — G4733 Obstructive sleep apnea (adult) (pediatric): Secondary | ICD-10-CM | POA: Diagnosis present

## 2011-08-08 DIAGNOSIS — I120 Hypertensive chronic kidney disease with stage 5 chronic kidney disease or end stage renal disease: Secondary | ICD-10-CM | POA: Diagnosis present

## 2011-08-08 DIAGNOSIS — I2510 Atherosclerotic heart disease of native coronary artery without angina pectoris: Secondary | ICD-10-CM | POA: Diagnosis present

## 2011-08-08 DIAGNOSIS — G8190 Hemiplegia, unspecified affecting unspecified side: Secondary | ICD-10-CM | POA: Diagnosis present

## 2011-08-08 DIAGNOSIS — I252 Old myocardial infarction: Secondary | ICD-10-CM

## 2011-08-08 DIAGNOSIS — R1310 Dysphagia, unspecified: Secondary | ICD-10-CM | POA: Diagnosis present

## 2011-08-08 DIAGNOSIS — E109 Type 1 diabetes mellitus without complications: Secondary | ICD-10-CM

## 2011-08-08 DIAGNOSIS — Z95810 Presence of automatic (implantable) cardiac defibrillator: Secondary | ICD-10-CM

## 2011-08-08 DIAGNOSIS — N390 Urinary tract infection, site not specified: Secondary | ICD-10-CM | POA: Diagnosis not present

## 2011-08-08 DIAGNOSIS — N2581 Secondary hyperparathyroidism of renal origin: Secondary | ICD-10-CM | POA: Diagnosis present

## 2011-08-08 DIAGNOSIS — Z7982 Long term (current) use of aspirin: Secondary | ICD-10-CM

## 2011-08-08 DIAGNOSIS — N186 End stage renal disease: Secondary | ICD-10-CM | POA: Diagnosis present

## 2011-08-08 DIAGNOSIS — R4789 Other speech disturbances: Secondary | ICD-10-CM | POA: Diagnosis present

## 2011-08-08 DIAGNOSIS — Z951 Presence of aortocoronary bypass graft: Secondary | ICD-10-CM

## 2011-08-08 DIAGNOSIS — G20 Parkinson's disease: Secondary | ICD-10-CM

## 2011-08-08 DIAGNOSIS — R569 Unspecified convulsions: Secondary | ICD-10-CM

## 2011-08-08 DIAGNOSIS — R404 Transient alteration of awareness: Secondary | ICD-10-CM | POA: Diagnosis present

## 2011-08-08 DIAGNOSIS — R4182 Altered mental status, unspecified: Secondary | ICD-10-CM

## 2011-08-08 DIAGNOSIS — Z66 Do not resuscitate: Secondary | ICD-10-CM | POA: Diagnosis present

## 2011-08-08 DIAGNOSIS — J690 Pneumonitis due to inhalation of food and vomit: Secondary | ICD-10-CM | POA: Diagnosis present

## 2011-08-08 DIAGNOSIS — E119 Type 2 diabetes mellitus without complications: Secondary | ICD-10-CM

## 2011-08-08 DIAGNOSIS — G20A1 Parkinson's disease without dyskinesia, without mention of fluctuations: Secondary | ICD-10-CM

## 2011-08-08 DIAGNOSIS — I6529 Occlusion and stenosis of unspecified carotid artery: Secondary | ICD-10-CM | POA: Diagnosis present

## 2011-08-08 DIAGNOSIS — G912 (Idiopathic) normal pressure hydrocephalus: Secondary | ICD-10-CM

## 2011-08-08 DIAGNOSIS — D638 Anemia in other chronic diseases classified elsewhere: Secondary | ICD-10-CM | POA: Diagnosis present

## 2011-08-08 DIAGNOSIS — R29810 Facial weakness: Secondary | ICD-10-CM | POA: Diagnosis present

## 2011-08-08 DIAGNOSIS — I2589 Other forms of chronic ischemic heart disease: Secondary | ICD-10-CM | POA: Diagnosis present

## 2011-08-08 DIAGNOSIS — Z992 Dependence on renal dialysis: Secondary | ICD-10-CM

## 2011-08-08 DIAGNOSIS — I509 Heart failure, unspecified: Secondary | ICD-10-CM | POA: Diagnosis present

## 2011-08-08 DIAGNOSIS — I6350 Cerebral infarction due to unspecified occlusion or stenosis of unspecified cerebral artery: Principal | ICD-10-CM | POA: Diagnosis present

## 2011-08-08 LAB — COMPREHENSIVE METABOLIC PANEL
AST: 19 U/L (ref 0–37)
CO2: 32 mEq/L (ref 19–32)
Calcium: 10 mg/dL (ref 8.4–10.5)
Creatinine, Ser: 4.03 mg/dL — ABNORMAL HIGH (ref 0.50–1.35)
GFR calc Af Amer: 18 mL/min — ABNORMAL LOW (ref >60)
GFR calc non Af Amer: 15 mL/min — ABNORMAL LOW (ref >60)
Total Protein: 8 g/dL (ref 6.0–8.3)

## 2011-08-08 LAB — DIFFERENTIAL
Lymphocytes Relative: 13 % (ref 12–46)
Lymphs Abs: 0.8 10*3/uL (ref 0.7–4.0)
Neutrophils Relative %: 76 % (ref 43–77)

## 2011-08-08 LAB — CBC
Hemoglobin: 12.1 g/dL — ABNORMAL LOW (ref 13.0–17.0)
MCH: 29.5 pg (ref 26.0–34.0)
MCHC: 32.5 g/dL (ref 30.0–36.0)
RDW: 17.2 % — ABNORMAL HIGH (ref 11.5–15.5)

## 2011-08-08 LAB — CK TOTAL AND CKMB (NOT AT ARMC)
CK, MB: 2.2 ng/mL (ref 0.3–4.0)
Relative Index: INVALID (ref 0.0–2.5)

## 2011-08-08 LAB — SURGICAL PCR SCREEN
MRSA, PCR: POSITIVE — AB
Staphylococcus aureus: POSITIVE — AB

## 2011-08-08 LAB — POCT I-STAT TROPONIN I

## 2011-08-08 LAB — GLUCOSE, CAPILLARY: Glucose-Capillary: 224 mg/dL — ABNORMAL HIGH (ref 70–99)

## 2011-08-08 NOTE — Telephone Encounter (Signed)
[name] with Dr. Margrett Rud office called to inform the patient could not complete his pre-op this morning as he got very sick, vomiting. They are going to cancel the surgery scheduled for the 21st, the family agreed it best as well.

## 2011-08-08 NOTE — Patient Instructions (Addendum)
It was good to see you today. We have reviewed your prior records including labs and tests today we'll make referral to [facility] neurology foe evaluation of possible seizures and Parkinson's disease. Our office will contact you regarding appointment(s) once made. No driving until "spell-free" for 6 months! Keep record book of "events": time, day, sugar level, etc for review with neurologist Discuss with Dr. Everardo All if recurrent sugars under 70 as today - may need to reduce insulin dose! Please schedule followup in 6 months, call sooner if problems.

## 2011-08-08 NOTE — Telephone Encounter (Signed)
Noted and agree with plans -  Also, at OV today, pt family had asked for [facility] to provide [facility] for home use - will place order for same now - thanks

## 2011-08-08 NOTE — Progress Notes (Signed)
Subjective:    Patient ID: [name], male    DOB: 02/23/1933, 77 y.o.   MRN: [id number]  HPI  Here for follow up -  Wife notes freq episodes of "disorientation" - unable to dress self "frozen" and "unresponsive but eyes open and awake" approx 1 episode per week, variable levels of LOC -from "slow" to "frozen" No typical pattern re: time of day, no relation to meds/meals or HD Last event this AM just before OV: sat in the car despite being told to get out - symptoms typical of spell - WC into office as unable to walk or follow commands - cma check cbg>50s>given OJ> symptoms resolved, now normal No incontinence, no seziure motor activity during any spells - no dysarthria or unilateral motor deficits Recent eval with nsurg 07/19/11 - head ct ok (no recurrent NPH change since VP shunt 08/2011) ?walking "bent over and not picking feet up" - prior ?pk dz before tx for NPH 08/2009 (no longer follows with neuro)   Past Medical History  Diagnosis Date  . SLEEP APNEA, OBSTRUCTIVE     CPAP qhs  . VITAMIN B12 DEFICIENCY   . PARKINSON'S DISEASE   . CORONARY ARTERY DISEASE     s/p  CABG 2003-cath 12/31/09-occluded sap dx  graft, otherwise patent grafts  . Chronic systolic heart failure     LVEF 35-40% by cath 12/31/09 s/p BiV ICD 07/2010  . Gastroparesis   . CORONARY ARTERY BYPASS GRAFT, THREE VESSEL, HX OF 2003  . AUTOMATIC IMPLANTABLE CARDIAC DEFIBRILLATOR SITU 07/2010 BiV ICD  . ANEMIA, MACROCYTIC   . DEPRESSION   . DIABETES MELLITUS, TYPE I   . GERD   . HYPERCHOLESTEROLEMIA   . HYPERTENSION   . RENAL FAILURE, END STAGE     HD MWD a/w anemia of chronic dz, hypotension  . PERIPHERAL NEUROPATHY   . History of colonoscopy    Review of Systems  Constitutional: Negative for fever.  Respiratory: Negative for shortness of breath.   Cardiovascular: Negative for chest pain.  Gastrointestinal: Negative for abdominal pain, diarrhea, constipation and blood in stool.  Genitourinary: Negative for  flank pain.       Objective:   Physical Exam  BP 158/72  Pulse 102  Temp(Src) 98.5 F (36.9 C) (Oral)  Ht 6\' 1"  (1.854 m)  Wt 196 lb 12.8 oz (89.268 kg)  BMI 25.96 kg/m2  SpO2 99% Constitutional:  oriented to person, place, and time. appears well-developed and well-nourished. No distress. Wife/dtr at side Neck: Normal range of motion. Neck supple. No JVD present. No thyromegaly present.  Cardiovascular: Normal rate, regular rhythm and normal heart sounds.  No murmur heard. No BLE edema Pulmonary/Chest: Effort normal and breath sounds normal. No respiratory distress. no wheezes.  Neuro: AAOx3, CN2-12 symmetrically intact; MAE well - gait not tested     Wt Readings from Last 3 Encounters:  08/08/11 196 lb 12.8 oz (89.268 kg)  07/12/11 200 lb (90.719 kg)  06/22/11 208 lb (94.348 kg)   Lab Results  Component Value Date   WBC 9.0 06/30/2011   HGB 11.8* 06/30/2011   HCT 36.9* 06/30/2011   PLT 200 06/30/2011   CHOL 186 07/26/2010   TRIG 527.0* 07/26/2010   HDL 31.70* 07/26/2010   LDLDIRECT 68.2 07/26/2010   ALT 15 06/30/2011   AST 22 06/30/2011   NA 139 06/30/2011   K 4.5 06/30/2011   CL 98 06/30/2011   CREATININE 5.60* 06/30/2011   BUN 17 06/30/2011   CO2 28  06/30/2011   TSH 1.296 08/15/2010   INR 0.99 06/30/2011   HGBA1C 6.7* 05/16/2011     Assessment & Plan:  AMS, ?seizure event - event today in clinic associated with hypoglycemia (50s) but other events with normal sugar per spouse. events at home lasting minutes to hour per spouse: "frozen spell and unresponsive" -  Recent head ct 06/08/11 for VP shunt check ok - unable to have MRI due to PPM -  Will refer to neuro, no driving until further eval complete and no "spells" x 6 mo - pt/wife/dtr verbalize understanding of same and all agree Wife of keep log of events, to include time/day and cbg Also note prior ?dx of Pk pre 2009 - then changed dx to NPH and underwent VP shunt 08/2009 for same with seeming temporary improvement Defer to neuro for meds as  needed Time spent with pt/family today 30 minutes, greater than 50% time spent counseling patient on ?seizures, hypoglycemia, ?pk and medication review. Also review of prior records  Hypoglycemia/DM - event in office today with AMS - tx with OJ and cbg normalized with normalization of MS - no insulin adjuments at this time as family feels this is isolated event

## 2011-08-09 ENCOUNTER — Inpatient Hospital Stay (HOSPITAL_COMMUNITY): Payer: Medicare Other

## 2011-08-09 DIAGNOSIS — I517 Cardiomegaly: Secondary | ICD-10-CM

## 2011-08-09 LAB — LIPID PANEL
HDL: 30 mg/dL — ABNORMAL LOW (ref >39)
LDL Cholesterol: 62 mg/dL (ref 0–99)
Total CHOL/HDL Ratio: 4.4 RATIO

## 2011-08-09 LAB — CBC
HCT: 37.4 % — ABNORMAL LOW (ref 39.0–52.0)
Hemoglobin: 11.8 g/dL — ABNORMAL LOW (ref 13.0–17.0)
WBC: 14.4 10*3/uL — ABNORMAL HIGH (ref 4.0–10.5)

## 2011-08-09 LAB — GLUCOSE, CAPILLARY
Glucose-Capillary: 288 mg/dL — ABNORMAL HIGH (ref 70–99)
Glucose-Capillary: 272 mg/dL — ABNORMAL HIGH (ref 70–99)
Glucose-Capillary: 242 mg/dL — ABNORMAL HIGH (ref 70–99)

## 2011-08-09 LAB — DIFFERENTIAL
Basophils Absolute: 0 10*3/uL (ref 0.0–0.1)
Lymphocytes Relative: 3 % — ABNORMAL LOW (ref 12–46)
Neutro Abs: 13 10*3/uL — ABNORMAL HIGH (ref 1.7–7.7)

## 2011-08-09 LAB — BASIC METABOLIC PANEL
BUN: 22 mg/dL (ref 6–23)
Chloride: 95 mEq/L — ABNORMAL LOW (ref 96–112)
Glucose, Bld: 321 mg/dL — ABNORMAL HIGH (ref 70–99)
Potassium: 4.3 mEq/L (ref 3.5–5.1)

## 2011-08-09 LAB — HEPATITIS B SURFACE ANTIGEN: Hepatitis B Surface Ag: NEGATIVE

## 2011-08-09 LAB — APTT: aPTT: 31 seconds (ref 24–37)

## 2011-08-10 ENCOUNTER — Inpatient Hospital Stay (HOSPITAL_COMMUNITY): Payer: Medicare Other

## 2011-08-10 ENCOUNTER — Encounter (HOSPITAL_COMMUNITY): Payer: Self-pay | Admitting: Radiology

## 2011-08-10 LAB — GLUCOSE, CAPILLARY
Glucose-Capillary: 239 mg/dL — ABNORMAL HIGH (ref 70–99)
Glucose-Capillary: 243 mg/dL — ABNORMAL HIGH (ref 70–99)
Glucose-Capillary: 276 mg/dL — ABNORMAL HIGH (ref 70–99)
Glucose-Capillary: 278 mg/dL — ABNORMAL HIGH (ref 70–99)

## 2011-08-10 LAB — CBC
HCT: 36 % — ABNORMAL LOW (ref 39.0–52.0)
MCHC: 31.7 g/dL (ref 30.0–36.0)
MCV: 91.8 fL (ref 78.0–100.0)
Platelets: 185 10*3/uL (ref 150–400)
RDW: 17.4 % — ABNORMAL HIGH (ref 11.5–15.5)

## 2011-08-10 LAB — URINE MICROSCOPIC-ADD ON

## 2011-08-10 LAB — URINALYSIS, ROUTINE W REFLEX MICROSCOPIC
Ketones, ur: 15 mg/dL — AB
Nitrite: NEGATIVE
Specific Gravity, Urine: 1.022 (ref 1.005–1.030)
pH: 8.5 — ABNORMAL HIGH (ref 5.0–8.0)

## 2011-08-10 LAB — COMPREHENSIVE METABOLIC PANEL
AST: 18 U/L (ref 0–37)
Albumin: 3.5 g/dL (ref 3.5–5.2)
BUN: 24 mg/dL — ABNORMAL HIGH (ref 6–23)
Creatinine, Ser: 3.67 mg/dL — ABNORMAL HIGH (ref 0.50–1.35)
Potassium: 4.4 mEq/L (ref 3.5–5.1)
Total Protein: 7.4 g/dL (ref 6.0–8.3)

## 2011-08-10 LAB — AMMONIA: Ammonia: 11 umol/L (ref 11–60)

## 2011-08-10 MED ORDER — IOHEXOL 350 MG/ML SOLN: 75.0000 mL | Freq: Once | INTRAVENOUS | Status: AC | PRN

## 2011-08-10 MED ORDER — IOHEXOL 350 MG/ML SOLN
80.0000 mL | Freq: Once | INTRAVENOUS | Status: AC | PRN
  Administered 2011-08-10: 80 mL via INTRAVENOUS

## 2011-08-10 NOTE — H&P (Signed)
[name], [name]                  ACCOUNT NO.:  [id number]  MEDICAL RECORD NO.:  [id number]  LOCATION:                                 FACILITY:  PHYSICIAN:  Jeoffrey Massed, MD    DATE OF BIRTH:  1933-07-05  DATE OF ADMISSION: DATE OF DISCHARGE:                             HISTORY & PHYSICAL   PRIMARY CARE PRACTITIONER:  Vikki Ports A. Felicity Coyer, MD  PRIMARY CARDIOLOGIST:  Doylene Canning. Ladona Ridgel, MD  PRIMARY NEPHROLOGIST:  Zetta Bills, MD  PRIMARY NEUROSURGEON:  Donalee Citrin, MD  CHIEF COMPLAINT:  Altered mental status with slurring of speech and possible left-sided weakness.  HISTORY OF PRESENT ILLNESS:  [name] is a 77 year old white male with an extensive past medical history of end-stage renal disease on hemodialysis on Mondays, Wednesdays, Fridays, history of chronic systolic heart failure status post ICD placement, history of normal pressure hydrocephalous status post VP shunt, hypertension, diabetes along with coronary artery disease status post CABG was brought to the hospital for the above-noted complaints.  Apparently, this patient has been having issues with confusion for the past 6 weeks and the family claims he has been intermittently altered as well.  As noted, this has been going on for the past 6 weeks, however, in between the periods where the patient is altered, he does have periods where he is completely normal at his usual baseline.  The patient does have generalized weakness and the family claims that he does have difficulty in getting up of the chair and ambulating.  Today, they noticed that the patient's weakness was a little bit much more than usual and the family also noticed that he had a left facial droop and his speech was very slurry and as a result, they took him to his primary care's office today as they had a previously scheduled appointment and was then promptly referred to the ED for further evaluation.  In the ED, the patient was noted to have  left-sided facial droop and dysarthria and left-sided weakness, and the hospitalist service was asked to admit this patient. The patient was not considered a tPA candidate as the time of onset of symptoms is currently unknown and given the patient's complicated history of intermittent confusion for the past few weeks.  Also please note per family, although the patient has been having spells of confusion and altered mental status, his speech was mostly clear during those episodes and he never had a left facial droop during those episodes.  Per family, the left facial droop and dysarthria were noted today.  The hospitalist service has now been asked to admit this patient for further evaluation and treatment.  The patient's family denies any headache, nausea, vomiting, diarrhea.  There is also no history of abdominal pain.  The patient is somewhat lethargic but easily arousable and is able to follow some commands after asking the patient repeatedly.  ALLERGIES:  Questionable allergy to PROCARDIA and ADHESIVE TAPE and questionable allergy to OXYCODONE.PAST MEDICAL HISTORY: 1. Coronary artery disease status post CABG. 2. History of ischemic cardiomyopathy with underlying chronic systolic     heart failure. 3. End-stage renal disease, on hemodialysis on Mondays, Wednesdays,  and Fridays. 4. History of obstructive sleep apnea. 5. History of insulin-dependent diabetes mellitus. 6. Anemia of chronic disease. 7. History of normal-pressure hydrocephalus status post VP shunt. 8. History of depression. 9. History of gastroesophageal reflux disease.  PAST SURGICAL HISTORY: 1. CABG in 2003. 2. AV fistula placement. 3. VP shunt placed. 4. Defibrillator implantation.  MEDICATIONS PRIOR TO ADMISSION:  These are not clear, however, the patient's family claims that the patient is on Lipitor at an unknown dose, Humulin injections, aspirin, Ranexa, Toprol-XL, Effient, Nexium. Please note that  these above medications are unverified.  FAMILY HISTORY:  Mother died of CVA at 80.  Father died of lung CA at 9.  He has brothers and sisters with coronary artery disease as well.  SOCIAL HISTORY:  He lives with his wife.  Denies any toxic habits.  REVIEW OF SYSTEMS:  A detailed review of 12 systems was done and these are negative except for the ones noted in the HPI.  PHYSICAL EXAMINATION:  VITAL SIGNS:  Initial vital signs showed a temperature of 98.7, heart rate of 108, blood pressure of 210/85, respirations of 20, pulse ox of 95% on room air. Last set of vital signs shows a blood pressure of 168/92. GENERAL:  Lying in bed, obvious left facial droop with dysarthria, somewhat arousable after sternal rub, somewhat awake and only following some commands.  Speech is dysarthric. HEENT:  Atraumatic, normocephalic.  Pupils equally reactive to light and accommodation. NECK:  Supple. CHEST:  Currently bilaterally clear to auscultation. CARDIOVASCULAR:  Heart sounds are regular.  No murmurs heard. ABDOMEN:  Soft, nontender, nondistended. EXTREMITIES:  No edema. NEUROLOGIC:  Difficult to examine because the patient is still very lethargic and is intermittently confused and is only following some commands; however, he does appear to have left facial droop.  He does appear to have a significant amount of dysarthria.  He has around 3-4/5 strength in his left upper extremity and around 3/5 strength in his left lower extremity.  He has around 5/5 strength in his right upper and lower extremities.  LABORATORY STUDIES: 1. Point-of-care cardiac markers are negative. 2. CBC shows a WBC of 6.1, hemoglobin of 12.1, hematocrit of 37.2, and     platelet count of 199. 3. Chemistry shows a sodium of 139, potassium of 4.2, chloride of 94,     bicarb of 32, glucose of 202, BUN of 16, creatinine of 4.03, and a     calcium of 10.0. 4. LFT shows a total bilirubin of 0.6, alkaline phosphatase of 28,  AST     of 19, ALT of 17, total protein of 8.0, and albumin of 4.2. 5. Troponin is less than 0.30.  RADIOLOGICAL STUDIES:  CT of the head without contrast showed no evidence of acute intracranial abnormality.  Stable ventriculomegaly with right parietal approach, ventriculostomy catheter.  ASSESSMENT: 1. Acute cerebrovascular accident with left-sided deficits as noted     above. 2. Hypertension, moderately well controlled. 3. History of end-stage renal disease, on hemodialysis Mondays,     Wednesdays, Fridays. 4. History of chronic systolic heart failure, currently compensated. 5. History of diabetes. 6. History of normal-pressure hydrocephalus status post     ventriculoperitoneal shunting. 7. History of coronary artery disease status post coronary artery     bypass graft.  PLAN: 1. The patient will be admitted to neuro telemetry unit. 2. Since the patient has a defibrillator in place, we will not be able     to do an MRI.  As  a result, we will repeat a CT of the head in the     morning. 3. Carotid Dopplers and 2-D echocardiogram will be obtained. 4. The patient has had the stroke while on numerous antiplatelet     agents which include Effient, aspirin, and has failed his bedside     swallow screen.  As a result for now, we will place him place him     on aspirin that will be given rectally. 5. Neurology will also evaluate this patient later today. 6. DVT prophylaxis will be done with heparin. 7. Code status DNI/DNR. 8. Given multiple medical problems, I doubt if we could offer this     patient aggressive therapy and he does carry overall poor     prognosis.  All of this has been explained to the patient's family     in great detail by me.  Please also note that the patient's DNR     status was confirmed with the patient's spouse and the patient's     daughter and son at bedside as well. 9. A formal swallowing study along with PT, OT will be obtained in the      morning. 10.Further plan will depend as this patient's clinical course evolves     and input is obtained from Neurology as well.  Total time spent 50 minutes.     Jeoffrey Massed, MD     SG/MEDQ  D:  08/08/2011  T:  08/08/2011  Job:  [id number]  cc:   Vikki Ports A. Felicity Coyer, MD Zetta Bills, MD Doylene Canning. Ladona Ridgel, MD  Electronically Signed by Jeoffrey Massed  on 08/10/2011 03:54:25 PM

## 2011-08-11 ENCOUNTER — Inpatient Hospital Stay (HOSPITAL_COMMUNITY): Payer: Medicare Other

## 2011-08-11 DIAGNOSIS — I6350 Cerebral infarction due to unspecified occlusion or stenosis of unspecified cerebral artery: Secondary | ICD-10-CM

## 2011-08-11 DIAGNOSIS — R509 Fever, unspecified: Secondary | ICD-10-CM

## 2011-08-11 LAB — CSF CELL COUNT WITH DIFFERENTIAL: Tube #: 3

## 2011-08-11 LAB — PROTEIN AND GLUCOSE, CSF
Glucose, CSF: 152 mg/dL — ABNORMAL HIGH (ref 43–76)
Total  Protein, CSF: 153 mg/dL — ABNORMAL HIGH (ref 15–45)

## 2011-08-11 LAB — CBC
HCT: 32.8 % — ABNORMAL LOW (ref 39.0–52.0)
Hemoglobin: 10.4 g/dL — ABNORMAL LOW (ref 13.0–17.0)
MCH: 29.1 pg (ref 26.0–34.0)
RBC: 3.57 MIL/uL — ABNORMAL LOW (ref 4.22–5.81)

## 2011-08-11 LAB — RENAL FUNCTION PANEL
CO2: 29 mEq/L (ref 19–32)
Calcium: 9.3 mg/dL (ref 8.4–10.5)
GFR calc Af Amer: 14 mL/min — ABNORMAL LOW (ref >60)
Glucose, Bld: 248 mg/dL — ABNORMAL HIGH (ref 70–99)
Sodium: 140 mEq/L (ref 135–145)

## 2011-08-11 LAB — URINE CULTURE
Culture  Setup Time: 201208161818
Culture: NO GROWTH

## 2011-08-11 LAB — GLUCOSE, CAPILLARY: Glucose-Capillary: 263 mg/dL — ABNORMAL HIGH (ref 70–99)

## 2011-08-12 ENCOUNTER — Inpatient Hospital Stay (HOSPITAL_COMMUNITY): Payer: Medicare Other

## 2011-08-12 LAB — GLUCOSE, CAPILLARY
Glucose-Capillary: 268 mg/dL — ABNORMAL HIGH (ref 70–99)
Glucose-Capillary: 264 mg/dL — ABNORMAL HIGH (ref 70–99)

## 2011-08-12 LAB — HIV ANTIBODY (ROUTINE TESTING W REFLEX): HIV: NONREACTIVE

## 2011-08-13 ENCOUNTER — Inpatient Hospital Stay (HOSPITAL_COMMUNITY): Payer: Medicare Other

## 2011-08-13 DIAGNOSIS — I6330 Cerebral infarction due to thrombosis of unspecified cerebral artery: Secondary | ICD-10-CM | POA: Insufficient documentation

## 2011-08-13 DIAGNOSIS — R509 Fever, unspecified: Secondary | ICD-10-CM

## 2011-08-13 DIAGNOSIS — I6350 Cerebral infarction due to unspecified occlusion or stenosis of unspecified cerebral artery: Secondary | ICD-10-CM

## 2011-08-13 LAB — GLUCOSE, CAPILLARY
Glucose-Capillary: 224 mg/dL — ABNORMAL HIGH (ref 70–99)
Glucose-Capillary: 310 mg/dL — ABNORMAL HIGH (ref 70–99)

## 2011-08-13 LAB — RENAL FUNCTION PANEL
Albumin: 3.6 g/dL (ref 3.5–5.2)
BUN: 50 mg/dL — ABNORMAL HIGH (ref 6–23)
CO2: 25 mEq/L (ref 19–32)
GFR calc non Af Amer: 9 mL/min — ABNORMAL LOW (ref >60)
Glucose, Bld: 243 mg/dL — ABNORMAL HIGH (ref 70–99)
Phosphorus: 5.2 mg/dL — ABNORMAL HIGH (ref 2.3–4.6)
Potassium: 3.7 mEq/L (ref 3.5–5.1)
Sodium: 142 mEq/L (ref 135–145)

## 2011-08-13 LAB — CBC
HCT: 30.7 % — ABNORMAL LOW (ref 39.0–52.0)
Hemoglobin: 9.8 g/dL — ABNORMAL LOW (ref 13.0–17.0)
MCHC: 31.9 g/dL (ref 30.0–36.0)
RBC: 3.36 MIL/uL — ABNORMAL LOW (ref 4.22–5.81)

## 2011-08-14 ENCOUNTER — Inpatient Hospital Stay (HOSPITAL_COMMUNITY): Payer: Medicare Other

## 2011-08-14 ENCOUNTER — Encounter: Payer: Self-pay | Admitting: Neurology

## 2011-08-14 LAB — BASIC METABOLIC PANEL
BUN: 76 mg/dL — ABNORMAL HIGH (ref 6–23)
CO2: 26 mEq/L (ref 19–32)
Calcium: 9.8 mg/dL (ref 8.4–10.5)
Creatinine, Ser: 7.94 mg/dL — ABNORMAL HIGH (ref 0.50–1.35)
Glucose, Bld: 272 mg/dL — ABNORMAL HIGH (ref 70–99)

## 2011-08-14 LAB — GLUCOSE, CAPILLARY
Glucose-Capillary: 244 mg/dL — ABNORMAL HIGH (ref 70–99)
Glucose-Capillary: 258 mg/dL — ABNORMAL HIGH (ref 70–99)
Glucose-Capillary: 273 mg/dL — ABNORMAL HIGH (ref 70–99)
Glucose-Capillary: 312 mg/dL — ABNORMAL HIGH (ref 70–99)
Glucose-Capillary: 385 mg/dL — ABNORMAL HIGH (ref 70–99)

## 2011-08-14 LAB — CBC
MCH: 29 pg (ref 26.0–34.0)
MCV: 91.3 fL (ref 78.0–100.0)
Platelets: 209 10*3/uL (ref 150–400)
RBC: 3.9 MIL/uL — ABNORMAL LOW (ref 4.22–5.81)

## 2011-08-15 ENCOUNTER — Telehealth: Payer: Self-pay

## 2011-08-15 ENCOUNTER — Ambulatory Visit (HOSPITAL_COMMUNITY): Admission: RE | Admit: 2011-08-15 | Payer: Medicare Other | Source: Ambulatory Visit | Admitting: Urology

## 2011-08-15 ENCOUNTER — Inpatient Hospital Stay (HOSPITAL_COMMUNITY): Payer: Medicare Other

## 2011-08-15 ENCOUNTER — Other Ambulatory Visit (HOSPITAL_COMMUNITY): Payer: Medicare Other

## 2011-08-15 ENCOUNTER — Encounter: Payer: Medicare Other | Admitting: Internal Medicine

## 2011-08-15 LAB — BASIC METABOLIC PANEL
CO2: 27 mEq/L (ref 19–32)
Chloride: 98 mEq/L (ref 96–112)
Glucose, Bld: 256 mg/dL — ABNORMAL HIGH (ref 70–99)
Potassium: 4.7 mEq/L (ref 3.5–5.1)
Sodium: 140 mEq/L (ref 135–145)

## 2011-08-15 LAB — CULTURE, BLOOD (ROUTINE X 2)
Culture  Setup Time: 201208152030
Culture: NO GROWTH

## 2011-08-15 LAB — CBC
Hemoglobin: 12.3 g/dL — ABNORMAL LOW (ref 13.0–17.0)
RBC: 4.19 MIL/uL — ABNORMAL LOW (ref 4.22–5.81)
WBC: 7.2 10*3/uL (ref 4.0–10.5)

## 2011-08-15 LAB — GLUCOSE, CAPILLARY
Glucose-Capillary: 261 mg/dL — ABNORMAL HIGH (ref 70–99)
Glucose-Capillary: 211 mg/dL — ABNORMAL HIGH (ref 70–99)
Glucose-Capillary: 211 mg/dL — ABNORMAL HIGH (ref 70–99)
Glucose-Capillary: 221 mg/dL — ABNORMAL HIGH (ref 70–99)

## 2011-08-15 LAB — CSF CULTURE W GRAM STAIN: Culture: NO GROWTH

## 2011-08-15 NOTE — Progress Notes (Signed)
Addended by: Rene Paci A on: 08/15/2011 12:35 PM   Modules accepted: Orders

## 2011-08-15 NOTE — Telephone Encounter (Signed)
Pt's daughter called to inform MD that pt has been admitted to [facility]. Daughter is also requesting Neurology referral be cancelled.

## 2011-08-15 NOTE — Telephone Encounter (Signed)
Noted - will cancel order - thanks

## 2011-08-16 ENCOUNTER — Inpatient Hospital Stay (HOSPITAL_COMMUNITY): Payer: Medicare Other

## 2011-08-16 LAB — CBC
MCH: 29.1 pg (ref 26.0–34.0)
MCV: 90.7 fL (ref 78.0–100.0)
Platelets: 208 10*3/uL (ref 150–400)
RBC: 3.98 MIL/uL — ABNORMAL LOW (ref 4.22–5.81)
RDW: 16.8 % — ABNORMAL HIGH (ref 11.5–15.5)
WBC: 6.8 10*3/uL (ref 4.0–10.5)

## 2011-08-16 LAB — RENAL FUNCTION PANEL
Albumin: 3.6 g/dL (ref 3.5–5.2)
CO2: 24 mEq/L (ref 19–32)
Calcium: 10 mg/dL (ref 8.4–10.5)
Creatinine, Ser: 7.39 mg/dL — ABNORMAL HIGH (ref 0.50–1.35)
GFR calc Af Amer: 9 mL/min — ABNORMAL LOW (ref >60)
GFR calc non Af Amer: 7 mL/min — ABNORMAL LOW (ref >60)
Sodium: 143 mEq/L (ref 135–145)

## 2011-08-16 LAB — CULTURE, BLOOD (ROUTINE X 2)
Culture  Setup Time: 201208161500
Culture: NO GROWTH

## 2011-08-16 LAB — GLUCOSE, CAPILLARY
Glucose-Capillary: 219 mg/dL — ABNORMAL HIGH (ref 70–99)
Glucose-Capillary: 212 mg/dL — ABNORMAL HIGH (ref 70–99)
Glucose-Capillary: 172 mg/dL — ABNORMAL HIGH (ref 70–99)

## 2011-08-17 ENCOUNTER — Ambulatory Visit: Payer: Medicare Other | Admitting: Endocrinology

## 2011-08-17 LAB — GLUCOSE, CAPILLARY
Glucose-Capillary: 165 mg/dL — ABNORMAL HIGH (ref 70–99)
Glucose-Capillary: 210 mg/dL — ABNORMAL HIGH (ref 70–99)
Glucose-Capillary: 210 mg/dL — ABNORMAL HIGH (ref 70–99)

## 2011-08-18 ENCOUNTER — Inpatient Hospital Stay (HOSPITAL_COMMUNITY): Payer: Medicare Other

## 2011-08-18 LAB — GLUCOSE, CAPILLARY
Glucose-Capillary: 200 mg/dL — ABNORMAL HIGH (ref 70–99)
Glucose-Capillary: 176 mg/dL — ABNORMAL HIGH (ref 70–99)

## 2011-08-18 LAB — POCT I-STAT 4, (NA,K, GLUC, HGB,HCT)
Hemoglobin: 12.9 g/dL — ABNORMAL LOW (ref 13.0–17.0)
Potassium: 3.2 mEq/L — ABNORMAL LOW (ref 3.5–5.1)
Sodium: 140 mEq/L (ref 135–145)

## 2011-08-19 LAB — GLUCOSE, CAPILLARY: Glucose-Capillary: 201 mg/dL — ABNORMAL HIGH (ref 70–99)

## 2011-08-19 LAB — BASIC METABOLIC PANEL
CO2: 25 mEq/L (ref 19–32)
Calcium: 9.4 mg/dL (ref 8.4–10.5)
Creatinine, Ser: 6.7 mg/dL — ABNORMAL HIGH (ref 0.50–1.35)
GFR calc non Af Amer: 8 mL/min — ABNORMAL LOW (ref >60)
Glucose, Bld: 145 mg/dL — ABNORMAL HIGH (ref 70–99)
Sodium: 139 mEq/L (ref 135–145)

## 2011-08-19 LAB — PHOSPHORUS: Phosphorus: 8.3 mg/dL — ABNORMAL HIGH (ref 2.3–4.6)

## 2011-08-19 LAB — CBC
MCH: 28.9 pg (ref 26.0–34.0)
MCHC: 32.6 g/dL (ref 30.0–36.0)
MCV: 88.6 fL (ref 78.0–100.0)
Platelets: 144 10*3/uL — ABNORMAL LOW (ref 150–400)
RBC: 3.6 MIL/uL — ABNORMAL LOW (ref 4.22–5.81)

## 2011-08-19 LAB — MAGNESIUM: Magnesium: 2.5 mg/dL (ref 1.5–2.5)

## 2011-08-20 LAB — CBC
MCH: 28.8 pg (ref 26.0–34.0)
MCHC: 32.9 g/dL (ref 30.0–36.0)
MCV: 87.6 fL (ref 78.0–100.0)
Platelets: 156 10*3/uL (ref 150–400)
RBC: 3.54 MIL/uL — ABNORMAL LOW (ref 4.22–5.81)

## 2011-08-20 LAB — GLUCOSE, CAPILLARY
Glucose-Capillary: 241 mg/dL — ABNORMAL HIGH (ref 70–99)
Glucose-Capillary: 334 mg/dL — ABNORMAL HIGH (ref 70–99)
Glucose-Capillary: 375 mg/dL — ABNORMAL HIGH (ref 70–99)
Glucose-Capillary: 307 mg/dL — ABNORMAL HIGH (ref 70–99)
Glucose-Capillary: 438 mg/dL — ABNORMAL HIGH (ref 70–99)

## 2011-08-20 LAB — RENAL FUNCTION PANEL
CO2: 24 mEq/L (ref 19–32)
Calcium: 9.6 mg/dL (ref 8.4–10.5)
Creatinine, Ser: 6.99 mg/dL — ABNORMAL HIGH (ref 0.50–1.35)
Glucose, Bld: 188 mg/dL — ABNORMAL HIGH (ref 70–99)
Phosphorus: 8.2 mg/dL — ABNORMAL HIGH (ref 2.3–4.6)
Sodium: 138 mEq/L (ref 135–145)

## 2011-08-21 ENCOUNTER — Inpatient Hospital Stay (HOSPITAL_COMMUNITY): Payer: Medicare Other

## 2011-08-21 LAB — CBC
MCH: 29.1 pg (ref 26.0–34.0)
MCHC: 33.2 g/dL (ref 30.0–36.0)
Platelets: 174 10*3/uL (ref 150–400)
Platelets: 150 10*3/uL (ref 150–400)
RBC: 3.68 MIL/uL — ABNORMAL LOW (ref 4.22–5.81)
RDW: 16.4 % — ABNORMAL HIGH (ref 11.5–15.5)
WBC: 7.7 10*3/uL (ref 4.0–10.5)

## 2011-08-21 LAB — DIFFERENTIAL
Basophils Absolute: 0.1 10*3/uL (ref 0.0–0.1)
Basophils Relative: 1 % (ref 0–1)
Eosinophils Absolute: 0.1 10*3/uL (ref 0.0–0.7)
Monocytes Relative: 6 % (ref 3–12)
Neutrophils Relative %: 81 % — ABNORMAL HIGH (ref 43–77)

## 2011-08-21 LAB — GLUCOSE, CAPILLARY
Glucose-Capillary: 191 mg/dL — ABNORMAL HIGH (ref 70–99)
Glucose-Capillary: 182 mg/dL — ABNORMAL HIGH (ref 70–99)

## 2011-08-21 LAB — RENAL FUNCTION PANEL
Albumin: 3.1 g/dL — ABNORMAL LOW (ref 3.5–5.2)
GFR calc Af Amer: 10 mL/min — ABNORMAL LOW (ref >60)
GFR calc non Af Amer: 9 mL/min — ABNORMAL LOW (ref >60)
Phosphorus: 7.3 mg/dL — ABNORMAL HIGH (ref 2.3–4.6)
Potassium: 4 mEq/L (ref 3.5–5.1)
Sodium: 136 mEq/L (ref 135–145)

## 2011-08-22 ENCOUNTER — Ambulatory Visit: Payer: Medicare Other | Admitting: Neurology

## 2011-08-22 ENCOUNTER — Inpatient Hospital Stay (HOSPITAL_COMMUNITY): Payer: Medicare Other

## 2011-08-22 LAB — DIFFERENTIAL
Eosinophils Relative: 2 % (ref 0–5)
Lymphocytes Relative: 27 % (ref 12–46)
Lymphs Abs: 1.7 10*3/uL (ref 0.7–4.0)
Neutrophils Relative %: 59 % (ref 43–77)

## 2011-08-22 LAB — CBC
HCT: 30 % — ABNORMAL LOW (ref 39.0–52.0)
MCV: 88.5 fL (ref 78.0–100.0)
RBC: 3.39 MIL/uL — ABNORMAL LOW (ref 4.22–5.81)
WBC: 6.3 10*3/uL (ref 4.0–10.5)

## 2011-08-22 LAB — GLUCOSE, CAPILLARY
Glucose-Capillary: 81 mg/dL (ref 70–99)
Glucose-Capillary: 172 mg/dL — ABNORMAL HIGH (ref 70–99)
Glucose-Capillary: 214 mg/dL — ABNORMAL HIGH (ref 70–99)
Glucose-Capillary: 240 mg/dL — ABNORMAL HIGH (ref 70–99)

## 2011-08-23 ENCOUNTER — Inpatient Hospital Stay (HOSPITAL_COMMUNITY): Payer: Medicare Other

## 2011-08-23 NOTE — Procedures (Unsigned)
REFERRING PHYSICIAN:  Dr. Cleotis Lema  HISTORY:  A 77 year old man admitted for altered mental status known to have the right-sided pain stroke EEG for evaluation.  MEDICATIONS:  Heparin, clonidine, aspirin, Keppra, Toprol, Isordil, Lantus, NovoLog, Protonix, Plavix, and Zofran.  EEG DURATION:  20.5 minutes of EEG recording.  EEG DESCRIPTION:  This is a routine 16 channel adult EEG recording with one channel devoted to limited EKG recording.  Activation procedure is performed during the photic stimulation.  The study is performed in awake state.  As the EEG opens up, I noticed that the background rhythm is asymmetrical with relatively preserved 8 Hz frequency on the left hemisphere and delta range low amplitude slowing on the right hemisphere.  No driving was noted with the photic stimulation in posterior leads.  No electrographic seizures or epileptiform discharges are recorded during the study.  No sleep architecture is evident on the study either as the patient did not get drowsy during the study.  EEG INTERPRETATION:  This is an abnormal EEG due to the presence of background asymmetry.  However, this does correlate with the patient's known history of right hemispheric stroke with the expected right-sided hemispheric background slowing as compared to the normal left side rhythm.  No electrographic seizures or epileptiform discharges are recorded during the study.          ______________________________ Levie Heritage, MD    GE:XBMW D:  08/22/2011 15:59:18  T:  08/22/2011 22:07:29  Job #:  [id number]

## 2011-08-26 NOTE — Discharge Summary (Signed)
[name], TRAMELL NO.:  [id number]  MEDICAL RECORD NO.:  [id number]  LOCATION:  4527                         FACILITY:  [facility]  PHYSICIAN:  Isidor Holts, M.D.  DATE OF BIRTH:  [birth year]  DATE OF ADMISSION:  08/08/2011 DATE OF DISCHARGE:  08/23/2011                              DISCHARGE SUMMARY   PRIMARY MD:  Raenette Rover. Felicity Coyer, MD  PRIMARY CARDIOLOGIST:  Doylene Canning. Ladona Ridgel, MD  PRIMARY NEPHROLOGIST:  Zetta Bills, MD  PRIMARY NEUROSURGEON:  Donalee Citrin, MD  DISCHARGE DIAGNOSES: 1. Right brain cerebrovascular accident, with residual left hemipareses     and dysphagia. 2. Aspiration pneumonia, secondary to cerebrovascular accident. 3. End-stage renal disease on hemodialysis Mondays, Wednesdays and     Fridays. 4. Coronary artery disease, status post CABG. 5. Ischemic cardiomyopathy/systolic congestive heart failure, status     post AICD. 6. History of normal-pressure hydrocephalus, status post     ventriculoperitoneal shunt. 7. Obstructive sleep apnea syndrome. 8. Insulin-requiring type 2 diabetes mellitus. 9. Anemia of chronic disease. 10.History of depression. 11.Delirium.  DISCHARGE MEDICATIONS: 1. Clopidogrel 75 mg p.o. daily with meals. 2. Aranesp 200 mcg IV on Wednesdays, on hemodialysis. 3. Ferric gluconate 125 mcg IV on Wednesdays with hemodialysis. 4. NovoLog insulin per sliding scale as follows for CBG 70-120 no     insulin, CBG 121-150 three units, CBG 151-200 four units, CBG 201-     250 seven units, CBG 251-300 eleven units, CBG 301-350 fifteen     units, CBG 351-400 twenty units. 5. Lantus insulin 20 units subcutaneously at bedtime. 6. Isosorbide dinitrate 20 mg p.o. t.i.d. 7. Keppra 500 mg p.o. b.i.d. 8. Nepro with carb vanilla liquid nutritional supplement 237 mL p.o.     t.i.d. p.r.n. 9. MiraLax 170 g p.o. daily. 10.Pro-Stat low carb liquid protein supplement 13 ounces p.o. b.i.d. 11.Aspirin enteric-coated 325 mg p.o.  evening. 12.PhosLo 667 mg p.o. t.i.d. with meals. 13.Ambien 5 mg p.o. p.r.n. at bedtime for insomnia. 14.Tylenol 650 mg p.o. p.r.n. q.6 h. for pain. 15.Docusate 50 mg p.o. p.r.n. daily for constipation. 16.Desonide lotion 0.05% one application topically to head daily. 17.Lipitor 20 mg p.o. at bedtime. 18.Nepro shake 237 mL p.o. daily at bedtime. 19.Nitroglycerin 0.4 mg sublingually p.r.n. every 5 minutes for chest     pain x3 doses. 20.Nephro-Vite renal vitamin 1 tablet p.o. q.a.m. 21.Nexium 40 mg p.o. b.i.d. with meals. 22.Phenergan 12.5 mg p.o. p.r.n. q.6 h. for nausea. 23.Ranexa 500 mg p.o. every evening. 24.Sarna anti-itch lotion OTC 1 application p.r.n. daily for itching. 25.Toprol XL 50 mg p.o. at bedtime. 26.Tramadol 50 mg p.o. p.r.n. q.6 h. for pain. 27.Diplomax 1 application topically b.i.d. 28.Betamethasone dipropionate at 0.05% cream one application topically     daily.  Note:  Imdur, Humulin N, Humalog, lispro, citalopram, prasugrel and     oxycodone have all been discontinued, until reviewed by MD.  PROCEDURES: 1. Head CT scan August 08, 2011, this showed no evidence of acute     intracranial abnormality.  There was stable ventriculomegaly with     right parietal approach ventriculostomy catheter.  Small vessel     ischemic changes with intracranial atherosclerosis. 2. Chest  x-ray August 09, 2011, this showed low lung volumes with new     bibasilar atelectasis and possible edema, aspiration cannot be     excluded. 3. Repeat chest x-ray August 09, 2011, this showed worsening aeration,     increasing diffuse airspace disease, particularly on the right. 4. Chest x-ray August 10, 2011, this showed improved lung aeration     with resolving pulmonary edema. 5. CT angiogram of the neck on August 10, 2011, this showed blocked     calcified plaque at the right ICA origin resulting in stenosis of     up to 65% risk with respect to distal vessel.  There was mild left      carotid atherosclerosis without hemodynamically significant     stenosis atherosclerosis at the dominant left vertebral artery or     origin without hemodynamically significant stenosis, patchy     airspace opacity in the posterior right lung.  Recommend     correlation with chest imaging. 6. CT angiogram of the head on August 10, 2011, this showed no     intracranial hemodynamically significant stenosis of major branch     occlusion with mild to moderate ICA siphon atherosclerosis, mild     distal vertebral artery atherosclerosis, stable ventriculomegaly,     stable right posterior approach.  CSF shunt features, no new     intracranial abnormality. 7. X-ray abdomen August 10, 2011, this showed enteric tube tip     overlying the left upper quadrant, advancement approximately 10 cm     could be performed to ensure location within the mid body of the     stomach as indicated. 8. Modified barium swallow examination August 12, 2011, this showed     severe oropharyngeal dysphagia.  Recommended continued temporary     means of nutrition and repeated MBS in 3 to 5 days with clinical     improvement. 9. Chest x-ray August 13, 2011, this showed cardiomegaly and mild     edema.  Feeding tube in place. 10.Abdominal x-ray August 13, 2011, this showed feeding tube to the     level of distal stomach, nonobstructive bowel gas pattern. 11.Head CT scan August 14, 2011, this showed no acute intracranial     pathology.  There was persistent hydrocephalus.  This appears     stable for the prior study and the right parietal     ventriculoperitoneal shunt is seen in unchanged position.  There     was diffuse small vessel ischemic microangiopathy and stable mild     encephalomalacia in the left frontal lobe. 12.Chest x-ray August 20, 2011, this showed no acute finding, negative     for aspiration. 13.Abdominal x-ray August 15, 2011, this showed small amount of     residual contrast in the right side of the  colon, small amount of     contrast in the urinary bladder, degenerative disk disease,     degenerative spondylosis. 14.Abdominal x-ray August 16, 2011, no dense perimetry left, possible     small amount of residual contrast in the transverse colon and the     rectum. 15.A 2-D echocardiogram August 09, 2011, this showed normal left     ventricular cavity size, wall thickness increased in pattern and     mild LV systolic function was normal.  Estimated ejection fraction     55% to 60%.  Regional wall motion abnormalities cannot be excluded.     There was a calcified mitral valve annulus. 16.Transesophageal  echocardiogram August 14, 2011 performed by Dr.     Kristeen Miss, cardiologist..  This was a limited left ventricular     echo, normal LV function, trivial regurgitation.  Procedure was     abandoned secondary to desaturation. 17.EEG August 22, 2011, this was an abnormal EEG due to present of     background asymmetry.  However, this does correlate with patient's     known history of right hemispheric stroke with suspected right-     handed hemispheric background slowing as compared to normal left     sided and no electrographic seizures or epileptiform discharges are     recorded during the study.  CONSULTATIONS: 1. Dr. Paulette Blanch Dam, Infectious Disease specialist. 2. Dr. Edsel Petrin, Palliative Care Medicine. 3. Dr. Kristeen Miss, cardiologist.  ADMISSION HISTORY:  As in H and P notes of August 09, 2011 dictated by Dr. Caryl Comes.  However, in brief, this is a 77 year old male, with known history of coronary artery disease status post CABG, ischemic cardiomyopathy, systolic congestive heart failure status post AICD, end- stage renal disease on hemodialysis Mondays, Wednesdays and Fridays, obstructive sleep apnea syndrome, insulin-requiring type 2 diabetes mellitus, anemia of chronic disease, normal-pressure hydrocephalus status post VP shunt, depression, GERD,  presenting with altered mental status associated with slowing of speech and left-sided weakness.  It does appear that in the past 6 weeks, however, the patient has been having intermittent confusion.  The patient was seen at his primary MD's office and after initial evaluation, was referred to the emergency department, where he was found to have a left-sided facial droop, dysarthria and left-sided weakness.  He was admitted for further evaluation, investigation and management.  CLINICAL COURSE: 1. CVA.  The patient presented as described above.  He was deemed not     to be a candidate for t-PA, as time of onset of symptoms was unclear.     Head CT scan showed no evidence of acute intracranial pathology and     unfortunately brain MRI could not be done, secondary to the     patient's AICD.  Dr. Delia Heady provided neurologic consultation.     Recommended head and neck CT angiogram.  For details of findings,     refer to procedure list above.  The patient was placed on Plavix     and aspirin.  TEE was done on August 14, 2011, but had to be     aborted because of hypopnea/desaturation, after which the patient     spent period of time in the neuro ICU.  Initially, he had a     dysphagia, required feeding via tube, was evaluated by speech     pathologist at least twice during the course of this hospitalization     and he has finally been recommended dysphagia 2 diet with nectar-     thick liquids, which he appears to be tolerating well.  2. Aspiration pneumonia.  The patient did have on imaging studies,     airspace disease consistent with aspiration.  There was also an     indication of possible pulmonary edema.  Infectious Disease     consultation was requested for possible infective etiology for the     patient's altered mental status and presentation.  This was kindly     provided by Dr. Paulette Blanch Dam.  The patient underwent septic     workup, was placed on an empiric combination  of vancomycin and  cefepime, and even underwent lumbar puncture, all of which were     negative for infective pathology.  He had no recorded episodes of     pyrexia.  Antibiotics were subsequently discontinued without any     deleterious effects.  The patient's x-ray of August 14, 2011, showed     no evidence of airspace disease.  3. End-stage renal disease, on hemodialysis.  The patient was     continued on regular hemodialysis per scheduled by the Renal team.     As a matter of fact underwent dialysis in a.m. of August 23, 2011,     and tolerated the procedure well.  4. Hypertension.  The patient's BP has remained normotensive during     the course of his hospitalization.  5. History of normal-pressure hydrocephalus.  There were no problems     referrable to this.  Multiple imaging studies showed that the     patient's VP shunt was in appropriate position and appeared to have     no complications.  6. Obstructive sleep apnea syndrome.  There were no problems referrable     to this.  7. Insulin-requiring type 2 diabetes mellitus.  The patient was     managed with scheduled Lantus, sliding-scale insulin coverage and     appropriate diet, and remained euglycemic.  8. History of anemia of chronic disease.  The patient's hemoglobin has     remained stable during the course of his hospitalization, and was     9.8 with a hematocrit of 30.0 on August 22, 2011.  9. History of depression.  The patient's mood has remained stable. 10.Delirium.  The patient did have episodes of delirium in the initial     part of his hospitalization, associated with acute medical problems,     however, as of August 22, 2011, this had clearly resolved.  DISPOSITION:  The patient was seen by the palliative care team during the course of this hospitalization.  Goals of care were discussed with the family.  Family have elected to continue hemodialysis and were hopeful that the patient would be able to  recover sufficiently for possible transfer to SNF.  As it turned out, the patient did indeed improve appreciably.  As of August 23, 2011, he was felt clinically stable for discharge to skilled nursing facility.  He has been discharged accordingly.  ACTIVITY:  As tolerated otherwise per PT/OT.  DIET:  Renal 60-2-2, dysphagia II/nectar-thick liquids.  FOLLOWUP INSTRUCTIONS:  The patient will follow up with SNF MD.  In addition, he will also follow up with his primary MD, Dr. Rene Paci; primary cardiologist, Dr. Lewayne Bunting; primary nephrologist, Dr. Zetta Bills and primary neurosurgeon, Dr. Jacqulyn Ducking, M.D.     CO/MEDQ  D:  08/23/2011  T:  08/23/2011  Job:  [id number]  cc:   Vikki Ports A. Felicity Coyer, MD Doylene Canning. Ladona Ridgel, MD Zetta Bills, MD Donalee Citrin, M.D.  Electronically Signed by Isidor Holts M.D. on 08/26/2011 07:05:28 PM

## 2011-08-29 LAB — GLUCOSE, CAPILLARY: Glucose-Capillary: 153 mg/dL — ABNORMAL HIGH (ref 70–99)

## 2011-08-30 NOTE — Consult Note (Signed)
[name], [name] NO.:  [id number]  MEDICAL RECORD NO.:  [id number]  LOCATION:  3015                         FACILITY:  [facility]  PHYSICIAN:  Joycelyn Schmid, MD   DATE OF BIRTH:  25-Aug-1933  DATE OF CONSULTATION: DATE OF DISCHARGE:                                CONSULTATION   CHIEF COMPLAINT:  Left-sided weakness.  HISTORY OF PRESENT ILLNESS:  A 77 year old male with complex medical history of hypertension, diabetes, congestive heart failure, end-stage renal disease, normal pressure hydrocephalus status post VP shunt, hypercholesterolemia, sleep apnea, B12 deficiency, who has had a 68-month gradual progressive decline in terms of increased confusion, decreased functional status, and ability.  He woke up this morning, confused with inability to put on his shirt.  He was noted to have difficulty standing up.  He had left face arm and leg weakness.  He went to his primary care physician's office for routine appointment and was found to have blood glucose of 54.  He was then sent to the emergency room for further evaluation.  Throughout the day of the emergency room evaluation, he has had progressive vomiting and confusion.  Continues to have left-sided weakness.  TPA was not given as the patient arrived out of the window for intervention, and also with other complex medical history.  PAST MEDICAL HISTORY:  As per the HPI.  MEDICATIONS:  Plavix, Effient, Lipitor, Coreg.  See chart for further details.  ALLERGIES:  PROCARDIA and TAPE.  FAMILY HISTORY:  Stroke, coronary artery disease, lung cancer.  SOCIAL HISTORY:  He lives with his wife.  No tobacco, alcohol, or illicit drug use.  REVIEW OF SYSTEMS:  As per the HPI.  He is not able to provide information during my evaluation due to confusion, sleepiness.  PHYSICAL EXAMINATION:  VITAL SIGNS:  Blood pressure 210/85 initially, now 168/92, temperature 98.7, heart rate 108, respirations 20, O2 sat 95%  room air. GENERAL:  The patient has eyes closed.  He is extremely sleepy, he just received Ativan and Phenergan.  He is barely able to open his eyes to verbal command.  He follow some simple commands with his right upper extremity.  No verbal. HEENT:  Pupils reactive from 2 to 1 mm, right gaze preference, possible decreased left field, blink to threat on the left side.  He has left lower facial weakness.  Tongue appears midline. MUSCULOSKELETAL:  Motor examination, right upper extremity spontaneous movements, 2/5 strength.  Open and closes grip to command.  Left upper extremity 0/5, right lower extremity 2/5, withdraws to stim.  Left lower extremity withdraws to stim.  Reflexes 1+ in the upper extremities, 0 at the knees and ankles.  Downgoing toes. CARDIOVASCULAR:  Tachycardic.  No murmurs, no carotid bruits.  LAB TESTING:  Sodium 139.  BUN 16, creatinine 4.0, glucose 202.  White count 6.1, platelets 199.  CT scan of the head which I reviewed shows moderate ventriculomegaly with right parietal shunt.  ASSESSMENT AND PLAN:  This is a 77 year old male with complex medical history, now with left face, arm, and leg weakness, right gaze deviation, decreased verbal output, possible right middle cerebral artery ischemic infarction versus right subcortical  ischemic infarction with thalamic involvement.  RECOMMENDATIONS: 1. The patient is DNR/DNI status with complex history.  He does not     appear to be a candidate for any significant intervention or     changes to current medical therapy, therefore I do not think that     aggressive workup would be warranted in this case.  We agree with     continuing Plavix, Effient, Lipitor, and Coreg as currently.  If     the patient is a candidate for Coumadin therapy, could consider     transthoracic echocardiogram to see if the patient has cardiac     source of emboli.  He does not seem to be a candidate for carotid     endarterectomy or stenting  and therefore I would not check carotid     ultrasound.  May consider repeating CT scan in 24-48 hours to     define the size and the extent of stroke.  He is not able to get     MRI scan.  I discussed my findings with the patient's son, Marx Doig at bedside.     Joycelyn Schmid, MD     VP/MEDQ  D:  08/08/2011  T:  08/08/2011  Job:  [id number]  Electronically Signed by Joycelyn Schmid  on 08/30/2011 06:57:25 PM

## 2011-09-07 NOTE — Consult Note (Signed)
[name], [name] NO.:  [id number]  MEDICAL RECORD NO.:  [id number]  LOCATION:  3017                         FACILITY:  [facility]  PHYSICIAN:  Edsel Petrin, D.O.DATE OF BIRTH:  [birth year]  DATE OF CONSULTATION:  08/15/2011 DATE OF DISCHARGE:                                CONSULTATION   Consult is for review of medical treatment options, clarification of goals of care, and end-of-life wishes and symptom recommendations.  Consult is requested by Dr. Eda Paschal.  Collaborating physician to appear on consult is Dr. Derenda Mis.  This nurse practitioner, [name], reviewed medical records, received report from team, assessed the patient and then met at the patient's bedside with his wife [name], phone number [phone], his son [name], phone number [phone], his daughter [name], phone number [phone] to discuss diagnosis, prognosis, goals of care, end-of-life wishes, disposition, and options.  PROBLEM LIST: 1. Altered mental status.     a.     Right brain cerebrovascular accident/left hemiparesis.     b.     Dysphagia.     c.     History of hydrocephalus. which is stable. 2. End-stage renal disease, dialysis dependent 3 days a week. 3. Coronary artery disease, status post coronary artery bypass     grafting. 4. Ischemic cardiomyopathy.     a.     Congestive heart failure. 5. Diabetes mellitus. 6. Gastroesophageal reflux disease. 7. Palliative performance scale 20% at best.  PATIENT/FAMILY GOALS/RECOMMENDATIONS: 1. DNR/DNI. 2. Continue present medical treatment plan for the next 18 hours and     then re-meet with this nurse practitioner at 10:30 tomorrow     morning. 3. Keep PEG once schedule for tomorrow "just in case."  I believe the     family is leaning towards a full comfort care, but they wish to     have the option of PEG placed tomorrow if the decision goes that     way.  A detailed discussion was had today  regarding advance directives. Concepts specific to code status, artificial feeding and hydration, continued use of IV antibiotics, and rehospitalization was had.  The difference between aggressive medical intervention path and a palliative comfort care path for this patient at this time in the situation was detailed.  The natural trajectory and expectations at end-of-life were discussed.  Questions and concerns were addressed.  The natural trajectory at end-of-life in a patient if stops dialysis was discussed in detail.  Questions and concerns were addressed.  A Hard Choices booklet was left for review.  The family is encouraged to call with questions or concerns.  The risks and benefits of artificial feeding via a PEG tube were discussed in detail.  Questions and concerns were addressed.  Total time spent on the unit was 115 minutes.  Time in 1 o'clock and time out 2:55.  Greater than 50% of the time was spent in counseling and coordination of care.  Diagnosis and prognosis was clarified.  The concept of hospice and palliative care was reviewed.  Team approach to our service was offered.  Values and goals of care important to the patient and family were  attempted to be elicited.  Dr. Eda Paschal is aware of the above consult.  This nurse practitioner will meet with the family tomorrow morning at 10:30 and continue to support holistically.  Any questions or concerns regarding this consult, please call [name] at [phone].     Dakota Pun, NP   ______________________________ Edsel Petrin, D.O.    MCL/MEDQ  D:  08/15/2011  T:  08/15/2011  Job:  [id number]  Electronically Signed by Dakota Creed NP on 09/04/2011 03:35:19 PM Electronically Signed by Anderson Malta D.O. on 09/07/2011 03:55:32 PM

## 2011-09-08 ENCOUNTER — Encounter: Payer: Medicare Other | Admitting: Internal Medicine

## 2011-09-13 ENCOUNTER — Telehealth: Payer: Self-pay | Admitting: *Deleted

## 2011-09-13 DIAGNOSIS — E109 Type 1 diabetes mellitus without complications: Secondary | ICD-10-CM

## 2011-09-13 NOTE — Telephone Encounter (Signed)
PT at [facility] informed. Pt has upcoming appointment for 09/14/2011 at 1:00pm.

## 2011-09-13 NOTE — Telephone Encounter (Signed)
[facility] PT on behalf of pt's family requesting an RN to help with diabetic issues and to help pt check sugars-pt is having difficulty checking sugars since stroke-please advise

## 2011-09-13 NOTE — Telephone Encounter (Signed)
i ordered Ov with me is due.  Please make appt

## 2011-09-14 ENCOUNTER — Ambulatory Visit (INDEPENDENT_AMBULATORY_CARE_PROVIDER_SITE_OTHER): Payer: Medicare Other | Admitting: Endocrinology

## 2011-09-14 ENCOUNTER — Encounter: Payer: Medicare Other | Admitting: Internal Medicine

## 2011-09-14 ENCOUNTER — Encounter: Payer: Self-pay | Admitting: Endocrinology

## 2011-09-14 VITALS — BP 124/60 | HR 110 | Temp 97.8°F | Ht 73.0 in | Wt 187.4 lb

## 2011-09-14 DIAGNOSIS — I6789 Other cerebrovascular disease: Secondary | ICD-10-CM

## 2011-09-14 NOTE — Patient Instructions (Addendum)
pending the test results, please reduce humalog to (just before each meal) 15-10-30-15 units (the 15 units is if you eat a bedtime snack).  take extra novolog 5 units as needed for any sugar over 300.  Please schedule a follow-up appointment in 1 month. reduce nph to 65 units units at bedtime only. check your blood sugar 4 times a day--before the 3 meals, and at bedtime.  also check if you have symptoms of your blood sugar being too high or too low.  please keep a record of the readings and bring it to your next appointment here.  please call us sooner if you are having low blood sugar episode. i am requesting a follow-up neurologist appointment for you.  you will receive a phone call, about a day and time for an appointment.

## 2011-09-14 NOTE — Progress Notes (Signed)
Subjective:    Patient ID: [name], male    DOB: 06/12/1933, 77 y.o.   MRN: [id number]  HPI Pt was recently hospitalized for cva.  He has lost 10-15 lbs. He now takes humalog tid (qac) 25-20-45, and nph, 10 units qam and 65 units in the evening.  he brings a record of his cbg's which i have reviewed today.  It varies from 60-210.  It i in generl highest in am, and lower at all other times of day.   Past Medical History  Diagnosis Date  . SLEEP APNEA, OBSTRUCTIVE     CPAP qhs  . VITAMIN B12 DEFICIENCY   . PARKINSON'S DISEASE   . CORONARY ARTERY DISEASE     s/p  CABG 2003-cath 12/31/09-occluded sap dx  graft, otherwise patent grafts  . Chronic systolic heart failure     LVEF 35-40% by cath 12/31/09 s/p BiV ICD 07/2010  . Gastroparesis   . CORONARY ARTERY BYPASS GRAFT, THREE VESSEL, HX OF 2003  . AUTOMATIC IMPLANTABLE CARDIAC DEFIBRILLATOR SITU 07/2010 BiV ICD  . ANEMIA, MACROCYTIC   . DEPRESSION   . DIABETES MELLITUS, TYPE I   . GERD   . HYPERCHOLESTEROLEMIA   . HYPERTENSION   . RENAL FAILURE, END STAGE     HD MWD a/w anemia of chronic dz, hypotension  . PERIPHERAL NEUROPATHY   . History of colonoscopy   . Renal insufficiency   . Cancer   . Collagen vascular disease     Past Surgical History  Procedure Date  . Coronary artery bypass graft   . Cataract extraction, bilateral   . Pacemaker placement     permanent  . Dialysis fistula creation   . Shunt replacement     History   Social History  . Marital Status: Married    Spouse Name: N/A    Number of Children: 2  . Years of Education: N/A   Occupational History  . retired    Social History Main Topics  . Smoking status: Never Smoker   . Smokeless tobacco: Never Used   Comment: Married, lives with spouse, supportive dtr and son nearby. Pt is on dialysis  . Alcohol Use: No  . Drug Use: No  . Sexually Active: Not on file   Other Topics Concern  . Not on file   Social History Narrative   Pt does not get  regular exercise.Pt is on dialysis-MWF_since 10/2010Lives with spouse, supportive dtr and son nearby    Current Outpatient Prescriptions on File Prior to Visit  Medication Sig Dispense Refill  . acetaminophen (TYLENOL) 325 MG tablet as needed.        Marland Kitchen aspirin 81 MG tablet Take 81 mg by mouth daily.        Marland Kitchen atorvastatin (LIPITOR) 20 MG tablet Take 20 mg by mouth daily.        . betamethasone dipropionate 0.05 % lotion Apply topically 2 (two) times daily as needed.        . calcium acetate (PHOSLO) 667 MG capsule 3 (three) times daily. 1 capsule at dinner      . camphor-menthol (SARNA) lotion Apply topically. As directed       . citalopram (CELEXA) 20 MG tablet Take 20 mg by mouth daily.        Marland Kitchen docusate sodium (COLACE) 100 MG capsule as needed.       Marland Kitchen esomeprazole (NEXIUM) 40 MG capsule Take 40 mg by mouth 2 (two) times daily.        Marland Kitchen  glucagon (GLUCAGON EMERGENCY) 1 MG injection Use as directed       . hydrOXYzine (ATARAX) 25 MG tablet TAKE AS DIRECTED THE MORNING OF DIALYSIS  30 tablet  1  . insulin lispro (HUMALOG) 100 UNIT/ML injection Inject four times daily: 15-10-30-15 units      . insulin NPH (HUMULIN N PEN) 100 UNIT/ML injection Inject 65 units every bedtime      . isosorbide mononitrate (IMDUR) 60 MG 24 hr tablet Take 60 mg by mouth daily.        . metoprolol (TOPROL-XL) 50 MG 24 hr tablet 1 tablet daily       . multivitamin (RENA-VIT) TABS tablet Take 1 tablet by mouth daily.        . nitroGLYCERIN (NITRODUR - DOSED IN MG/24 HR) 0.4 mg/hr Use as needed       . NON FORMULARY CPAP  For obstructive sleep apnea       . NON FORMULARY Dialysis  On Mon Wed Fri       . Nutritional Supplements (NEPRO) LIQD Take by mouth daily.        . ondansetron (ZOFRAN) 4 MG tablet As directed       . prasugrel (EFFIENT) 10 MG TABS        . promethazine (PHENERGAN) 12.5 MG tablet as needed.        . ranolazine (RANEXA) 500 MG 12 hr tablet Take 500 mg by mouth daily.        . traMADol (ULTRAM)  50 MG tablet Take 1 by mouth as needed for back pain      . zolpidem (AMBIEN) 5 MG tablet Take 5 mg by mouth at bedtime as needed.        . Insulin Pen Needle (B-D ULTRAFINE III SHORT PEN) 31G X 8 MM MISC Use as directed        Current Facility-Administered Medications on File Prior to Visit  Medication Dose Route Frequency Provider Last Rate Last Dose  . 0.9 %  sodium chloride infusion  500 mL Intravenous Continuous Sheryn Bison, MD        Allergies  Allergen Reactions  . Nifedipine     Family History  Problem Relation Age of Onset  . Stroke Mother   . Diabetes Father   . Lung cancer Father   . Diabetes Brother   . Stomach cancer Paternal Aunt   . Colon cancer Neg Hx    BP 124/60  Pulse 110  Temp(Src) 97.8 F (36.6 C) (Oral)  Ht 6\' 1"  (1.854 m)  Wt 187 lb 6.4 oz (85.004 kg)  BMI 24.72 kg/m2  SpO2 97%  Review of Systems Denies loc     Objective:   Physical Exam VITAL SIGNS:  See vs page GENERAL: no distress Gait: slow but steady, with a cane.        Assessment & Plan:  Dm, overcontrolled due to the weight he recently lost due to cva.

## 2011-09-15 ENCOUNTER — Encounter: Payer: Self-pay | Admitting: Neurology

## 2011-09-17 NOTE — Consult Note (Signed)
[name], [name] NO.:  [id number]  MEDICAL RECORD NO.:  [id number]  LOCATION:                                 FACILITY:  PHYSICIAN:  Acey Lav, MD  DATE OF BIRTH:  07/12/1950  DATE OF CONSULTATION: DATE OF DISCHARGE:                                CONSULTATION   REQUESTING PHYSICIAN:  Hind I Elsaid, MD.  REASON FOR INFECTIOUS DISEASE CONSULTATION:  Patient with fevers and left-sided hemiparesis.  DISCUSSION:  For details, please see the handwritten note and the paper chart as written by my first year internal medicine resident.  I have reviewed the pertinent electronic and paper medical record including history of present illness, past medical history, past surgical history, family history, social history, allergies, medications, and review of systems that were obtainable from family.  I have examined the patient and agreed with assessment and plan as outlined in his note with the following addendum.Briefly, this is a 77 year old Caucasian gentleman with complicated past medical history including history of end-stage renal disease on hemodialysis, ICD placement, normal pressure hydrocephalus status post VP shunt placement, diabetes, coronary artery disease with coronary artery bypass grafting who apparently has had approximately 6 weeks of intermittent confusion noted by his wife.  He has had the movements where he has not been himself and not been able to remember to do various tasks such as injecting insulin.  His wife had noted this in the patient and followed by the primary care physician, Dr. Rene Paci.  There was concern that the patient might having some seizure- like activity.  Ultimately, the patient developed worsening slurred speech and left-sided weakness and was admitted to the [facility] on August 14.  The patient was noticed to have fevers after arriving here in the hospital and has been febrile up to 101.6 which  was yesterday.  On admission, he had a chest x-ray which was suggestive of possible edema versus aspiration.  The patient was placed on vancomycin and Zosyn yesterday.  He has been persistently febrile, however, despite introduction of these antibiotics.  Workup for him so far has included CT of the head which has been negative.  A CT angiogram of the head which shows a bulky calcified plaque at the right ICA origin with stenosis of 65%.  He also had some left carotid atherosclerosis without hemodynamically significant stenosis.  There was some patchy air space opacities in the right posterior lung as well seen on CT angiogram.  The head itself showed no intracranial abnormalities other than the ICA siphon atherosclerosis and distal vertebral artery atherosclerosis.  He also had ventriculomegaly which was stable compared to prior exams.  The CSF shunt ultimately did not appear to be having abnormalities associated with it.  He has had repeat imaging including abdominal films show enteric tube placed in position.  He has been seen by Neurology who had recommended Plavix, Effient, Lipitor and, carvedilol.  The patient has had a history per the wife of being hypotensive during hemodialysis, but actually has been here hypertensive for most of his stay on the wards.  There was concern about also possibility of infectious endocarditis with  septic emboli and Cardiology had been asked to perform a transesophageal echocardiogram to evaluate for this.  The patient's blood cultures themselves have been negative from the 16th and also negative from the 15th to date.  He did have pyuria as well on urine examination that was done yesterday.  Urine culture is not back yet.  We were asked to assist in the management workup of this patient with fever, left-sided hemiparesis, and complicated medical history described above.  1. Fever:  Certainly, differential is broad.  He could have fever in     the  setting of stroke itself.  He could have fever due to a urinary     tract infection.  The patient had apparently recent renal stones     seen in the ER and his urine does show pyuria.  So, certainly it     could be possible that he had urinary tract infection.  Pneumonia     is possible.  The chest x-ray is not very compelling and the     history is not very suggestive for this.  Any case, he is broadly     covered currently with vancomycin and cefepime for healthcare-     associated pneumonia.  Certainly, neurosyphilis could cause fevers     and vascular phenomenon in the central nervous system and this is     certainly a possibility.  I would check a serum RPR and check for     prozone effect.  Other are less likely causes of his stroke like     syndrome and fevers would include varicella zoster infection of the     central nervous system.  Typically this does occur in the setting     of recent varicella zoster outbreak.  The patient has had zoster     previously but 5 years ago, not in recent history.  I am skeptical     that he would have this.  However, it is possible.  One could     perform a lumbar puncture to send spinal fluid for cell count     differential, protein, glucose and herpes simplex by PCR and     varicella zoster by PCR.  However, I doubt that Radiology will do     at this point time given his blood thinner such as Plavix and I     believe he also received some heparin.  One could as the     interventional radiologist or the neurology team to do this.  If     this was done I would also check CSF for VDRL and CSF for     fluorescent treponemal antibody.  At this point time, however, I     would not push to do this aggressively if there is concern about     bleeding with a lumbar puncture.  The family also is bit anxious     about this.  I also would not start acyclovir at this point time,     as I am not very compelled with history due to worry excessively     about  varicella zoster virus infection.  Finally, a shunt infection     will be another possibility and this will be another reason to do     cell count differential.  Alternatively, Neurosurgery could be     consulted for his shunt for aspiration of the shunt and spinal     fluid for cell count differential, protein and glucose.  In the     interval, however, I would change his antibiotics where he has been     changed to which is vancomycin and cefepime which have good     penetration of spinal fluid and coverage for streptococcal species     Pseudomonas as well as MRSA and MSSA.  Right brain stroke, seems to be most likely to be due to his underlying risk factors of hypertension and ICA stenosis.  Certainly, he could become temporarily hypotensive in the setting of severe infection and this could have been possibly caused stroke-like symptoms.  Infectious endocarditis and septic emboli is another possibility, I would see something on the CT angiogram and this does remain a possibility and PE is not unreasonable.  Again, I have already described risks possibilities of neurosyphilis and varicella zoster virus infection.  I do not think these is terribly likely but I will check a serum RPR in this in the blood if his spinal fluid sent check for VDRL and spinal fluid, HSV and PVC by PCR.  Finally, I will also check for HIV though he does not seem to be high risk for this..  My partner Dr. Maurice March will to be available over the weekend.  I will be back on Monday.  Thank you for Infectious Disease consultation,     Acey Lav, MD     CV/MEDQ  D:  08/11/2011  T:  08/11/2011  Job:  [id number]  Electronically Signed by Paulette Blanch DAM MD on 09/17/2011 10:01:03 PM

## 2011-09-18 ENCOUNTER — Telehealth: Payer: Self-pay | Admitting: *Deleted

## 2011-09-18 NOTE — Telephone Encounter (Signed)
Wife left msg on vm husband has appt on Thursday needing to get a do not resuscitate form fill-out. Wanting to know were can they get form. Called wife back we have forms in office. MD can fill-out when he comes in...09/18/11@11 :56am/LMB

## 2011-09-19 ENCOUNTER — Ambulatory Visit: Payer: Medicare Other | Admitting: Endocrinology

## 2011-09-21 ENCOUNTER — Ambulatory Visit (INDEPENDENT_AMBULATORY_CARE_PROVIDER_SITE_OTHER): Payer: Medicare Other | Admitting: Internal Medicine

## 2011-09-21 ENCOUNTER — Encounter: Payer: Self-pay | Admitting: Internal Medicine

## 2011-09-21 VITALS — BP 130/76 | HR 118 | Temp 97.9°F | Ht 73.0 in | Wt 186.1 lb

## 2011-09-21 DIAGNOSIS — R404 Transient alteration of awareness: Secondary | ICD-10-CM

## 2011-09-21 DIAGNOSIS — I6330 Cerebral infarction due to thrombosis of unspecified cerebral artery: Secondary | ICD-10-CM

## 2011-09-21 DIAGNOSIS — G4733 Obstructive sleep apnea (adult) (pediatric): Secondary | ICD-10-CM

## 2011-09-21 DIAGNOSIS — R112 Nausea with vomiting, unspecified: Secondary | ICD-10-CM

## 2011-09-21 MED ORDER — ONDANSETRON HCL 4 MG PO TABS: 4.0000 mg | ORAL_TABLET | Freq: Three times a day (TID) | ORAL | Status: DC | PRN

## 2011-09-21 NOTE — Assessment & Plan Note (Addendum)
[facility] 07/2011 reviewed - L HP and dysphagia, improved Ongoing HHPT - meds reviewed Still episodes of fatigue and nausea -

## 2011-09-21 NOTE — Patient Instructions (Signed)
It was good to see you today. We have reviewed your prior records including interval history, labs and tests today we'll make referral to advanced for the CPAP mask. Our office will contact you regarding appointment(s) once made. Use Zofran in the AM to try to prevent nausea spells and make sure the shower is not too hot! Your prescription(s) have been submitted to your pharmacy. Please take as directed and contact our office if you believe you are having problem(s) with the medication(s). Other Medications reviewed, no changes at this time. .do not resuscitate form signed for you today Please schedule followup in 3 months, call sooner if problems.

## 2011-09-21 NOTE — Progress Notes (Signed)
Subjective:    Patient ID: [name], male    DOB: [birth year], 77 y.o.   MRN: [id number]  HPI  Here for hospital follow up -   CVA with L HP and dysphagia 07/2011 - dc summary and tests reviewed Deficits improved - working with HHPT, s/p SNF rehab for same - home with family since 09/08/11  Wife notes continued episodes of "disorientation" - unable to dress self "frozen" and "unresponsive but eyes open and awake" approx 1 episode per week, variable levels of LOC -from "slow" to "frozen" or confused (reports keeping insulin in keyboard after giving self insulin admin) - reviewed log of "events" since return home 09/08/11 as recorded by wife. Also nausea and vomiting , esp after AM shower - No incontinence, no seziure motor activity during any spells - no bowel changes, no abdominal pain  Last eval by nsurg 07/19/11 - head ct ok (no recurrent NPH change since VP shunt 08/2009), no MRI due to PPM Still walking "bent over and not picking feet up" - prior ?pk dz before tx for NPH 08/2009 - OP neuro f/u pending  Family/pt request completion of DNR form for home and HD  Past Medical History  Diagnosis Date  . SLEEP APNEA, OBSTRUCTIVE     CPAP qhs  . VITAMIN B12 DEFICIENCY   . PARKINSON'S DISEASE   . CORONARY ARTERY DISEASE     s/p  CABG 2003-cath 12/31/09-occluded sap dx  graft, otherwise patent grafts  . Chronic systolic heart failure     LVEF 35-40% by cath 12/31/09 s/p BiV ICD 07/2010  . Gastroparesis   . CORONARY ARTERY BYPASS GRAFT, THREE VESSEL, HX OF 2003  . AUTOMATIC IMPLANTABLE CARDIAC DEFIBRILLATOR SITU 07/2010 BiV ICD  . ANEMIA, MACROCYTIC   . DEPRESSION   . DIABETES MELLITUS, TYPE I   . GERD   . HYPERCHOLESTEROLEMIA   . HYPERTENSION   . RENAL FAILURE, END STAGE     HD MWD a/w anemia of chronic dz, hypotension  . PERIPHERAL NEUROPATHY   . History of colonoscopy   . Renal insufficiency   . Cancer   . Collagen vascular disease   . CVA (cerebral vascular accident) 07/2011    L  HP and dysphagia, improved   Review of Systems  Constitutional: Negative for fever.  Respiratory: Negative for shortness of breath.   Cardiovascular: Negative for chest pain.  Gastrointestinal: Negative for abdominal pain, diarrhea, constipation and blood in stool.  Genitourinary: Negative for flank pain.       Objective:   Physical Exam  BP 130/76  Pulse 118  Temp(Src) 97.9 F (36.6 C) (Oral)  Ht 6\' 1"  (1.854 m)  Wt 186 lb 1.9 oz (84.423 kg)  BMI 24.56 kg/m2  SpO2 97% Constitutional:  oriented to person, place, and time. appears well-developed and well-nourished. No distress. Wife/dtr at side Neck: Normal range of motion. Neck supple. No JVD present. No thyromegaly present.  Cardiovascular: Normal rate, regular rhythm and normal heart sounds.  No murmur heard. No BLE edema Pulmonary/Chest: Effort normal and breath sounds normal. No respiratory distress. no wheezes.  Neuro: AAOx3, CN2-12 symmetrically intact; MAE well - gait slow with use of cane, no detectable L HP or dysarthria    Wt Readings from Last 3 Encounters:  09/21/11 186 lb 1.9 oz (84.423 kg)  09/14/11 187 lb 6.4 oz (85.004 kg)  08/08/11 196 lb 12.8 oz (89.268 kg)   Lab Results  Component Value Date   WBC 6.3 08/22/2011  HGB 9.8* 08/22/2011   HCT 30.0* 08/22/2011   PLT 141* 08/22/2011   CHOL 131 08/09/2011   TRIG 193* 08/09/2011   HDL 30* 08/09/2011   LDLDIRECT 68.2 07/26/2010   ALT 11 08/10/2011   AST 18 08/10/2011   NA 136 08/21/2011   K 4.0 08/21/2011   CL 97 08/21/2011   CREATININE 6.32* 08/21/2011   BUN 87* 08/21/2011   CO2 21 08/21/2011   TSH 1.296 08/15/2010   INR 1.14 08/09/2011   HGBA1C 7.0* 08/09/2011     Assessment & Plan:  CVA 07/2011 - L HP and dysphagia improved - cont med mgmt, HHPT and OP neuro f/u  AMS, ?seizure events - event 07/2011 clinic associated with hypoglycemia (50s) but other events since without hypoglycemia per spouse. unable to have MRI due to PPM - f/u OP neuro, no driving until further  eval complete and no "spells" x 6 mo - Wife will continue to keep log of events, to include time/day and cbg Also note prior ?dx of Pk pre 2009 - then changed dx to NPH and underwent VP shunt 08/2009 for same with seeming temporary improvement  nausea and vomiting - AM after shower - ?vasovagal without syncope - GI exam normal and HD stable - recommended taking zofran each AM upon rising x 1 week (before shower) - new erx refill done -  Time spent with pt/family today 30 minutes, greater than 50% time spent counseling patient on hosp for CVA 07/2011, ?seizures, and medication review. Also completion of out of facility DNR forms after discussion of same with pt/family

## 2011-09-22 ENCOUNTER — Other Ambulatory Visit: Payer: Self-pay | Admitting: Internal Medicine

## 2011-09-22 ENCOUNTER — Telehealth: Payer: Self-pay | Admitting: *Deleted

## 2011-09-22 NOTE — Telephone Encounter (Signed)
Contacted insurance spoke with [employer] [phone]. Patient ID [id number]. Fax over form md completed and fax back. Received confirmation letter med was approve. Notified [facility] spoke with Clydie Braun gave info.....09/22/11@4 :32pm/LMB

## 2011-09-26 ENCOUNTER — Ambulatory Visit (INDEPENDENT_AMBULATORY_CARE_PROVIDER_SITE_OTHER): Payer: Medicare Other | Admitting: Neurology

## 2011-09-26 ENCOUNTER — Ambulatory Visit: Payer: Medicare Other

## 2011-09-26 ENCOUNTER — Encounter: Payer: Self-pay | Admitting: Neurology

## 2011-09-26 ENCOUNTER — Other Ambulatory Visit: Payer: Self-pay | Admitting: Internal Medicine

## 2011-09-26 DIAGNOSIS — I6350 Cerebral infarction due to unspecified occlusion or stenosis of unspecified cerebral artery: Secondary | ICD-10-CM

## 2011-09-26 DIAGNOSIS — R413 Other amnesia: Secondary | ICD-10-CM

## 2011-09-26 DIAGNOSIS — I639 Cerebral infarction, unspecified: Secondary | ICD-10-CM

## 2011-09-26 LAB — AMMONIA: Ammonia: 23 umol/L (ref 11–35)

## 2011-09-26 LAB — HEPATIC FUNCTION PANEL
Albumin: 4 g/dL (ref 3.5–5.2)
Total Bilirubin: 0.8 mg/dL (ref 0.3–1.2)

## 2011-09-26 MED ORDER — METOPROLOL SUCCINATE ER 50 MG PO TB24: 50.0000 mg | ORAL_TABLET | Freq: Every day | ORAL | Status: DC

## 2011-09-26 NOTE — Patient Instructions (Addendum)
Go to the basement to have your lab work drawn today.  Your EEG has been scheduled for Thur. October 11th at 2:30pm.  Please arrive by 2:15pm to [facility].  First floor admitting.

## 2011-09-26 NOTE — Progress Notes (Signed)
Dear Dr. Felicity Coyer,  Thank you for having me see [name] in consultation today at [facility] for his problem with transient spells of confusion.  As you may recall, he is a 77 y.o. year old male with a history of renal failure, diabetes, normal pressure hydrocephalus s/p VP shunt who was seen by you August 14th for repeated spells of confusion.  These spells could last anywhere from 20 minutes to a day and a half, and were characterized by confusion, typically with doing things like checking his sugar.  He did not have adventitious movements or bladder control problems during these spells.  These had been going on since July and occurred every 2-3 weeks although had been getting more frequent.  His wife had checked his glucose frequently during these spells as well as his blood pressure and it was noted to be unremarkable.  On the day you saw him he had confusion in the a.m.  This seemed to worsen throughout the day when the patient was taken by his family to [facility].  He developed a fever that night and they were told he had a stroke given his left sided weakness.  However, multiple CT heads were done that did not show a new lesion, but an MRI could not be done given his history of having a pacemaker.  He slowly made a recovery, going from an unresponsive state to  becoming responsive with improvement of his left sided weakness.  He spent two weeks in rehab and during this time was placed on Keppra.  An EEG was done that revealed right sided delta range slowing.  Since hospitalization he has had only 1 other spell of confusion.  Past Medical History  Diagnosis Date  . SLEEP APNEA, OBSTRUCTIVE     CPAP qhs  . VITAMIN B12 DEFICIENCY   . PARKINSON'S DISEASE   . CORONARY ARTERY DISEASE     s/p  CABG 2003-cath 12/31/09-occluded sap dx  graft, otherwise patent grafts  . Chronic systolic heart failure     LVEF 35-40% by cath 12/31/09 s/p BiV ICD 07/2010  . Gastroparesis   . CORONARY ARTERY BYPASS  GRAFT, THREE VESSEL, HX OF 2003  . AUTOMATIC IMPLANTABLE CARDIAC DEFIBRILLATOR SITU 07/2010 BiV ICD  . ANEMIA, MACROCYTIC   . DEPRESSION   . DIABETES MELLITUS, TYPE I   . GERD   . HYPERCHOLESTEROLEMIA   . HYPERTENSION   . RENAL FAILURE, END STAGE     HD MWD a/w anemia of chronic dz, hypotension  . PERIPHERAL NEUROPATHY   . History of colonoscopy   . Renal insufficiency   . Cancer   . Collagen vascular disease   . CVA (cerebral vascular accident) 07/2011    L HP and dysphagia, improved    Past Surgical History  Procedure Date  . Coronary artery bypass graft   . Cataract extraction, bilateral   . Pacemaker placement     permanent  . Dialysis fistula creation   . Shunt replacement     History   Social History  . Marital Status: Married    Spouse Name: N/A    Number of Children: 2  . Years of Education: N/A   Occupational History  . retired    Social History Main Topics  . Smoking status: Never Smoker   . Smokeless tobacco: Never Used   Comment: Married, lives with spouse, supportive dtr and son nearby. Pt is on dialysis  . Alcohol Use: No  . Drug Use: No  .  Sexually Active: None   Other Topics Concern  . None   Social History Narrative   Pt does not get regular exercise.Pt is on dialysis-MWF_since 10/2010Lives with spouse, supportive dtr and son nearby      Family History:  Positive for stroke in his mother.   ROS:  13 systems were reviewed and are notable for baseline memory problems, and gait instability, with no urinary incontinence.  All other review of systems are unremarkable.   Examination:  Filed Vitals:   09/26/11 1325  BP: 130/72  Pulse: 96  Weight: 189 lb (85.73 kg)     In general, thin appearing man who appears to have psychomotor slowing.  Cardiovascular: The patient has a regular rate and rhythm and no carotid bruits.  Fundoscopy:  Disks are flat. Vessel caliber within normal limits.  Mental status:   The patient is oriented to  person, but not floor or year. Recent memory is impaired by remote memory intact. Attention span and concentration appear decreased. Language including repetition, naming, following commands are intact. Fund of knowledge of current and historical events, as well as vocabulary are normal.  Cranial Nerves: Pupils are equally round and reactive to light. Visual fields full to confrontation. Extraocular movements are intact without nystagmus. Facial sensation and muscles of mastication are intact. Muscles of facial expression are symmetric. Hearing decreased to bilateral finger rub. Tongue protrusion, uvula, palate midline.  Shoulder shrug decreased on left.  Decreased blink frequency  Motor:  The patient has normal bulk and tone, no pronator drift. 4+/5 in shoulder abductor on left. Decreased shoulder elevation on the left.  4+/5 knee flexion on the left.   Decreased   There are no adventitious movements.  Reflexes:  Absent reflexes throughout.  Toes down.  Coordination:  Normal finger to nose.  No dysdiadokinesia.  Sensation is intact to touch bilaterally.  Gait and Station are slow and slightly magenetic.  Romberg is negative   CT Head and CTA head and neck were reviewed and were unremarkable except for stable hydrocephalus and the known right occipital shunt placement.  Impression: Transient spells of confusion with a history of NPH as well as a more prolonged spell of unresponsiveness with left sided weakness.  The patient has left sided weakness on exam and I wonder how much of that is chronic after the shunt placement.  I suspect that the patient did not have a stroke, but rather these short spells of unresponsiveness were seizures and he may have had a prolonged "subclinical" seizure that result in hospitalization.  The is the additional possibility of of anamnestic effect from an infection that could have caused his prolonged unresponsiveness and exacerbation of left sided  weakness.  Recommendations: I am going to get an a repeat routine EEG.  In addition, I have seen hepatic encephalopathy present with transient spells of unresponsiveness, so while unlikely, I will get a hepatic panel, coags and ammonia.  I agree with continuing the Keppra.  If he has more spells I would increase the Keppra to 1000 bid.  I will see him back in 6 weeks to see how he is doing and determine whether an increase in Keppra is needed or whether other diagnostic considerations need to be made.   Thank you for having [place] see [name] in consultation.  Feel free to contact me with any questions.  Lupita Raider Modesto Charon, MD [facility],  [address] [place], [place] [address] Phone: [phone] Fax: [phone].

## 2011-09-27 LAB — APTT: aPTT: 32 seconds (ref 24–37)

## 2011-10-03 LAB — URINALYSIS, ROUTINE W REFLEX MICROSCOPIC
Bilirubin Urine: NEGATIVE
Leukocytes, UA: NEGATIVE
Nitrite: NEGATIVE
Specific Gravity, Urine: 1.02
Urobilinogen, UA: 0.2
pH: 5.5

## 2011-10-03 LAB — EXPECTORATED SPUTUM ASSESSMENT W GRAM STAIN, RFLX TO RESP C

## 2011-10-03 LAB — BASIC METABOLIC PANEL
BUN: 79 — ABNORMAL HIGH
BUN: 85 — ABNORMAL HIGH
CO2: 32
CO2: 34 — ABNORMAL HIGH
CO2: 26
CO2: 24
CO2: 25
CO2: 28
Calcium: 9.1
Calcium: 9.3
Calcium: 9.5
Calcium: 8.7
Calcium: 8.5
Chloride: 91 — ABNORMAL LOW
Chloride: 91 — ABNORMAL LOW
Chloride: 102
Chloride: 105
Creatinine, Ser: 2.6 — ABNORMAL HIGH
Creatinine, Ser: 2.87 — ABNORMAL HIGH
GFR calc Af Amer: 26 — ABNORMAL LOW
GFR calc Af Amer: 30 — ABNORMAL LOW
GFR calc Af Amer: 32 — ABNORMAL LOW
GFR calc Af Amer: 26 — ABNORMAL LOW
GFR calc Af Amer: 24 — ABNORMAL LOW
GFR calc Af Amer: 28 — ABNORMAL LOW
GFR calc non Af Amer: 27 — ABNORMAL LOW
GFR calc non Af Amer: 21 — ABNORMAL LOW
Glucose, Bld: 238 — ABNORMAL HIGH
Glucose, Bld: 242 — ABNORMAL HIGH
Glucose, Bld: 197 — ABNORMAL HIGH
Potassium: 3.8
Potassium: 4.2
Potassium: 4.3
Potassium: 4.6
Potassium: 4.8
Sodium: 137
Sodium: 134 — ABNORMAL LOW
Sodium: 136
Sodium: 137

## 2011-10-03 LAB — COMPREHENSIVE METABOLIC PANEL
Alkaline Phosphatase: 22 — ABNORMAL LOW
BUN: 54 — ABNORMAL HIGH
BUN: 69 — ABNORMAL HIGH
CO2: 25
Calcium: 9.1
Creatinine, Ser: 2.6 — ABNORMAL HIGH
Creatinine, Ser: 2.65 — ABNORMAL HIGH
GFR calc non Af Amer: 24 — ABNORMAL LOW
Glucose, Bld: 237 — ABNORMAL HIGH
Glucose, Bld: 280 — ABNORMAL HIGH
Potassium: 3.9
Sodium: 137
Total Protein: 7
Total Protein: 7.2

## 2011-10-03 LAB — URINE CULTURE
Colony Count: NO GROWTH
Culture: NO GROWTH

## 2011-10-03 LAB — CBC
HCT: 31.8 — ABNORMAL LOW
HCT: 25.8 — ABNORMAL LOW
HCT: 28.8 — ABNORMAL LOW
HCT: 33.4 — ABNORMAL LOW
HCT: 26.9 — ABNORMAL LOW
HCT: 27.5 — ABNORMAL LOW
Hemoglobin: 10.7 — ABNORMAL LOW
Hemoglobin: 11.3 — ABNORMAL LOW
Hemoglobin: 8.8 — ABNORMAL LOW
Hemoglobin: 9.8 — ABNORMAL LOW
Hemoglobin: 9.3 — ABNORMAL LOW
Hemoglobin: 9.6 — ABNORMAL LOW
MCHC: 34.1
MCHC: 34.5
MCHC: 34.9
MCV: 90.3
MCV: 91.3
Platelets: 165
RBC: 3.53 — ABNORMAL LOW
RBC: 2.83 — ABNORMAL LOW
RBC: 3.03 — ABNORMAL LOW
RDW: 15.3
RDW: 15
RDW: 16.1 — ABNORMAL HIGH
RDW: 15.7 — ABNORMAL HIGH
WBC: 5.1

## 2011-10-03 LAB — POCT I-STAT 3, VENOUS BLOOD GAS (G3P V)
Bicarbonate: 32.6 — ABNORMAL HIGH
Operator id: 115261
pH, Ven: 7.473 — ABNORMAL HIGH

## 2011-10-03 LAB — B-NATRIURETIC PEPTIDE (CONVERTED LAB)
Pro B Natriuretic peptide (BNP): 143 — ABNORMAL HIGH
Pro B Natriuretic peptide (BNP): 541 — ABNORMAL HIGH
Pro B Natriuretic peptide (BNP): 297 — ABNORMAL HIGH

## 2011-10-03 LAB — BLOOD GAS, ARTERIAL
Bicarbonate: 25.6 — ABNORMAL HIGH
Bicarbonate: 22.6
Delivery systems: POSITIVE
Drawn by: 101881
Drawn by: 232811
FIO2: 0.21
Patient temperature: 98
TCO2: 26.9
TCO2: 22.5
pCO2 arterial: 37.4
pH, Arterial: 7.409
pH, Arterial: 7.399
pO2, Arterial: 79.5 — ABNORMAL LOW
pO2, Arterial: 43.6 — ABNORMAL LOW

## 2011-10-03 LAB — CULTURE, BLOOD (ROUTINE X 2): Culture: NO GROWTH

## 2011-10-03 LAB — LIPID PANEL
Cholesterol: 88
HDL: 15 — ABNORMAL LOW
LDL Cholesterol: 54

## 2011-10-03 LAB — CREATININE, URINE, RANDOM: Creatinine, Urine: 105.1

## 2011-10-03 LAB — VITAMIN B12
Vitamin B-12: 351 (ref 211–911)
Vitamin B-12: 323 (ref 211–911)

## 2011-10-03 LAB — URINE MICROSCOPIC-ADD ON

## 2011-10-03 LAB — SEDIMENTATION RATE: Sed Rate: 87 — ABNORMAL HIGH

## 2011-10-03 LAB — TSH: TSH: 1.8

## 2011-10-03 LAB — MAGNESIUM
Magnesium: 2.1
Magnesium: 2.1

## 2011-10-03 LAB — DIFFERENTIAL
Basophils Absolute: 0
Eosinophils Absolute: 0
Eosinophils Relative: 0
Lymphocytes Relative: 5 — ABNORMAL LOW
Monocytes Absolute: 0.5

## 2011-10-03 LAB — CK TOTAL AND CKMB (NOT AT ARMC)
CK, MB: 1.2
CK, MB: 1.4
Total CK: 76

## 2011-10-03 LAB — LEGIONELLA ANTIGEN, URINE

## 2011-10-03 LAB — OSMOLALITY, URINE: Osmolality, Ur: 419

## 2011-10-03 LAB — PROTIME-INR
INR: 1.2
Prothrombin Time: 15.8 — ABNORMAL HIGH

## 2011-10-03 LAB — D-DIMER, QUANTITATIVE: D-Dimer, Quant: 0.36

## 2011-10-03 LAB — POCT CARDIAC MARKERS: Operator id: 294521

## 2011-10-03 LAB — CARBOXYHEMOGLOBIN: Total hemoglobin: 12.1 — ABNORMAL LOW

## 2011-10-04 ENCOUNTER — Ambulatory Visit (HOSPITAL_COMMUNITY): Payer: Medicare Other

## 2011-10-05 ENCOUNTER — Ambulatory Visit (HOSPITAL_COMMUNITY)
Admission: RE | Admit: 2011-10-05 | Discharge: 2011-10-05 | Disposition: A | Discharge status: Home / Self Care | Payer: Medicare Other | Source: Ambulatory Visit | Attending: Neurology | Admitting: Neurology

## 2011-10-05 DIAGNOSIS — R9401 Abnormal electroencephalogram [EEG]: Secondary | ICD-10-CM | POA: Insufficient documentation

## 2011-10-05 DIAGNOSIS — I639 Cerebral infarction, unspecified: Secondary | ICD-10-CM

## 2011-10-05 DIAGNOSIS — F29 Unspecified psychosis not due to a substance or known physiological condition: Secondary | ICD-10-CM | POA: Insufficient documentation

## 2011-10-05 DIAGNOSIS — R413 Other amnesia: Secondary | ICD-10-CM

## 2011-10-05 DIAGNOSIS — R569 Unspecified convulsions: Secondary | ICD-10-CM

## 2011-10-05 DIAGNOSIS — G912 (Idiopathic) normal pressure hydrocephalus: Secondary | ICD-10-CM | POA: Insufficient documentation

## 2011-10-05 DIAGNOSIS — Z982 Presence of cerebrospinal fluid drainage device: Secondary | ICD-10-CM | POA: Insufficient documentation

## 2011-10-05 LAB — URINE MICROSCOPIC-ADD ON

## 2011-10-05 LAB — COMPREHENSIVE METABOLIC PANEL
ALT: 16
AST: 19
Albumin: 4
Calcium: 9
Chloride: 103
Creatinine, Ser: 2.88 — ABNORMAL HIGH
GFR calc Af Amer: 26 — ABNORMAL LOW
Sodium: 140

## 2011-10-05 LAB — CBC
MCHC: 34.2
MCV: 92.3
Platelets: 188
RBC: 3.88 — ABNORMAL LOW
WBC: 6.6

## 2011-10-05 LAB — URINALYSIS, ROUTINE W REFLEX MICROSCOPIC
Bilirubin Urine: NEGATIVE
Ketones, ur: NEGATIVE
Nitrite: NEGATIVE
Protein, ur: >300 — AB
pH: 5.5

## 2011-10-05 LAB — DIFFERENTIAL
Basophils Absolute: 0
Basophils Relative: 0
Eosinophils Absolute: 0
Eosinophils Relative: 0
Monocytes Absolute: 0.3

## 2011-10-05 LAB — URINE CULTURE: Colony Count: 1000

## 2011-10-05 LAB — LIPASE, BLOOD: Lipase: 30

## 2011-10-06 NOTE — Procedures (Signed)
EEG NUMBER:  [id number].  This routine EEG was requested in this 77 year old man who has a history of normal pressure hydrocephalus with a right-sided VP shunt.  He is having spells of confusion.  Purpose of this EEG is to determine if these spells may be secondary to seizures.  MEDICATIONS:  Levetiracetam.   The EEG was done with the patient awake.  During periods of maximal wakefulness, background activities were characterized by low-amplitude poorly organized delta activities over the right hemisphere, over the left hemisphere, and low-amplitude theta activities.  There is no clear posterior dominant rhythm.  At the vertex, there was frequent runs of sharply contoured beta activity.  This seemed to wax and wane and lasted for up to 10-15 seconds.    Photic stimulation did not produce a driving response.  The patient was not hyperventilated.  The patient did not sleep.  CLINICAL INTERPRETATION:  This routine EEG done with the patient awake, he is abnormal.  Background activities are too slow suggesting a mild-to- moderate encephalopathy of nonspecific etiology.  In addition, the continuous arrhythmic delta activities over the right hemisphere suggest an underlying structural abnormality in that location.  Finally, the presence of waxing and waning, beta activities at the vertex are of uncertain significance.          ______________________________ Denton Meek, MD    [id number] D:  10/06/2011 10:50:58  T:  10/06/2011 14:14:50  Job #:  [id number]

## 2011-10-14 NOTE — Progress Notes (Signed)
[name], [name]                  ACCOUNT NO.:  [id number]  MEDICAL RECORD NO.:  [id number]  LOCATION:                                 FACILITY:  PHYSICIAN:  Wiktoria Hemrick Bosie Helper, MD      DATE OF BIRTH:  1933-08-05                                PROGRESS NOTE   PRIMARY CARE PHYSICIAN: Vikki Ports A. Felicity Coyer, MD.  CARDIOLOGIST: Doylene Canning. Ladona Ridgel, MD.  NEPHROLOGIST: Zetta Bills, MD.  Ted Mcalpine: Donalee Citrin, MD.  1. Altered mental status with left-sided hemiparesis likely secondary     to right brain stroke. 2. Dysphagia secondary to #1. 3. Febrile illness likely secondary to aspiration, status post lumbar     puncture, which is negative, blood culture x4 negative, and TEE     cannot be done. 4. End-stage renal disease, on hemodialysis, Monday, Wednesday, and     Friday. 5. History of chronic systolic heart failure, status post ICD     placement. 6. History of normal pressure hydrocephalus status post VP shunt. 7. Status post CABG. 8. Hypertension. 9. Diabetes mellitus. 10.Sinus tachycardia. 11.History of obstructive sleep apnea.  CONSULTATION: 1. Neuro consulted done by Dr. Pearlean Brownie. 2. Infectious Disease consulted done by Dr. Daiva Eves. 3. Nephrology consulted done by Dr. Zetta Bills. 4. Intervention Radiology. 5. Palliative Care consult is pending.  PROCEDURE: 1. CT head without contrast, no evidence of acute intracranial     abnormality, stable ventriculomegaly with high parietal approach,     ventriculostomy catheter. 2. Chest x-ray with aeration increased, diffuse air space disease. 3. CT angio of the head and neck, bulky calcified plaque at the right     ICA resulting stenosis up to 65% with affected distal vessel. 4. Mild left carotid artery sclerosis without hemodynamically     significant stenosis.  Atherosclerosis at the dominant left     vertebral artery origin without hemodynamically significant     stenosis.  Patchy air opacity in the posterior right lung,  recommend correlation with chest x-ray. 5. CT angio, no intracranial hemodynamically significant stenosis or     major branch occlusion.  Mild-to-moderate ICA saphenous     atherosclerosis.  Mild distal vertebral artery atherosclerosis.     Stable ventriculomegaly.  Stable right posterior approach CSF shunt     without adverse feature. 6. CT head, no acute intracranial pathology, persistent hydrocephalus,     appeared stable from the prior study on the right parietal     ventriculoperitoneal shunt seen in anterior position.  Diffuse     small-vessel ischemic microangiopathy and a stable mild     chondromalacia in the left frontal lobe. 7. Chest x-ray, no acute finding. 8. Abdominal x-ray.  HISTORY OF PRESENT ILLNESS: This is a 77 year old Caucasian male with extensive past medical history of end-stage renal disease, on hemodialysis Monday, Wednesday, and Friday, history of chronic systolic heart failure status post ICD placement, history of normal pressure hydrocephalus status post VP shunt, hypertension, diabetes mellitus, and coronary artery disease status post CABG and ICD.  He has been with confusion for the past 6 weeks.  He with intermittent altered mental status, but at this time where the patient  admitted because of left-sided weakness.  PHYSICAL EXAMINATION: VITAL SIGNS:  On examination in the emergency room, the patient has a vital signs, temperature 98.7, heart rate of 108, blood pressure 210/85, respiratory rate 20, pulse ox 95% on room. GENERAL:  On examination, the patient was very lethargic and confused, follow a very simple command. NEUROLOGIC:  He does have left facial droop and he has dysarthria.  He also has around 3 to 4 strength in his left upper extremity and his left lower extremity.  Left side hemiplegia, felt to be secondary to acute to subacute right brain stroke.  Unfortunately, MRI cannot be done.  CT head, angio, and neck was done, which suggest only  65% RCA stenosis, their discussion with Dr. Pearlean Brownie did not explain the patient's current presentation, but there is possibility of a small brain stroke that is not very clear on CT.  His carotid duplex did show EF 55%-60% and there is no evidence of vegetation.  Per discussion with Dr. Pearlean Brownie, recommend transesophageal echo and Cardio consulted.  A TEE done on August 20, but aborted secondary to apnea and desaturation after receiving Versed, and the patient transferred to 3100, the neuro ICU.  Causes of this patient's left-sided hemiparesis likely right brain stroke, not seen on CT scan. The patient placed on aspirin and Plavix on hold as the family would like to proceed with the PEG tube.  To rule out any underlying infection for the patient current presentation, blood culture was negative.  TEE cannot be done as the patient has some apneic and tachycardic episode, but anyhow blood culture was negative and Infectious Disease consulted. The patient underwent LP, report negative for any acute bacterial infection.  The VP shunt looking working in a stable position via the CT finding.  Currently, the patient is still lethargic, follows simple command, he speaks but not clearly.  Dialysis continued to be resumed. This case was discussed with the wife and daughter and the patient is scheduled to do PEG tube, but the family at this time requesting palliative care consult for goal of care.  The wife leaning towards stopping hemodialysis and make the patient complete comfort care. Palliative care consulted accordingly and PEG tube on hold as the wife's quality of life will be different with his confusion and current hemiparesis and dialysis.  My feeling is that major cause of patient's current presentation is right ICA stenosis.  Currently, we will transfer the patient from 3100 to neuro floor. We will continue with controlling his heart rate with beta-blocker IV and controlling his blood  pressure with clonidine patch.  We will continue with a current dose of Lantus and we will continue antibiotics until further recommendation from Dr. Daiva Eves.  Also, the patient has episode of looked like a small seizure where the patient bite his tongue with some blood and currently pending palliative care meeting with the family to address his goal of care per family request.     Rykar Lebleu Bosie Helper, MD     HIE/MEDQ  D:  08/15/2011  T:  08/15/2011  Job:  [id number]  cc:   Vikki Ports A. Felicity Coyer, MD [address] [place], [place] [address]  Zetta Bills, MD Fax: [phone]  Electronically Signed by Ebony Cargo MD on 10/14/2011 08:11:34 PM

## 2011-10-16 ENCOUNTER — Telehealth: Payer: Self-pay | Admitting: Neurology

## 2011-10-16 MED ORDER — LEVETIRACETAM 500 MG PO TABS: 500.0000 mg | ORAL_TABLET | Freq: Two times a day (BID) | ORAL | Status: DC

## 2011-10-16 NOTE — Telephone Encounter (Signed)
Pt needs the Keppra that he was prescribed during his hospital stay to be refilled. Daughter said it was 500mg  2 times a day, and asked if you could write him an rx for the meds, per your last discussion to continue taking them.

## 2011-10-16 NOTE — Telephone Encounter (Signed)
I have e-scripted in refill.

## 2011-10-17 ENCOUNTER — Telehealth: Payer: Self-pay

## 2011-10-17 NOTE — Telephone Encounter (Signed)
Reduce NPH to 55U at bedtime - continue same meal coverage - call if continued cbg <70 - thanks

## 2011-10-17 NOTE — Telephone Encounter (Signed)
What do you advise they reduce NPH insulin to?

## 2011-10-17 NOTE — Telephone Encounter (Signed)
i agree. 

## 2011-10-17 NOTE — Telephone Encounter (Signed)
Pt's spouse is hesitant to reduce NPH to 55 u because pt is currently taking 60 units and is still having lows. Spouse is requesting  SAE review and advise, pt has appt 10/25 with SAE

## 2011-10-17 NOTE — Telephone Encounter (Signed)
Pt's spouse called stating pt has been experiencing low blood sugars X 8 days, lowest 44 this morning. Pt is having some mild tiredness per wife. Spouse is requesting insulin adjustments

## 2011-10-17 NOTE — Telephone Encounter (Signed)
Please reduce to 55 units qhs

## 2011-10-18 NOTE — Telephone Encounter (Signed)
Pt's spouse informed 

## 2011-10-19 ENCOUNTER — Encounter: Payer: Self-pay | Admitting: Endocrinology

## 2011-10-19 ENCOUNTER — Ambulatory Visit (INDEPENDENT_AMBULATORY_CARE_PROVIDER_SITE_OTHER): Payer: Medicare Other | Admitting: Endocrinology

## 2011-10-19 VITALS — BP 126/62 | HR 110 | Temp 97.6°F | Ht 76.0 in | Wt 186.0 lb

## 2011-10-19 DIAGNOSIS — E109 Type 1 diabetes mellitus without complications: Secondary | ICD-10-CM

## 2011-10-19 NOTE — Progress Notes (Signed)
Subjective:    Patient ID: [name], male    DOB: 1933/07/22, 77 y.o.   MRN: [id number]  HPI no cbg record, but states cbg's are sometimes low before breakfast.  Wife called, and hs-nph was reduce to 55 units qhs.  despite this, he has continued to have hypoglycemia before breakfast.  Wife says it is difficult for her to interpret sxs, in view of his memory loss.  Past Medical History  Diagnosis Date  . SLEEP APNEA, OBSTRUCTIVE     CPAP qhs  . VITAMIN B12 DEFICIENCY   . PARKINSON'S DISEASE   . CORONARY ARTERY DISEASE     s/p  CABG 2003-cath 12/31/09-occluded sap dx  graft, otherwise patent grafts  . Chronic systolic heart failure     LVEF 35-40% by cath 12/31/09 s/p BiV ICD 07/2010  . Gastroparesis   . CORONARY ARTERY BYPASS GRAFT, THREE VESSEL, HX OF 2003  . AUTOMATIC IMPLANTABLE CARDIAC DEFIBRILLATOR SITU 07/2010 BiV ICD  . ANEMIA, MACROCYTIC   . DEPRESSION   . DIABETES MELLITUS, TYPE I   . GERD   . HYPERCHOLESTEROLEMIA   . HYPERTENSION   . RENAL FAILURE, END STAGE     HD MWD a/w anemia of chronic dz, hypotension  . PERIPHERAL NEUROPATHY   . History of colonoscopy   . Renal insufficiency   . Cancer   . Collagen vascular disease   . CVA (cerebral vascular accident) 07/2011    L HP and dysphagia, improved    Past Surgical History  Procedure Date  . Coronary artery bypass graft   . Cataract extraction, bilateral   . Pacemaker placement     permanent  . Dialysis fistula creation   . Shunt replacement     History   Social History  . Marital Status: Married    Spouse Name: N/A    Number of Children: 2  . Years of Education: N/A   Occupational History  . retired    Social History Main Topics  . Smoking status: Never Smoker   . Smokeless tobacco: Never Used   Comment: Married, lives with spouse, supportive dtr and son nearby. Pt is on dialysis  . Alcohol Use: No  . Drug Use: No  . Sexually Active: Not on file   Other Topics Concern  . Not on file   Social  History Narrative   Pt does not get regular exercise.Pt is on dialysis-MWF_since 10/2010Lives with spouse, supportive dtr and son nearby    Current Outpatient Prescriptions on File Prior to Visit  Medication Sig Dispense Refill  . acetaminophen (TYLENOL) 325 MG tablet as needed.        Marland Kitchen aspirin 81 MG tablet Take 81 mg by mouth daily.        Marland Kitchen atorvastatin (LIPITOR) 20 MG tablet Take 20 mg by mouth daily.        . betamethasone dipropionate 0.05 % lotion Apply topically 2 (two) times daily as needed.        . calcium acetate (PHOSLO) 667 MG capsule 3 (three) times daily. 1 capsule at dinner      . camphor-menthol (SARNA) lotion Apply topically. As directed       . citalopram (CELEXA) 20 MG tablet Take 20 mg by mouth daily.        Marland Kitchen docusate sodium (COLACE) 100 MG capsule as needed.       Marland Kitchen esomeprazole (NEXIUM) 40 MG capsule Take 40 mg by mouth 2 (two) times daily.        Marland Kitchen  glucagon (GLUCAGON EMERGENCY) 1 MG injection Use as directed       . hydrOXYzine (ATARAX) 25 MG tablet TAKE AS DIRECTED THE MORNING OF DIALYSIS  30 tablet  1  . insulin lispro (HUMALOG) 100 UNIT/ML injection Inject four times daily: 15-10-30-15 units      . insulin NPH (HUMULIN N PEN) 100 UNIT/ML injection 40 Units at bedtime.   10 mL    . Insulin Pen Needle (B-D ULTRAFINE III SHORT PEN) 31G X 8 MM MISC Use as directed       . isosorbide mononitrate (IMDUR) 60 MG 24 hr tablet Take 60 mg by mouth daily.        Marland Kitchen levETIRAcetam (KEPPRA) 500 MG tablet Take 1 tablet (500 mg total) by mouth 2 (two) times daily.  60 tablet  6  . metoprolol (TOPROL-XL) 50 MG 24 hr tablet Take 1 tablet (50 mg total) by mouth daily. 1 tablet daily  30 tablet  0  . multivitamin (RENA-VIT) TABS tablet Take 1 tablet by mouth daily.        . NON FORMULARY CPAP  For obstructive sleep apnea       . NON FORMULARY Dialysis  On Mon Wed Fri       . Nutritional Supplements (NEPRO) LIQD Take by mouth daily.        . ondansetron (ZOFRAN) 4 MG tablet Take  1 tablet (4 mg total) by mouth every 8 (eight) hours as needed for nausea. As directed  30 tablet  1  . prasugrel (EFFIENT) 10 MG TABS Take 1 tablet (10 mg total) by mouth daily.  30 tablet    . promethazine (PHENERGAN) 12.5 MG tablet as needed.        . ranolazine (RANEXA) 500 MG 12 hr tablet Take 500 mg by mouth daily.        . traMADol (ULTRAM) 50 MG tablet Take 1 by mouth as needed for back pain      . zolpidem (AMBIEN) 5 MG tablet Take 5 mg by mouth at bedtime as needed.          Allergies  Allergen Reactions  . Nifedipine     Family History  Problem Relation Age of Onset  . Stroke Mother   . Diabetes Father   . Lung cancer Father   . Diabetes Brother   . Stomach cancer Paternal Aunt   . Colon cancer Neg Hx    BP 126/62  Pulse 110  Temp(Src) 97.6 F (36.4 C) (Oral)  Ht 6\' 4"  (1.93 m)  Wt 186 lb (84.369 kg)  BMI 22.64 kg/m2  SpO2 98% Review of Systems Denies loc.    Objective:   Physical Exam VITAL SIGNS:  See vs page. GENERAL: no distress. Pulses: dorsalis pedis intact bilat, but decreased from normal. Feet: no deformity.  no ulcer on the feet.  feet are of normal color and temp.  no edema Neuro: sensation is intact to touch on the feet.     Lab Results  Component Value Date   HGBA1C 7.0* 08/09/2011      Assessment & Plan:  Dm, well-controlled

## 2011-10-19 NOTE — Patient Instructions (Addendum)
Please continue humalog (just before each meal) 15-10-30-15 units (the 15 units is if you eat a bedtime snack).  take extra novolog 5 units as needed for any sugar over 300.  Please schedule a follow-up appointment in 2 month. reduce nph to 40 units units at bedtime only. check your blood sugar 4 times a day--before the 3 meals, and at bedtime.  also check if you have symptoms of your blood sugar being too high or too low.  please keep a record of the readings and bring it to your next appointment here.  please call us sooner if you are having low blood sugar episode.   Please call if you would like to have your leg/foot circulation tested.   Sorry, you dont't qualify for diabetic shoes.

## 2011-10-23 ENCOUNTER — Other Ambulatory Visit: Payer: Self-pay | Admitting: Internal Medicine

## 2011-10-24 ENCOUNTER — Telehealth: Payer: Self-pay

## 2011-10-24 NOTE — Telephone Encounter (Signed)
ok 

## 2011-10-24 NOTE — Telephone Encounter (Signed)
Occupational  therapists requested verbal to extend visit to pt by 1 additional visits. Verbal authorization given, okay?

## 2011-10-26 ENCOUNTER — Encounter: Payer: Self-pay | Admitting: Internal Medicine

## 2011-10-26 ENCOUNTER — Ambulatory Visit (INDEPENDENT_AMBULATORY_CARE_PROVIDER_SITE_OTHER): Payer: Medicare Other | Admitting: Internal Medicine

## 2011-10-26 DIAGNOSIS — R000 Tachycardia, unspecified: Secondary | ICD-10-CM

## 2011-10-26 DIAGNOSIS — I428 Other cardiomyopathies: Secondary | ICD-10-CM

## 2011-10-26 DIAGNOSIS — I5022 Chronic systolic (congestive) heart failure: Secondary | ICD-10-CM

## 2011-10-26 DIAGNOSIS — Z95810 Presence of automatic (implantable) cardiac defibrillator: Secondary | ICD-10-CM

## 2011-10-26 LAB — ICD DEVICE OBSERVATION
AL IMPEDENCE ICD: 494 Ohm
AL THRESHOLD: 1.5 V
ATRIAL PACING ICD: 1 pct
LV LEAD THRESHOLD: 0.875 V
RV LEAD IMPEDENCE ICD: 589 Ohm
RV LEAD THRESHOLD: 0.625 V
TZAT-0001ATACH: 1
TZAT-0001ATACH: 2
TZAT-0001SLOWVT: 1
TZAT-0001SLOWVT: 2
TZAT-0002ATACH: NEGATIVE
TZAT-0002ATACH: NEGATIVE
TZAT-0002FASTVT: NEGATIVE
TZAT-0004SLOWVT: 8
TZAT-0004SLOWVT: 8
TZAT-0005SLOWVT: 88 pct
TZAT-0005SLOWVT: 91 pct
TZAT-0011SLOWVT: 10 ms
TZAT-0011SLOWVT: 10 ms
TZAT-0018ATACH: NEGATIVE
TZAT-0018ATACH: NEGATIVE
TZAT-0018FASTVT: NEGATIVE
TZAT-0018SLOWVT: NEGATIVE
TZAT-0019ATACH: 6 V
TZAT-0019ATACH: 6 V
TZAT-0019FASTVT: 8 V
TZAT-0020FASTVT: 1.5 ms
TZON-0004VSLOWVT: 20
TZST-0001FASTVT: 3
TZST-0001FASTVT: 5
TZST-0001FASTVT: 6
TZST-0001SLOWVT: 3
TZST-0001SLOWVT: 6
TZST-0002ATACH: NEGATIVE
TZST-0002FASTVT: NEGATIVE
TZST-0002FASTVT: NEGATIVE
TZST-0003SLOWVT: 20 J
TZST-0003SLOWVT: 35 J

## 2011-10-26 NOTE — Assessment & Plan Note (Signed)
HIs device is working normally. He will follow up in several months.

## 2011-10-26 NOTE — Patient Instructions (Signed)
Your physician wants you to follow-up in: 12 months with Dr Court Joy will receive a reminder letter in the mail two months in advance. If you don't receive a letter, please call our office to schedule the follow-up appointment.   Remote monitoring is used to monitor your Pacemaker of ICD from home. This monitoring reduces the number of office visits required to check your device to one time per year. It allows [place] to keep an eye on the functioning of your device to ensure it is working properly. You are scheduled for a device check from home on 01/25/12. You may send your transmission at any time that day. If you have a wireless device, the transmission will be sent automatically. After your physician reviews your transmission, you will receive a postcard with your next transmission date.   Your physician has recommended you make the following change in your medication:  1) Increase your Metoprolol to 50mg   1/1/2 tablets daily

## 2011-10-26 NOTE — Assessment & Plan Note (Signed)
His blood pressures have been a bit high. I have recommended up titration of his beta blocker to 75 mg daily. We'll follow.

## 2011-10-26 NOTE — Assessment & Plan Note (Signed)
He has had persistent sinus tachycardia between 100 and 115 beats per minute now for over a year. The etiology is unclear. Hopefully up titration of his beta blocker will help control his sinus rate.

## 2011-10-26 NOTE — Progress Notes (Signed)
HPI  Allergies  Allergen Reactions  . Nifedipine      Current Outpatient Prescriptions  Medication Sig Dispense Refill  . acetaminophen (TYLENOL) 325 MG tablet as needed.        Marland Kitchen aspirin 81 MG tablet Take 81 mg by mouth daily.        Marland Kitchen atorvastatin (LIPITOR) 20 MG tablet Take 20 mg by mouth daily.        . betamethasone dipropionate 0.05 % lotion Apply topically 2 (two) times daily as needed.        . calcium acetate (PHOSLO) 667 MG capsule 3 (three) times daily. 1 capsule at dinner      . camphor-menthol (SARNA) lotion Apply topically. As directed       . citalopram (CELEXA) 20 MG tablet Take 20 mg by mouth daily.        Marland Kitchen docusate sodium (COLACE) 100 MG capsule as needed.       Marland Kitchen esomeprazole (NEXIUM) 40 MG capsule Take 40 mg by mouth 2 (two) times daily.        Marland Kitchen glucagon (GLUCAGON EMERGENCY) 1 MG injection Use as directed       . hydrOXYzine (ATARAX) 25 MG tablet TAKE AS DIRECTED THE MORNING OF DIALYSIS  30 tablet  1  . insulin lispro (HUMALOG) 100 UNIT/ML injection Inject four times daily: 15-10-30-15 units      . insulin NPH (HUMULIN N PEN) 100 UNIT/ML injection 40 Units at bedtime.   10 mL    . Insulin Pen Needle (B-D ULTRAFINE III SHORT PEN) 31G X 8 MM MISC Use as directed       . isosorbide mononitrate (IMDUR) 60 MG 24 hr tablet Take 60 mg by mouth daily.        Marland Kitchen levETIRAcetam (KEPPRA) 500 MG tablet Take 1 tablet (500 mg total) by mouth 2 (two) times daily.  60 tablet  6  . metoprolol (TOPROL-XL) 50 MG 24 hr tablet TAKE 1 TABLET BY MOUTH EVERY DAY  30 tablet  3  . multivitamin (RENA-VIT) TABS tablet Take 1 tablet by mouth daily.        . nitroGLYCERIN (NITROSTAT) 0.4 MG SL tablet Place 0.4 mg under the tongue as needed.        . NON FORMULARY CPAP  For obstructive sleep apnea       . NON FORMULARY Dialysis  On Mon Wed Fri epogen 3800units,  infed 50mg ,  zemplar 2 mcg      . Nutritional Supplements (NEPRO) LIQD Take by mouth daily.        . ondansetron (ZOFRAN) 4 MG  tablet Take 1 tablet (4 mg total) by mouth every 8 (eight) hours as needed for nausea. As directed  30 tablet  1  . prasugrel (EFFIENT) 10 MG TABS Take 1 tablet (10 mg total) by mouth daily.  30 tablet    . promethazine (PHENERGAN) 12.5 MG tablet as needed.        . ranolazine (RANEXA) 500 MG 12 hr tablet Take 500 mg by mouth daily.        . traMADol (ULTRAM) 50 MG tablet Take 1 by mouth as needed for back pain      . zolpidem (AMBIEN) 5 MG tablet Take 5 mg by mouth at bedtime as needed.           Past Medical History  Diagnosis Date  . SLEEP APNEA, OBSTRUCTIVE     CPAP qhs  . VITAMIN B12 DEFICIENCY   .  PARKINSON'S DISEASE   . CORONARY ARTERY DISEASE     s/p  CABG 2003-cath 12/31/09-occluded sap dx  graft, otherwise patent grafts  . Chronic systolic heart failure     LVEF 35-40% by cath 12/31/09 s/p BiV ICD 07/2010  . Gastroparesis   . CORONARY ARTERY BYPASS GRAFT, THREE VESSEL, HX OF 2003  . AUTOMATIC IMPLANTABLE CARDIAC DEFIBRILLATOR SITU 07/2010 BiV ICD  . ANEMIA, MACROCYTIC   . DEPRESSION   . DIABETES MELLITUS, TYPE I   . GERD   . HYPERCHOLESTEROLEMIA   . HYPERTENSION   . RENAL FAILURE, END STAGE     HD MWD a/w anemia of chronic dz, hypotension  . PERIPHERAL NEUROPATHY   . History of colonoscopy   . Renal insufficiency   . Cancer   . Collagen vascular disease   . CVA (cerebral vascular accident) 07/2011    L HP and dysphagia, improved    ROS:   All systems reviewed and negative except as noted in the HPI.   Past Surgical History  Procedure Date  . Coronary artery bypass graft   . Cataract extraction, bilateral   . Pacemaker placement     permanent  . Dialysis fistula creation   . Shunt replacement   . Doppler echocardiography 2005, 2008, 2010, &2012     Family History  Problem Relation Age of Onset  . Stroke Mother   . Diabetes Father   . Lung cancer Father   . Diabetes Brother   . Stomach cancer Paternal Aunt   . Colon cancer Neg Hx      History    Social History  . Marital Status: Married    Spouse Name: N/A    Number of Children: 2  . Years of Education: N/A   Occupational History  . retired    Social History Main Topics  . Smoking status: Never Smoker   . Smokeless tobacco: Never Used   Comment: Married, lives with spouse, supportive dtr and son nearby. Pt is on dialysis  . Alcohol Use: No  . Drug Use: No  . Sexually Active: Not on file   Other Topics Concern  . Not on file   Social History Narrative   Pt does not get regular exercise.Pt is on dialysis-MWF_since 10/2010Lives with spouse, supportive dtr and son nearby     BP 128/60  Pulse 116  Ht 6\' 1"  (1.854 m)  Wt 194 lb (87.998 kg)  BMI 25.60 kg/m2  Physical Exam:  Well appearing NAD HEENT: Unremarkable Neck:  No JVD, no thyromegally Lymphatics:  No adenopathy Back:  No CVA tenderness Lungs:  Clear HEART:  Regular rate rhythm, no murmurs, no rubs, no clicks Abd:  soft, positive bowel sounds, no organomegally, no rebound, no guarding Ext:  2 plus pulses, no edema, no cyanosis, no clubbing Skin:  No rashes no nodules Neuro:  CN II through XII intact, motor grossly intact  DEVICE  Normal device function.  See PaceArt for details.   Assess/Plan:

## 2011-10-26 NOTE — Assessment & Plan Note (Signed)
His symptoms are well controlled. His fluid index is low. He is dialyzing 3 times a week without difficulty. I have encouraged him to maintain a low-sodium diet. We will plan to up titrate his beta blocker.

## 2011-11-06 ENCOUNTER — Other Ambulatory Visit: Payer: Self-pay | Admitting: Internal Medicine

## 2011-11-06 NOTE — Telephone Encounter (Signed)
Done

## 2011-11-09 ENCOUNTER — Ambulatory Visit: Payer: Medicare Other | Admitting: Vascular Surgery

## 2011-11-09 ENCOUNTER — Ambulatory Visit: Payer: Medicare Other | Admitting: Neurology

## 2011-11-13 ENCOUNTER — Other Ambulatory Visit: Payer: Self-pay | Admitting: Gastroenterology

## 2011-11-15 ENCOUNTER — Encounter: Payer: Self-pay | Admitting: Internal Medicine

## 2011-11-15 ENCOUNTER — Ambulatory Visit: Payer: Medicare Other | Admitting: Neurology

## 2011-11-15 ENCOUNTER — Telehealth: Payer: Self-pay | Admitting: *Deleted

## 2011-11-15 NOTE — Telephone Encounter (Signed)
MD received letter from family insurance has denied payment for EMS service. They are trying to appeal letter. Requesting md to write a letter of medical necessity. Called pt no anwser [facility] letter is ready for pick-up...11/15/11@2 :35pm/LMB

## 2011-11-23 ENCOUNTER — Ambulatory Visit (INDEPENDENT_AMBULATORY_CARE_PROVIDER_SITE_OTHER): Payer: Medicare Other | Admitting: Neurology

## 2011-11-23 ENCOUNTER — Encounter: Payer: Self-pay | Admitting: Neurology

## 2011-11-23 ENCOUNTER — Ambulatory Visit: Payer: Medicare Other | Admitting: Neurology

## 2011-11-23 VITALS — BP 126/60 | HR 96 | Wt 195.6 lb

## 2011-11-23 DIAGNOSIS — R569 Unspecified convulsions: Secondary | ICD-10-CM

## 2011-11-23 MED ORDER — LEVETIRACETAM 500 MG PO TABS: ORAL_TABLET | ORAL | Status: DC

## 2011-11-23 NOTE — Progress Notes (Signed)
Dear Dr. Felicity Jones,  I saw  [name] back in [name] for his problem with transient spells of confusion.  As you may recall, he is a 77 y.o. year old male with a history of renal failure with dialysis dependence, normal pressure hydrocephalus s/p VP shunt who has been having repeated spells of confusion lasting from 20 minutes to a day and a half since July.  He did have a very prolonged spell of confusion in August which was associated with a fever.  No obvious etiology was found at that time.  I was suspicious these spells were possible seizures.  I repeated an EEG which revealed a mild to moderate encephalopathy and diffuse slowing over the right hemisphere, likely related to his shunt placement.  In addition he had waxing and waning beta activities at the vertex of uncertain significance.  He had been discharged on Keppra 500 bid.  I kept him on that dose.  Since he was last seen he has had one prolonged spell of confusion after a lower pressure episode in the renal unit.  This lasted 10 days.  In addition, he has been having spells where he looks like he is acting out his dreams during naps(appears to be working with his hands).  These occur daily.  He also has rarer spells where he appears to be awake picking at things in the air or on his lap(these occur about 1 per week).   Medical history, social history, and family history were reviewed and have not changed since the last clinic visit.  Current Outpatient Prescriptions on File Prior to Visit  Medication Sig Dispense Refill  . acetaminophen (TYLENOL) 325 MG tablet as needed.        Marland Kitchen aspirin 81 MG tablet Take 81 mg by mouth daily.        Marland Kitchen atorvastatin (LIPITOR) 20 MG tablet Take 20 mg by mouth daily.        . betamethasone dipropionate 0.05 % lotion Apply topically 2 (two) times daily as needed.        . calcium acetate (PHOSLO) 667 MG capsule 3 (three) times daily. 1 capsule at dinner      . camphor-menthol (SARNA)  lotion Apply topically. As directed       . citalopram (CELEXA) 20 MG tablet Take 20 mg by mouth daily.        Marland Kitchen docusate sodium (COLACE) 100 MG capsule as needed.       Marland Kitchen glucagon (GLUCAGON EMERGENCY) 1 MG injection Use as directed       . hydrOXYzine (ATARAX/VISTARIL) 25 MG tablet TAKE AS DIRECTED THE MORNING OF DIALYSIS  30 tablet  1  . insulin lispro (HUMALOG) 100 UNIT/ML injection Inject four times daily: 15-10-30-15 units      . insulin NPH (HUMULIN N PEN) 100 UNIT/ML injection 40 Units at bedtime.   10 mL    . Insulin Pen Needle (B-D ULTRAFINE III SHORT PEN) 31G X 8 MM MISC Use as directed       . isosorbide mononitrate (IMDUR) 60 MG 24 hr tablet Take 60 mg by mouth daily.        . metoprolol (TOPROL-XL) 50 MG 24 hr tablet Take 1 1/2 tablets daily  45 tablet  11  . multivitamin (RENA-VIT) TABS tablet Take 1 tablet by mouth daily.        Marland Kitchen NEXIUM 40 MG capsule TAKE ONE CAPSULE BY MOUTH TWICE DAILY  60 capsule  6  .  nitroGLYCERIN (NITROSTAT) 0.4 MG SL tablet Place 0.4 mg under the tongue as needed.        . NON FORMULARY CPAP  For obstructive sleep apnea       . NON FORMULARY Dialysis  On Mon Wed Fri epogen 3800units,  infed 50mg ,  zemplar 2 mcg      . Nutritional Supplements (NEPRO) LIQD Take by mouth daily.        . ondansetron (ZOFRAN) 4 MG tablet TAKE 1 TABLET BY MOUTH EVERY 8 HOURS AS NEEDED FOR NAUSEA  30 tablet  1  . prasugrel (EFFIENT) 10 MG TABS Take 1 tablet (10 mg total) by mouth daily.  30 tablet    . promethazine (PHENERGAN) 12.5 MG tablet as needed.        . ranolazine (RANEXA) 500 MG 12 hr tablet Take 500 mg by mouth daily.        . traMADol (ULTRAM) 50 MG tablet Take 1 by mouth as needed for back pain      . zolpidem (AMBIEN) 5 MG tablet Take 5 mg by mouth at bedtime as needed.          Allergies  Allergen Reactions  . Nifedipine   . Tape     ROS:  13 systems were reviewed and are notable for significant short term memory problems.  Steady gait though.  All  other review of systems are unremarkable.  Exam: . Filed Vitals:   11/23/11 1048  BP: 126/60  Pulse: 96  Weight: 195 lb 9.6 oz (88.724 kg)   Impression/Recs:  25 y old man with multiple medical problems having confusions spells as well as spells of hand automatisms, concerning for, but certainly not confirmed as seizures.  It is equally as likely these confusional spells are due to his underlying brain pathology related to his NPH exacerbated by such medical problems as low blood pressure.  The "picking spells" are more interesting.  I am going to increase his Keppra by adding 500mg  after dialysis, given 50% is dialyzed off.  His family is going to call me if he has a confusional spell and we may repeat an EEG at that time.  I would actually consider monitoring him for 24 hours to see if I can catch any subclinical seizures.   We will see the patient back in 6-8 weeks.  Lupita Raider Modesto Charon, MD [facility], [place]

## 2011-11-23 NOTE — Patient Instructions (Signed)
Your prescription has been sent to your pharmacy.  We will see you back on January 17th, 2013 at 10:30am.  Have a great Christmas and New Year.

## 2011-12-04 ENCOUNTER — Other Ambulatory Visit: Payer: Self-pay | Admitting: Internal Medicine

## 2011-12-11 ENCOUNTER — Other Ambulatory Visit: Payer: Self-pay | Admitting: Cardiovascular Disease

## 2011-12-11 ENCOUNTER — Telehealth: Payer: Self-pay | Admitting: Internal Medicine

## 2011-12-11 NOTE — Telephone Encounter (Signed)
lmom for daughter to call me back  HR of 54 is not bad it would depend on her symptoms

## 2011-12-11 NOTE — Telephone Encounter (Signed)
New message:  Daughter called and stated he is having issues with neurological episodes.  Heart Rate is running very low and needs to know what are the guidelines they need to follow to call EMS?  HR was 54.

## 2011-12-12 ENCOUNTER — Encounter: Payer: Self-pay | Admitting: Endocrinology

## 2011-12-12 ENCOUNTER — Other Ambulatory Visit (INDEPENDENT_AMBULATORY_CARE_PROVIDER_SITE_OTHER): Payer: Medicare Other

## 2011-12-12 ENCOUNTER — Ambulatory Visit: Payer: Medicare Other | Admitting: Internal Medicine

## 2011-12-12 ENCOUNTER — Ambulatory Visit (INDEPENDENT_AMBULATORY_CARE_PROVIDER_SITE_OTHER): Payer: Medicare Other | Admitting: Endocrinology

## 2011-12-12 VITALS — BP 108/64 | HR 99 | Temp 97.4°F | Ht 73.0 in | Wt 194.2 lb

## 2011-12-12 DIAGNOSIS — K219 Gastro-esophageal reflux disease without esophagitis: Secondary | ICD-10-CM

## 2011-12-12 DIAGNOSIS — E109 Type 1 diabetes mellitus without complications: Secondary | ICD-10-CM

## 2011-12-12 DIAGNOSIS — R1312 Dysphagia, oropharyngeal phase: Secondary | ICD-10-CM

## 2011-12-12 LAB — HEMOGLOBIN A1C: Hgb A1c MFr Bld: 6.9 % — ABNORMAL HIGH (ref 4.6–6.5)

## 2011-12-12 MED ORDER — INSULIN NPH (HUMAN) (ISOPHANE) 100 UNIT/ML ~~LOC~~ SUSP: 45.0000 [IU] | Freq: Every day | SUBCUTANEOUS | Status: DC

## 2011-12-12 MED ORDER — INSULIN LISPRO 100 UNIT/ML ~~LOC~~ SOLN: SUBCUTANEOUS | Status: DC

## 2011-12-12 MED ORDER — INSULIN PEN NEEDLE 31G X 8 MM MISC: Status: AC

## 2011-12-12 NOTE — Progress Notes (Signed)
Subjective:    Patient ID: [name], male    DOB: [birth year], 77 y.o.   MRN: [id number]  HPI Pt returns for f/u of insulin-requiring DM (1977).  he brings a record of his cbg's which i have reviewed today.  it varies from 100-300.  It is as low as 100 at all times of day, except for am.   Past Medical History  Diagnosis Date  . SLEEP APNEA, OBSTRUCTIVE     CPAP qhs  . VITAMIN B12 DEFICIENCY   . PARKINSON'S DISEASE   . CORONARY ARTERY DISEASE     s/p  CABG 2003-cath 12/31/09-occluded sap dx  graft, otherwise patent grafts  . Chronic systolic heart failure     LVEF 35-40% by cath 12/31/09 s/p BiV ICD 07/2010  . Gastroparesis   . CORONARY ARTERY BYPASS GRAFT, THREE VESSEL, HX OF 2003  . AUTOMATIC IMPLANTABLE CARDIAC DEFIBRILLATOR SITU 07/2010 BiV ICD  . ANEMIA, MACROCYTIC   . DEPRESSION   . DIABETES MELLITUS, TYPE I   . GERD   . HYPERCHOLESTEROLEMIA   . HYPERTENSION   . RENAL FAILURE, END STAGE     HD MWD a/w anemia of chronic dz, hypotension  . PERIPHERAL NEUROPATHY   . History of colonoscopy   . Renal insufficiency   . Cancer   . Collagen vascular disease   . CVA (cerebral vascular accident) 07/2011    L HP and dysphagia, improved    Past Surgical History  Procedure Date  . Coronary artery bypass graft   . Cataract extraction, bilateral   . Pacemaker placement     permanent  . Dialysis fistula creation   . Shunt replacement   . Doppler echocardiography 2005, 2008, 2010, &2012    History   Social History  . Marital Status: Married    Spouse Name: N/A    Number of Children: 2  . Years of Education: N/A   Occupational History  . retired    Social History Main Topics  . Smoking status: Never Smoker   . Smokeless tobacco: Never Used   Comment: Married, lives with spouse, supportive dtr and son nearby. Pt is on dialysis  . Alcohol Use: No  . Drug Use: No  . Sexually Active: Not on file   Other Topics Concern  . Not on file   Social History Narrative   Pt  does not get regular exercise.Pt is on dialysis-MWF_since 10/2010Lives with spouse, supportive dtr and son nearby    Current Outpatient Prescriptions on File Prior to Visit  Medication Sig Dispense Refill  . acetaminophen (TYLENOL) 325 MG tablet as needed.        Marland Kitchen aspirin 81 MG tablet Take 81 mg by mouth daily.        Marland Kitchen atorvastatin (LIPITOR) 20 MG tablet Take 20 mg by mouth daily.        . betamethasone dipropionate 0.05 % lotion Apply topically 2 (two) times daily as needed.        . calcium acetate (PHOSLO) 667 MG capsule 3 (three) times daily. 1 capsule at dinner      . camphor-menthol (SARNA) lotion Apply topically. As directed       . citalopram (CELEXA) 20 MG tablet Take 20 mg by mouth daily.        Marland Kitchen docusate sodium (COLACE) 100 MG capsule as needed.       Marland Kitchen glucagon (GLUCAGON EMERGENCY) 1 MG injection Use as directed       . hydrOXYzine (ATARAX/VISTARIL)  25 MG tablet TAKE AS DIRECTED THE MORNING OF DIALYSIS  30 tablet  1  . insulin lispro (HUMALOG) 100 UNIT/ML injection Inject four times daily: 15-10-30-15 units  75 mL  3  . insulin NPH (HUMULIN N PEN) 100 UNIT/ML injection Inject 45 Units into the skin at bedtime.  45 mL  3  . Insulin Pen Needle (B-D ULTRAFINE III SHORT PEN) 31G X 8 MM MISC Use as directed five times daily  600 each  3  . isosorbide mononitrate (IMDUR) 60 MG 24 hr tablet Take 60 mg by mouth daily.        Marland Kitchen levETIRAcetam (KEPPRA) 500 MG tablet take 1 in the a.m, 1 at night and 1 after every dialysis session  75 tablet  6  . metoprolol (TOPROL-XL) 50 MG 24 hr tablet Take 1 1/2 tablets daily  45 tablet  11  . multivitamin (RENA-VIT) TABS tablet Take 1 tablet by mouth daily.        Marland Kitchen NEXIUM 40 MG capsule TAKE ONE CAPSULE BY MOUTH TWICE DAILY  60 capsule  6  . nitroGLYCERIN (NITROSTAT) 0.4 MG SL tablet Place 0.4 mg under the tongue as needed.        . NON FORMULARY CPAP  For obstructive sleep apnea       . NON FORMULARY Dialysis  On Mon Wed Fri epogen 3800units,   infed 50mg ,  zemplar 2 mcg      . Nutritional Supplements (NEPRO) LIQD Take by mouth daily.        . ondansetron (ZOFRAN) 4 MG tablet TAKE 1 TABLET BY MOUTH EVERY 8 HOURS AS NEEDED FOR NAUSEA  30 tablet  1  . prasugrel (EFFIENT) 10 MG TABS Take 1 tablet (10 mg total) by mouth daily.  30 tablet    . promethazine (PHENERGAN) 12.5 MG tablet as needed.        Marland Kitchen RANEXA 500 MG 12 hr tablet TAKE 1 TABLET BY MOUTH TWICE DAILY  60 tablet  0  . traMADol (ULTRAM) 50 MG tablet Take 1 by mouth as needed for back pain      . zolpidem (AMBIEN) 5 MG tablet Take 5 mg by mouth at bedtime as needed.          Allergies  Allergen Reactions  . Nifedipine   . Tape     Family History  Problem Relation Age of Onset  . Stroke Mother   . Diabetes Father   . Lung cancer Father   . Diabetes Brother   . Stomach cancer Paternal Aunt   . Colon cancer Neg Hx     BP 108/64  Pulse 99  Temp(Src) 97.4 F (36.3 C) (Oral)  Ht 6\' 1"  (1.854 m)  Wt 194 lb 3.2 oz (88.089 kg)  BMI 25.62 kg/m2  SpO2 97%  Review of Systems denies hypoglycemia    Objective:   Physical Exam VITAL SIGNS:  See vs page GENERAL: no distress SKIN:  Insulin injection sites at the anterior abdomen are normal.     Lab Results  Component Value Date   HGBA1C 6.9* 12/12/2011      Assessment & Plan:  DM well-controlled

## 2011-12-12 NOTE — Patient Instructions (Addendum)
Please continue humalog (just before each meal) 15-10-30-15 units (the 15 units is if you eat a bedtime snack).  take extra novolog 5 units as needed for any sugar over 300.  Please schedule a follow-up appointment in 3 months. increase nph to 45 units units at bedtime only. check your blood sugar 4 times a day--before the 3 meals, and at bedtime.  also check if you have symptoms of your blood sugar being too high or too low.  please keep a record of the readings and bring it to your next appointment here.  please call us sooner if you are having low blood sugar episode.   blood tests are being requested for you today.  please call [phone] to hear your test results.  You will be prompted to enter the 9-digit "MRN" number that appears at the top left of this page, followed by #.  Then you will hear the message.

## 2011-12-21 ENCOUNTER — Ambulatory Visit: Payer: Medicare Other | Admitting: Vascular Surgery

## 2011-12-21 ENCOUNTER — Ambulatory Visit: Payer: Medicare Other | Admitting: Internal Medicine

## 2011-12-28 ENCOUNTER — Encounter: Payer: Self-pay | Admitting: Internal Medicine

## 2011-12-28 ENCOUNTER — Ambulatory Visit (INDEPENDENT_AMBULATORY_CARE_PROVIDER_SITE_OTHER): Payer: Medicare Other | Admitting: Internal Medicine

## 2011-12-28 VITALS — BP 88/52 | HR 94 | Temp 97.0°F | Wt 198.4 lb

## 2011-12-28 DIAGNOSIS — R5381 Other malaise: Secondary | ICD-10-CM

## 2011-12-28 DIAGNOSIS — I959 Hypotension, unspecified: Secondary | ICD-10-CM

## 2011-12-28 DIAGNOSIS — G40909 Epilepsy, unspecified, not intractable, without status epilepticus: Secondary | ICD-10-CM

## 2011-12-28 DIAGNOSIS — R531 Weakness: Secondary | ICD-10-CM

## 2011-12-28 DIAGNOSIS — N186 End stage renal disease: Secondary | ICD-10-CM

## 2011-12-28 NOTE — Assessment & Plan Note (Signed)
HD MWF - per renal Wife to notify them as well as us if continued problems with hypotension causing "weakness" 

## 2011-12-28 NOTE — Progress Notes (Signed)
Subjective:    Patient ID: [name], male    DOB: 04/27/1933, 78 y.o.   MRN: [id number]  HPI  Here for follow up -   CVA with L HP and dysphagia 07/2011 -  Deficits improved - working with HHPT, s/p SNF rehab for same - home with family since 09/08/11  Seizures - dx fall 2012 after neuro eval for "AMS" and "unresponsive spells" On Keppra and imporved symptoms th fewer recurrences No incontinence, no seziure motor activity during any spells - no bowel changes, no abdominal pain  Last eval by nsurg 07/19/11: head ct ok (no recurrent NPH change since VP shunt 08/2009) no MRI due to PPM Still walking "bent over and not picking feet up" - prior ?pk dz before tx for NPH 08/2009 -   Feels "weak" today - no chest pain, dizziness or unilateral symptoms   Past Medical History  Diagnosis Date  . SLEEP APNEA, OBSTRUCTIVE     CPAP qhs  . VITAMIN B12 DEFICIENCY   . PARKINSON'S DISEASE   . CORONARY ARTERY DISEASE     s/p  CABG 2003-cath 12/31/09-occluded sap dx  graft, otherwise patent grafts  . Chronic systolic heart failure     LVEF 35-40% by cath 12/31/09 s/p BiV ICD 07/2010  . Gastroparesis   . CORONARY ARTERY BYPASS GRAFT, THREE VESSEL, HX OF 2003  . AUTOMATIC IMPLANTABLE CARDIAC DEFIBRILLATOR SITU 07/2010 BiV ICD  . ANEMIA, MACROCYTIC   . DEPRESSION   . DIABETES MELLITUS, TYPE I   . GERD   . HYPERCHOLESTEROLEMIA   . HYPERTENSION   . RENAL FAILURE, END STAGE     HD MWD a/w anemia of chronic dz, hypotension  . PERIPHERAL NEUROPATHY   . History of colonoscopy   . Renal insufficiency   . Cancer   . Collagen vascular disease   . CVA (cerebral vascular accident) 07/2011    L HP and dysphagia, improved  . Seizures    Review of Systems  Constitutional: Positive for fatigue. Negative for fever and unexpected weight change.  Respiratory: Negative for shortness of breath.   Cardiovascular: Negative for chest pain and leg swelling.  Gastrointestinal: Negative for abdominal pain, diarrhea,  constipation and blood in stool.  Neurological: Negative for dizziness and facial asymmetry.       Objective:   Physical Exam  BP 88/52  Pulse 94  Temp(Src) 97 F (36.1 C) (Oral)  Wt 198 lb 6.4 oz (89.994 kg)  SpO2 97% Wt Readings from Last 3 Encounters:  12/28/11 198 lb 6.4 oz (89.994 kg)  12/12/11 194 lb 3.2 oz (88.089 kg)  11/23/11 195 lb 9.6 oz (88.724 kg)   Constitutional:  he appears well-developed and well-nourished. No distress. Wife at side Neck: Normal range of motion. Neck supple. No JVD present. No thyromegaly present.  Cardiovascular: Normal rate, regular rhythm and normal heart sounds.  No murmur heard. No BLE edema Pulmonary/Chest: Effort normal and breath sounds normal. No respiratory distress. no wheezes.  Neuro: AAOx3, CN2-12 symmetrically intact; MAE well - gait slow with use of cane, no detectable L HP or dysarthria     Lab Results  Component Value Date   WBC 6.3 08/22/2011   HGB 9.8* 08/22/2011   HCT 30.0* 08/22/2011   PLT 141* 08/22/2011   CHOL 131 08/09/2011   TRIG 193* 08/09/2011   HDL 30* 08/09/2011   LDLDIRECT 68.2 07/26/2010   ALT 21 09/26/2011   AST 18 09/26/2011   NA 136 08/21/2011   K  4.0 08/21/2011   CL 97 08/21/2011   CREATININE 6.32* 08/21/2011   BUN 87* 08/21/2011   CO2 21 08/21/2011   TSH 1.296 08/15/2010   INR 1.14 08/09/2011   HGBA1C 6.9* 12/12/2011     Assessment & Plan:  Weakness today - likely related to hypotension - all meds already taken today - advised to drink extra fluid today and take orthostatic precautions, do not go unsupervised - to talk with HD team about fluid status and call if SBP<100 in AM to hold Imdur and/or toprol  CVA 07/2011 - L HP and dysphagia improved - cont med mgmt, HHPT and OP neuro f/u  AMS > seizure events - now on Keppra - event 07/2011 clinic associated with hypoglycemia (50s) but other events since without hypoglycemi. unable to have MRI due to PPM - ongoing OP neuro tx, no driving until further eval complete  and no "spells" x 6 mo - Also note prior ?dx of Pk pre 2009 - then changed dx to NPH and underwent VP shunt 08/2009 for same with seeming temporary improvement  Chronic nausea - occurs most AM after shower -  Suspect vasovagal symptoms without syncope - GI exam normal- continue taking zofran each AM upon rising (before shower) - new erx refill done -  Time spent with pt/family today 25 minutes, greater than 50% time spent counseling patient on hosp for CVA 07/2011, seizures, and medication review. Also completion of podiatry forms

## 2011-12-28 NOTE — Patient Instructions (Signed)
It was good to see you today. We have reviewed your prior records including interval history, labs and tests today Go slow today and do not remain unsupervised. Drink extra fluid and okay for salt intake today as reviewed. If systolic pressure remains less than 100 in the morning, do not take him door and call here for further instructions -- or talk to dialysis center Medications reviewed, no changes at this time. Refill on medication(s) as discussed today. Will complete podiatry form as requested today  Please schedule followup in 4-6 months, call sooner if problems.

## 2012-01-06 ENCOUNTER — Other Ambulatory Visit: Payer: Self-pay | Admitting: Internal Medicine

## 2012-01-08 ENCOUNTER — Other Ambulatory Visit: Payer: Self-pay

## 2012-01-08 DIAGNOSIS — I5022 Chronic systolic (congestive) heart failure: Secondary | ICD-10-CM

## 2012-01-08 MED ORDER — METOPROLOL SUCCINATE ER 50 MG PO TB24: 50.0000 mg | ORAL_TABLET | Freq: Every day | ORAL | Status: DC

## 2012-01-11 ENCOUNTER — Ambulatory Visit: Payer: Medicare Other | Admitting: Neurology

## 2012-01-15 ENCOUNTER — Telehealth: Payer: Self-pay | Admitting: Internal Medicine

## 2012-01-15 DIAGNOSIS — I5022 Chronic systolic (congestive) heart failure: Secondary | ICD-10-CM

## 2012-01-15 MED ORDER — METOPROLOL SUCCINATE ER 50 MG PO TB24: ORAL_TABLET | ORAL | Status: AC

## 2012-01-15 NOTE — Telephone Encounter (Signed)
Mr Thurman's daughter is calling because at his last visit, Dr Ladona Ridgel increased his Metoprolol to 75mg  daily.  He has been using up his current supply and is now out.  She is concerned because the refill states 50mg  daily.  I checked the chart, assured her that he was supposed to increase to 75mg  daily and changed the dose in the computer so that they will receive the correct amount and have the correct information on the bottle.

## 2012-01-15 NOTE — Telephone Encounter (Signed)
New Problem   Please return call to patient daughter Eunice Blase [phone] regarding patient medication issues.

## 2012-01-16 ENCOUNTER — Ambulatory Visit: Payer: Medicare Other | Admitting: Neurology

## 2012-01-25 ENCOUNTER — Ambulatory Visit (INDEPENDENT_AMBULATORY_CARE_PROVIDER_SITE_OTHER): Payer: Medicare Other | Admitting: *Deleted

## 2012-01-25 ENCOUNTER — Encounter: Payer: Self-pay | Admitting: Internal Medicine

## 2012-01-25 DIAGNOSIS — Z95810 Presence of automatic (implantable) cardiac defibrillator: Secondary | ICD-10-CM

## 2012-01-25 DIAGNOSIS — I428 Other cardiomyopathies: Secondary | ICD-10-CM

## 2012-01-25 DIAGNOSIS — I5022 Chronic systolic (congestive) heart failure: Secondary | ICD-10-CM

## 2012-01-27 LAB — REMOTE ICD DEVICE
AL IMPEDENCE ICD: 437 Ohm
AL THRESHOLD: 1.5 V
BATTERY VOLTAGE: 3.1347 V
LV LEAD IMPEDENCE ICD: 646 Ohm
RV LEAD AMPLITUDE: 20.5 mv
TOT-0002: 0
TOT-0006: 20110823000000
TZAT-0001FASTVT: 1
TZAT-0002ATACH: NEGATIVE
TZAT-0002ATACH: NEGATIVE
TZAT-0002FASTVT: NEGATIVE
TZAT-0004SLOWVT: 8
TZAT-0005SLOWVT: 88 pct
TZAT-0005SLOWVT: 91 pct
TZAT-0011SLOWVT: 10 ms
TZAT-0011SLOWVT: 10 ms
TZAT-0012ATACH: 150 ms
TZAT-0012SLOWVT: 200 ms
TZAT-0012SLOWVT: 200 ms
TZAT-0018FASTVT: NEGATIVE
TZAT-0019ATACH: 6 V
TZAT-0019ATACH: 6 V
TZAT-0019ATACH: 6 V
TZAT-0019SLOWVT: 8 V
TZAT-0020ATACH: 1.5 ms
TZAT-0020ATACH: 1.5 ms
TZON-0003ATACH: 350 ms
TZON-0003SLOWVT: 340 ms
TZST-0001FASTVT: 3
TZST-0001FASTVT: 5
TZST-0001SLOWVT: 4
TZST-0002FASTVT: NEGATIVE
TZST-0002FASTVT: NEGATIVE
TZST-0003SLOWVT: 20 J
TZST-0003SLOWVT: 35 J
VENTRICULAR PACING ICD: 99.99 pct

## 2012-01-29 ENCOUNTER — Other Ambulatory Visit: Payer: Self-pay | Admitting: Internal Medicine

## 2012-01-31 NOTE — Progress Notes (Signed)
Remote defib check w/icm  

## 2012-02-05 ENCOUNTER — Encounter: Payer: Self-pay | Admitting: *Deleted

## 2012-02-07 ENCOUNTER — Other Ambulatory Visit (HOSPITAL_COMMUNITY): Payer: Self-pay | Admitting: Nephrology

## 2012-02-07 DIAGNOSIS — N186 End stage renal disease: Secondary | ICD-10-CM

## 2012-02-08 ENCOUNTER — Ambulatory Visit: Payer: Medicare Other | Admitting: Internal Medicine

## 2012-02-12 ENCOUNTER — Other Ambulatory Visit: Payer: Self-pay | Admitting: Cardiovascular Disease

## 2012-02-13 ENCOUNTER — Encounter (HOSPITAL_COMMUNITY): Payer: Self-pay

## 2012-02-13 ENCOUNTER — Ambulatory Visit (HOSPITAL_COMMUNITY)
Admission: RE | Admit: 2012-02-13 | Discharge: 2012-02-13 | Disposition: A | Discharge status: Home / Self Care | Payer: Medicare Other | Source: Ambulatory Visit | Attending: Nephrology | Admitting: Nephrology

## 2012-02-13 ENCOUNTER — Ambulatory Visit: Payer: Medicare Other | Admitting: Internal Medicine

## 2012-02-13 ENCOUNTER — Other Ambulatory Visit (HOSPITAL_COMMUNITY): Payer: Self-pay | Admitting: Nephrology

## 2012-02-13 DIAGNOSIS — N186 End stage renal disease: Secondary | ICD-10-CM | POA: Insufficient documentation

## 2012-02-13 DIAGNOSIS — Y832 Surgical operation with anastomosis, bypass or graft as the cause of abnormal reaction of the patient, or of later complication, without mention of misadventure at the time of the procedure: Secondary | ICD-10-CM | POA: Insufficient documentation

## 2012-02-13 DIAGNOSIS — E109 Type 1 diabetes mellitus without complications: Secondary | ICD-10-CM | POA: Insufficient documentation

## 2012-02-13 DIAGNOSIS — I120 Hypertensive chronic kidney disease with stage 5 chronic kidney disease or end stage renal disease: Secondary | ICD-10-CM | POA: Insufficient documentation

## 2012-02-13 DIAGNOSIS — G20A1 Parkinson's disease without dyskinesia, without mention of fluctuations: Secondary | ICD-10-CM | POA: Insufficient documentation

## 2012-02-13 DIAGNOSIS — T82898A Other specified complication of vascular prosthetic devices, implants and grafts, initial encounter: Secondary | ICD-10-CM | POA: Insufficient documentation

## 2012-02-13 DIAGNOSIS — G20 Parkinson's disease: Secondary | ICD-10-CM | POA: Insufficient documentation

## 2012-02-13 DIAGNOSIS — I69959 Hemiplegia and hemiparesis following unspecified cerebrovascular disease affecting unspecified side: Secondary | ICD-10-CM | POA: Insufficient documentation

## 2012-02-13 MED ORDER — IOHEXOL 300 MG/ML  SOLN
100.0000 mL | Freq: Once | INTRAMUSCULAR | Status: AC | PRN
  Administered 2012-02-13: 60 mL via INTRAVENOUS

## 2012-02-13 NOTE — Procedures (Signed)
Left arm fistulogram demonstrated diffuse narrowing of vein beyond anastomosis.  This was successfully treated with 5 mm x 20 mm balloon.   No immediate complication.

## 2012-02-13 NOTE — H&P (Signed)
[name] is an 78 y.o. male.   Chief Complaint: left arm fistula stenosis HPI: ESRD: fistula stenosis by fistulogram; scheduled now for angioplasty/stent placement  Past Medical History  Diagnosis Date  . SLEEP APNEA, OBSTRUCTIVE     CPAP qhs  . VITAMIN B12 DEFICIENCY   . PARKINSON'S DISEASE   . CORONARY ARTERY DISEASE     s/p  CABG 2003-cath 12/31/09-occluded sap dx  graft, otherwise patent grafts  . Chronic systolic heart failure     LVEF 35-40% by cath 12/31/09 s/p BiV ICD 07/2010  . Gastroparesis   . CORONARY ARTERY BYPASS GRAFT, THREE VESSEL, HX OF 2003  . AUTOMATIC IMPLANTABLE CARDIAC DEFIBRILLATOR SITU 07/2010 BiV ICD  . ANEMIA, MACROCYTIC   . DEPRESSION   . DIABETES MELLITUS, TYPE I   . GERD   . HYPERCHOLESTEROLEMIA   . HYPERTENSION   . RENAL FAILURE, END STAGE     HD MWD a/w anemia of chronic dz, hypotension  . PERIPHERAL NEUROPATHY   . History of colonoscopy   . Renal insufficiency   . Cancer   . Collagen vascular disease   . CVA (cerebral vascular accident) 07/2011    L HP and dysphagia, improved  . Seizures     Past Surgical History  Procedure Date  . Coronary artery bypass graft   . Cataract extraction, bilateral   . Pacemaker placement     permanent  . Dialysis fistula creation   . Shunt replacement   . Doppler echocardiography 2005, 2008, 2010, &2012    Family History  Problem Relation Age of Onset  . Stroke Mother   . Diabetes Father   . Lung cancer Father   . Diabetes Brother   . Stomach cancer Paternal Aunt   . Colon cancer Neg Hx    Social History:  reports that he has never smoked. He has never used smokeless tobacco. He reports that he does not drink alcohol or use illicit drugs.  Allergies:  Allergies  Allergen Reactions  . Nifedipine   . Tape     Medications Prior to Admission  Medication Sig Dispense Refill  . acetaminophen (TYLENOL) 325 MG tablet as needed.        Marland Kitchen aspirin 81 MG tablet Take 81 mg by mouth daily.        Marland Kitchen  atorvastatin (LIPITOR) 20 MG tablet Take 20 mg by mouth daily.        . betamethasone dipropionate 0.05 % lotion Apply topically 2 (two) times daily as needed.        . calcium acetate (PHOSLO) 667 MG capsule 3 (three) times daily. 1 capsule at dinner      . camphor-menthol (SARNA) lotion Apply topically. As directed       . citalopram (CELEXA) 20 MG tablet Take 20 mg by mouth daily.        Marland Kitchen docusate sodium (COLACE) 100 MG capsule as needed.       Marland Kitchen glucagon (GLUCAGON EMERGENCY) 1 MG injection Use as directed       . hydrOXYzine (ATARAX/VISTARIL) 25 MG tablet TAKE AS DIRECTED THE MORNING OF DIALYSIS  30 tablet  1  . insulin lispro (HUMALOG) 100 UNIT/ML injection Inject four times daily: 15-10-30-15 units  75 mL  3  . insulin NPH (HUMULIN N PEN) 100 UNIT/ML injection Inject 45 Units into the skin at bedtime.  45 mL  3  . Insulin Pen Needle (B-D ULTRAFINE III SHORT PEN) 31G X 8 MM MISC Use as  directed five times daily  600 each  3  . isosorbide mononitrate (IMDUR) 60 MG 24 hr tablet Take 60 mg by mouth daily.        Marland Kitchen levETIRAcetam (KEPPRA) 500 MG tablet take 1 in the a.m, 1 at night and 1 after every dialysis session  75 tablet  6  . metoprolol succinate (TOPROL-XL) 50 MG 24 hr tablet Take one and one-half tabs by mouth once daily  45 tablet  11  . multivitamin (RENA-VIT) TABS tablet Take 1 tablet by mouth daily.        Marland Kitchen NEXIUM 40 MG capsule TAKE ONE CAPSULE BY MOUTH TWICE DAILY  60 capsule  6  . nitroGLYCERIN (NITROSTAT) 0.4 MG SL tablet Place 0.4 mg under the tongue as needed.        . NON FORMULARY CPAP  For obstructive sleep apnea       . NON FORMULARY Dialysis  On Mon Wed Fri epogen 3800units,  infed 50mg ,  zemplar 2 mcg      . Nutritional Supplements (NEPRO) LIQD Take by mouth daily.        . ondansetron (ZOFRAN) 4 MG tablet TAKE 1 TABLET BY MOUTH EVERY 8 HOURS AS NEEDED FOR NAUSEA  30 tablet  1  . prasugrel (EFFIENT) 10 MG TABS Take 1 tablet (10 mg total) by mouth daily.  30 tablet     . promethazine (PHENERGAN) 12.5 MG tablet as needed.        Marland Kitchen RANEXA 500 MG 12 hr tablet TAKE 1 TABLET BY MOUTH TWICE DAILY  60 tablet  6  . traMADol (ULTRAM) 50 MG tablet Take 1 by mouth as needed for back pain      . zolpidem (AMBIEN) 5 MG tablet Take 5 mg by mouth at bedtime as needed.         No current facility-administered medications on file as of 02/13/2012.    No results found for this or any previous visit (from the past 48 hour(s)). No results found.  Review of Systems  Constitutional: Negative for fever.  Respiratory: Negative for cough.   Cardiovascular: Negative for chest pain.  Gastrointestinal: Negative for nausea, vomiting and abdominal pain.    There were no vitals taken for this visit. Physical Exam  Constitutional: He is oriented to person, place, and time. He appears well-developed and well-nourished.  Eyes: EOM are normal.  Neck: Normal range of motion.  Cardiovascular: Normal rate, regular rhythm and normal heart sounds.   No murmur heard. Respiratory: Effort normal and breath sounds normal. He has no wheezes.  GI: Soft. Bowel sounds are normal. There is no tenderness.  Musculoskeletal: Normal range of motion.  Neurological: He is alert and oriented to person, place, and time.  Skin: Skin is warm and dry.     Assessment/Plan Left arm fistula stenosis by fistulogram Scheduled now for PTA/stent Pt aware of procedure benefits and risks and agreeable to proceed Consent signed.  Tashianna Broome A 02/13/2012, 8:01 AM

## 2012-02-14 ENCOUNTER — Telehealth (HOSPITAL_COMMUNITY): Payer: Self-pay

## 2012-02-29 ENCOUNTER — Ambulatory Visit (INDEPENDENT_AMBULATORY_CARE_PROVIDER_SITE_OTHER): Payer: Medicare Other | Admitting: Internal Medicine

## 2012-02-29 ENCOUNTER — Encounter: Payer: Self-pay | Admitting: Neurology

## 2012-02-29 ENCOUNTER — Encounter: Payer: Self-pay | Admitting: Internal Medicine

## 2012-02-29 ENCOUNTER — Ambulatory Visit (INDEPENDENT_AMBULATORY_CARE_PROVIDER_SITE_OTHER): Payer: Medicare Other | Admitting: Neurology

## 2012-02-29 VITALS — BP 108/62 | HR 112 | Wt 200.0 lb

## 2012-02-29 VITALS — BP 112/58 | HR 106 | Temp 97.9°F | Wt 200.2 lb

## 2012-02-29 DIAGNOSIS — R531 Weakness: Secondary | ICD-10-CM

## 2012-02-29 DIAGNOSIS — R569 Unspecified convulsions: Secondary | ICD-10-CM

## 2012-02-29 DIAGNOSIS — R110 Nausea: Secondary | ICD-10-CM

## 2012-02-29 DIAGNOSIS — R5381 Other malaise: Secondary | ICD-10-CM

## 2012-02-29 DIAGNOSIS — G40909 Epilepsy, unspecified, not intractable, without status epilepticus: Secondary | ICD-10-CM

## 2012-02-29 MED ORDER — ONDANSETRON HCL 4 MG PO TABS: 4.0000 mg | ORAL_TABLET | Freq: Two times a day (BID) | ORAL | Status: DC | PRN

## 2012-02-29 MED ORDER — HYDROXYZINE HCL 25 MG PO TABS: ORAL_TABLET | ORAL | Status: DC

## 2012-02-29 NOTE — Assessment & Plan Note (Signed)
AMS > seizure events - now on Keppra -  event 07/2011 clinic associated with hypoglycemia (50s) but other events since without hypoglycemia.  unable to have MRI due to PPM - ongoing OP neuro tx, no driving  Also note prior ?dx of Pk pre 2009 - then changed dx to NPH and underwent VP shunt 08/2009 for same with seeming temporary improvement

## 2012-02-29 NOTE — Patient Instructions (Signed)
It was good to see you today. We have reviewed your prior records including interval history, labs and tests today Oxygenation at rest and with exertion are normal - 96-97% on room air - form completed for renal providers as requested regarding same Medications reviewed, no changes at this time. Okay to use Zofran twice daily and hydroxyzine as needed for itch plus dialysis Refill on medication(s) as discussed today. Remember to take small bites and to chew food completely before swallow; use chin tuck during swallow while eating anything Please keep scheduled followup as planned for continued review, call sooner if problems.

## 2012-02-29 NOTE — Progress Notes (Signed)
Dear Dr. Felicity Coyer,   I saw [name] back in [facility] for his problem with transient spells of confusion. As you may recall, he is a 78 y.o. year old male with a history of ICD placement, renal failure with dialysis dependence, normal pressure hydrocephalus s/p VP shunt who has been having repeated spells of confusion lasting from 20 minutes to a day and a half since July. He did have a very prolonged spell of confusion in August which was associated with a fever. No obvious etiology was found at that time.   I was suspicious these spells were possible seizures. I repeated an EEG which revealed a mild to moderate encephalopathy and diffuse slowing over the right hemisphere, likely related to his shunt placement. In addition he had waxing and waning beta activities at the vertex of uncertain significance.  He had been discharged on Keppra 500 bid. I kept him on that dose. Since he was last seen he has had one prolonged spell of confusion after a lower pressure episode in the renal unit. This lasted 10 days. In addition, he has been having spells where he looks like he is acting out his dreams during naps(appears to be working with his hands). These occur daily. He also has rarer spells where he appears to be awake picking at things in the air or on his lap(these occur about 1 per week).  At his last visit, I added Keppra 500mg  to be taken after his dialysis as Keppra is dialyzed off.  His family says that he is doing better.  He has not had any prolonged confusional spells, but has had two spells of confusion, both associated with dialysis, and 1 associated with low BP during dialysis, although his BP after another one is not known.  He has not had the "picking spells" or acting out his dreams during his naps.   Medical history, social history, and family history were reviewed and have not changed since the last clinic visit.  Current Outpatient Prescriptions on File Prior to Visit    Medication Sig Dispense Refill  . acetaminophen (TYLENOL) 325 MG tablet as needed.        Marland Kitchen aspirin 81 MG tablet Take 81 mg by mouth daily.        Marland Kitchen atorvastatin (LIPITOR) 20 MG tablet Take 20 mg by mouth daily.        . betamethasone dipropionate 0.05 % lotion Apply topically 2 (two) times daily as needed.        . calcium acetate (PHOSLO) 667 MG capsule 3 (three) times daily. 1 capsule at dinner      . camphor-menthol (SARNA) lotion Apply topically. As directed       . citalopram (CELEXA) 20 MG tablet Take 20 mg by mouth daily.        Marland Kitchen docusate sodium (COLACE) 100 MG capsule as needed.       Marland Kitchen glucagon (GLUCAGON EMERGENCY) 1 MG injection Use as directed       . insulin lispro (HUMALOG) 100 UNIT/ML injection Inject four times daily: 15-10-30-15 units  75 mL  3  . insulin NPH (HUMULIN N PEN) 100 UNIT/ML injection Inject 45 Units into the skin at bedtime.  45 mL  3  . Insulin Pen Needle (B-D ULTRAFINE III SHORT PEN) 31G X 8 MM MISC Use as directed five times daily  600 each  3  . isosorbide mononitrate (IMDUR) 60 MG 24 hr tablet Take 60 mg by mouth daily.        Marland Kitchen  levETIRAcetam (KEPPRA) 500 MG tablet take 1 in the a.m, 1 at night and 1 after every dialysis session  75 tablet  6  . metoprolol succinate (TOPROL-XL) 50 MG 24 hr tablet Take one and one-half tabs by mouth once daily  45 tablet  11  . multivitamin (RENA-VIT) TABS tablet Take 1 tablet by mouth daily.        Marland Kitchen NEXIUM 40 MG capsule TAKE ONE CAPSULE BY MOUTH TWICE DAILY  60 capsule  6  . nitroGLYCERIN (NITROSTAT) 0.4 MG SL tablet Place 0.4 mg under the tongue as needed.        . NON FORMULARY CPAP  For obstructive sleep apnea       . NON FORMULARY Dialysis  On Mon Wed Fri epogen 3800units,  infed 50mg ,  zemplar 2 mcg      . Nutritional Supplements (NEPRO) LIQD Take by mouth daily.        . prasugrel (EFFIENT) 10 MG TABS Take 1 tablet (10 mg total) by mouth daily.  30 tablet    . promethazine (PHENERGAN) 12.5 MG tablet as needed.         . traMADol (ULTRAM) 50 MG tablet Take 1 by mouth as needed for back pain      . zolpidem (AMBIEN) 5 MG tablet Take 5 mg by mouth at bedtime as needed.        Marland Kitchen DISCONTD: hydrOXYzine (ATARAX/VISTARIL) 25 MG tablet TAKE AS DIRECTED THE MORNING OF DIALYSIS  30 tablet  1  . DISCONTD: ondansetron (ZOFRAN) 4 MG tablet TAKE 1 TABLET BY MOUTH EVERY 8 HOURS AS NEEDED FOR NAUSEA  30 tablet  1  . DISCONTD: RANEXA 500 MG 12 hr tablet TAKE 1 TABLET BY MOUTH TWICE DAILY  60 tablet  6    Allergies  Allergen Reactions  . Nifedipine   . Tape     ROS:  13 systems were reviewed and are notable for falls, he has a history gait instability attributed to NPH.  He also has significant problems with memory.  All other review of systems are unremarkable.  Exam: . Filed Vitals:   02/29/12 1117  BP: 108/62  Pulse: 112  Weight: 200 lb (90.719 kg)    In general, well appearing man with blunted affect.   Cranial Nerves: Pupils are equally round and reactive to light. Extraocular movements are intact without nystagmus.  Tongue protrusion, uvula, palate midline.  Shoulder shrug intact  Motor:  Normal bulk and tone, no drift and 5/5 muscle strength bilaterally.  Reflexes:  Symmetric    Gait:  Gait, slow wide based.   Impression/Recommendations:  1.  Prolonged spells of confusion - These seem to be reduced.  Certainly spells of "picking" and spells of "acting out dreams" are less, although I doubt the latter is a seizures and the former I am uncertain about.  He still has the spells of confusion associated with dialysis, but this could be dysequilibrium syndrome from his dialysis.  In any event I don't think he is currently have seizures(if he ever was), so I will keep his Keppra the same.  If he begins to have spontaneous spells of confusion then we will consider increasing the Keppra.    We will see the patient back in 4 months.  Lupita Raider Modesto Charon, MD [facility], [place]

## 2012-02-29 NOTE — Progress Notes (Signed)
Subjective:    Patient ID: [name], male    DOB: 11-09-1933, 78 y.o.   MRN: [id number]  HPI  Here for follow up -  Note from renal requesting O2 sat at rest and w/ ambulation Also taking zofran bid for nausea with good control of symptoms - ?increase monthly supply ?use hydroxyzine for itch beyond dialysis   Also reviewed chronic med issues: CVA with L HP and dysphagia 07/2011 -  Deficits improved - working with HHPT, s/p SNF rehab for same - home with family since 09/08/11  Seizures - dx fall 2012 after neuro eval for "AMS" and "unresponsive spells" On Keppra and imporved symptoms th fewer recurrences No incontinence, no seziure motor activity during any spells - no bowel changes, no abdominal pain  Last eval by nsurg 07/19/11: head ct ok (no recurrent NPH change since VP shunt 08/2009) no MRI due to PPM Still walking "bent over and not picking feet up" - prior ?pk dz before tx for NPH 08/2009 -    Past Medical History  Diagnosis Date  . SLEEP APNEA, OBSTRUCTIVE     CPAP qhs  . VITAMIN B12 DEFICIENCY   . PARKINSON'S DISEASE   . CORONARY ARTERY DISEASE     s/p  CABG 2003-cath 12/31/09-occluded sap dx  graft, otherwise patent grafts  . Chronic systolic heart failure     LVEF 35-40% by cath 12/31/09 s/p BiV ICD 07/2010  . Gastroparesis   . CORONARY ARTERY BYPASS GRAFT, THREE VESSEL, HX OF 2003  . AUTOMATIC IMPLANTABLE CARDIAC DEFIBRILLATOR SITU 07/2010 BiV ICD  . ANEMIA, MACROCYTIC   . DEPRESSION   . DIABETES MELLITUS, TYPE I   . GERD   . HYPERCHOLESTEROLEMIA   . HYPERTENSION   . RENAL FAILURE, END STAGE     HD MWD a/w anemia of chronic dz, hypotension  . PERIPHERAL NEUROPATHY   . History of colonoscopy   . Renal insufficiency   . Cancer   . Collagen vascular disease   . CVA (cerebral vascular accident) 07/2011    L HP and dysphagia, improved  . Seizures    Review of Systems  Constitutional: Positive for fatigue. Negative for fever and unexpected weight change.    Respiratory: Negative for shortness of breath.   Cardiovascular: Negative for chest pain and leg swelling.  Gastrointestinal: Negative for abdominal pain, diarrhea, constipation and blood in stool.  Neurological: Negative for dizziness and facial asymmetry.       Objective:   Physical Exam  BP 112/58  Pulse 106  Temp(Src) 97.9 F (36.6 C) (Oral)  Wt 200 lb 3.2 oz (90.81 kg)  SpO2 97% Wt Readings from Last 3 Encounters:  02/29/12 200 lb 3.2 oz (90.81 kg)  12/28/11 198 lb 6.4 oz (89.994 kg)  12/12/11 194 lb 3.2 oz (88.089 kg)   Constitutional:  he appears well-developed and well-nourished. No distress. Wife and dtr at side Neck: Normal range of motion. Neck supple. No JVD present. No thyromegaly present.  Cardiovascular: Normal rate, regular rhythm and normal heart sounds.  No murmur heard. No BLE edema Pulmonary/Chest: Effort normal and breath sounds normal. No respiratory distress. no wheezes.  Neuro: AAOx3, CN2-12 symmetrically intact; MAE well - gait slow with use of cane, no detectable L HP or dysarthria       Lab Results  Component Value Date   WBC 6.3 08/22/2011   HGB 9.8* 08/22/2011   HCT 30.0* 08/22/2011   PLT 141* 08/22/2011   CHOL 131 08/09/2011  TRIG 193* 08/09/2011   HDL 30* 08/09/2011   LDLDIRECT 68.2 07/26/2010   ALT 21 09/26/2011   AST 18 09/26/2011   NA 136 08/21/2011   K 4.0 08/21/2011   CL 97 08/21/2011   CREATININE 6.32* 08/21/2011   BUN 87* 08/21/2011   CO2 21 08/21/2011   TSH 1.296 08/15/2010   INR 1.14 08/09/2011   HGBA1C 6.9* 12/12/2011     Assessment & Plan:  Weakness, chronic and multifactorial - likely related to hypotension, dialysis, meds - Continue orthostatic precautions, not to go unsupervised -  Continue discussion with HD team about fluid status - O2 ok at rest and exertion trials today call if SBP<100 in AM to hold Imdur and/or toprol  CVA 07/2011 - L HP and dysphagia improved, occasional "choke" with nausea events - see below cont med mgmt  and OP neuro f/u  Chronic nausea, hx gastroparesis (DM) - occurs most AM after shower, also PM meal -   Element of vasovagal symptoms without syncope -  GI exam normal- improved with Zofran BID continue taking zofran each AM upon rising (before shower) and PM as needed-  new erx supply/refill done -  Time spent with pt/family today 25 minutes, greater than 50% time spent counseling patient + family on nausea, weakness and medication review. Also completion of renal med request forms

## 2012-03-08 ENCOUNTER — Other Ambulatory Visit: Payer: Self-pay

## 2012-03-08 MED ORDER — DIPHENOXYLATE-ATROPINE 2.5-0.025 MG PO TABS: 1.0000 | ORAL_TABLET | Freq: Four times a day (QID) | ORAL | Status: AC | PRN

## 2012-03-08 NOTE — Telephone Encounter (Signed)
May call in or fax lomotil (rx above) - need OV here or GI if unimproved in next few days - call sooner if worse (ie,  abdominal pain, fever, blood, etc)

## 2012-03-08 NOTE — Telephone Encounter (Signed)
Pt advised via home VM 

## 2012-03-08 NOTE — Telephone Encounter (Signed)
Pt's spouse called stating pt has been experiencing diarrhea x 1 day. Spouse is requesting Rx to treat, pt has tried OTC medication without much relief and he has an appt for dialysis this afternoon, please advise.

## 2012-03-12 ENCOUNTER — Ambulatory Visit: Payer: Medicare Other | Admitting: Endocrinology

## 2012-03-13 ENCOUNTER — Other Ambulatory Visit: Payer: Self-pay

## 2012-03-13 MED ORDER — PRASUGREL HCL 10 MG PO TABS: 10.0000 mg | ORAL_TABLET | Freq: Every day | ORAL | Status: DC

## 2012-03-14 ENCOUNTER — Ambulatory Visit: Payer: Self-pay | Admitting: Vascular Surgery

## 2012-03-14 ENCOUNTER — Ambulatory Visit: Payer: Medicare Other | Admitting: Endocrinology

## 2012-03-20 ENCOUNTER — Other Ambulatory Visit: Payer: Self-pay | Admitting: Gastroenterology

## 2012-03-20 NOTE — Telephone Encounter (Signed)
Prior auth needed to be done , has been done and approved until 03/20/2013 pharmacy faxed approval and left message for pts daughter that it has been done.

## 2012-03-29 ENCOUNTER — Ambulatory Visit: Payer: Self-pay | Admitting: Vascular Surgery

## 2012-03-29 LAB — BASIC METABOLIC PANEL
Anion Gap: 11 (ref 7–16)
Chloride: 100 mmol/L (ref 98–107)
Co2: 27 mmol/L (ref 21–32)
Glucose: 139 mg/dL — ABNORMAL HIGH (ref 65–99)
Potassium: 4.2 mmol/L (ref 3.5–5.1)

## 2012-04-11 ENCOUNTER — Encounter: Payer: Self-pay | Admitting: Endocrinology

## 2012-04-11 ENCOUNTER — Ambulatory Visit (INDEPENDENT_AMBULATORY_CARE_PROVIDER_SITE_OTHER): Payer: Medicare Other | Admitting: Endocrinology

## 2012-04-11 ENCOUNTER — Other Ambulatory Visit (INDEPENDENT_AMBULATORY_CARE_PROVIDER_SITE_OTHER): Payer: Medicare Other

## 2012-04-11 VITALS — BP 122/72 | HR 107 | Temp 98.1°F | Ht 73.0 in | Wt 198.0 lb

## 2012-04-11 DIAGNOSIS — N058 Unspecified nephritic syndrome with other morphologic changes: Secondary | ICD-10-CM

## 2012-04-11 DIAGNOSIS — E1029 Type 1 diabetes mellitus with other diabetic kidney complication: Secondary | ICD-10-CM

## 2012-04-11 NOTE — Patient Instructions (Addendum)
Please change humalog to (just before each meal) 10-03-34-15 units (the 15 units is if you eat a bedtime snack).  take extra novolog 5 units as needed for any sugar over 300.  blood tests are being requested for you today.  You will receive a letter with results. Please schedule a follow-up appointment in 4 months. increase nph to 45 units units at bedtime only. check your blood sugar 4 times a day--before the 3 meals, and at bedtime.  also check if you have symptoms of your blood sugar being too high or too low.  please keep a record of the readings and bring it to your next appointment here.  please call us sooner if you are having low blood sugar episode.   (see letter)

## 2012-04-11 NOTE — Progress Notes (Signed)
Subjective:    Patient ID: [name], male    DOB: 06-19-1933, 78 y.o.   MRN: [id number]  HPI Pt returns for f/u of IDDM (dx'ed 1977, complicated by ESRD, retinopathy, gastroparesis, CVA, CAD, and peripheral sensory neuropathy).  he brings a record of his cbg's which i have reviewed today.  it varies from 65-250, but most are in the low-100's  It is lowest at lunch, and highest at hs, and in am.   Past Medical History  Diagnosis Date  . SLEEP APNEA, OBSTRUCTIVE     CPAP qhs  . VITAMIN B12 DEFICIENCY   . PARKINSON'S DISEASE   . CORONARY ARTERY DISEASE     s/p  CABG 2003-cath 12/31/09-occluded sap dx  graft, otherwise patent grafts  . Chronic systolic heart failure     LVEF 35-40% by cath 12/31/09 s/p BiV ICD 07/2010  . Gastroparesis   . CORONARY ARTERY BYPASS GRAFT, THREE VESSEL, HX OF 2003  . AUTOMATIC IMPLANTABLE CARDIAC DEFIBRILLATOR SITU 07/2010 BiV ICD  . ANEMIA, MACROCYTIC   . DEPRESSION   . DIABETES MELLITUS, TYPE I   . GERD   . HYPERCHOLESTEROLEMIA   . HYPERTENSION   . RENAL FAILURE, END STAGE     HD MWD a/w anemia of chronic dz, hypotension  . PERIPHERAL NEUROPATHY   . History of colonoscopy   . Renal insufficiency   . Cancer   . Collagen vascular disease   . CVA (cerebral vascular accident) 07/2011    L HP and dysphagia, improved  . Seizures     Past Surgical History  Procedure Date  . Coronary artery bypass graft   . Cataract extraction, bilateral   . Pacemaker placement     permanent  . Dialysis fistula creation   . Shunt replacement   . Doppler echocardiography 2005, 2008, 2010, &2012    History   Social History  . Marital Status: Married    Spouse Name: N/A    Number of Children: 2  . Years of Education: N/A   Occupational History  . retired    Social History Main Topics  . Smoking status: Never Smoker   . Smokeless tobacco: Never Used   Comment: Married, lives with spouse, supportive dtr and son nearby. Pt is on dialysis  . Alcohol Use: No  .  Drug Use: No  . Sexually Active: Not on file   Other Topics Concern  . Not on file   Social History Narrative   Pt does not get regular exercise.Pt is on dialysis-MWF_since 10/2010Lives with spouse, supportive dtr and son nearby    Current Outpatient Prescriptions on File Prior to Visit  Medication Sig Dispense Refill  . acetaminophen (TYLENOL) 325 MG tablet as needed.        Marland Kitchen aspirin 81 MG tablet Take 81 mg by mouth daily.        Marland Kitchen atorvastatin (LIPITOR) 20 MG tablet Take 20 mg by mouth daily.        . betamethasone dipropionate 0.05 % lotion Apply topically 2 (two) times daily as needed.        . calcium acetate (PHOSLO) 667 MG capsule 3 (three) times daily. 1 capsule at dinner      . camphor-menthol (SARNA) lotion Apply topically. As directed       . citalopram (CELEXA) 20 MG tablet Take 20 mg by mouth daily.        Marland Kitchen docusate sodium (COLACE) 100 MG capsule as needed.       Marland Kitchen  glucagon (GLUCAGON EMERGENCY) 1 MG injection Use as directed       . hydrOXYzine (ATARAX/VISTARIL) 25 MG tablet Take AM of dialysis and every 6 hours as needed for itch symptoms  60 tablet  1  . insulin lispro (HUMALOG) 100 UNIT/ML injection 4x a day (just before each meal) 10-03-34-15 units (the 15 units is if you eat a bedtime snack).      . insulin NPH (HUMULIN N PEN) 100 UNIT/ML injection Inject 45 Units into the skin at bedtime.  45 mL  3  . Insulin Pen Needle (B-D ULTRAFINE III SHORT PEN) 31G X 8 MM MISC Use as directed five times daily  600 each  3  . isosorbide mononitrate (IMDUR) 60 MG 24 hr tablet Take 60 mg by mouth daily.        Marland Kitchen levETIRAcetam (KEPPRA) 500 MG tablet take 1 in the a.m, 1 at night and 1 after every dialysis session  75 tablet  6  . metoprolol succinate (TOPROL-XL) 50 MG 24 hr tablet Take one and one-half tabs by mouth once daily  45 tablet  11  . multivitamin (RENA-VIT) TABS tablet Take 1 tablet by mouth daily.        Marland Kitchen NEXIUM 40 MG capsule TAKE ONE CAPSULE BY MOUTH TWICE DAILY  60  capsule  6  . nitroGLYCERIN (NITROSTAT) 0.4 MG SL tablet Place 0.4 mg under the tongue as needed.        . NON FORMULARY CPAP  For obstructive sleep apnea       . NON FORMULARY Dialysis  On Mon Wed Fri epogen 3800units,  infed 50mg ,  zemplar 2 mcg      . Nutritional Supplements (NEPRO) LIQD Take by mouth daily.        . ondansetron (ZOFRAN) 4 MG tablet Take 1 tablet (4 mg total) by mouth 2 (two) times daily as needed for nausea.  60 tablet  6  . prasugrel (EFFIENT) 10 MG TABS Take 1 tablet (10 mg total) by mouth daily.  30 tablet  8  . promethazine (PHENERGAN) 12.5 MG tablet as needed.        . ranolazine (RANEXA) 500 MG 12 hr tablet       . traMADol (ULTRAM) 50 MG tablet Take 1 by mouth as needed for back pain      . zolpidem (AMBIEN) 5 MG tablet Take 5 mg by mouth at bedtime as needed.          Allergies  Allergen Reactions  . Nifedipine   . Tape     Family History  Problem Relation Age of Onset  . Stroke Mother   . Diabetes Father   . Lung cancer Father   . Diabetes Brother   . Stomach cancer Paternal Aunt   . Colon cancer Neg Hx     BP 122/72  Pulse 107  Temp(Src) 98.1 F (36.7 C) (Oral)  Ht 6\' 1"  (1.854 m)  Wt 198 lb (89.812 kg)  BMI 26.12 kg/m2  SpO2 98%    Review of Systems Denies LOC    Objective:   Physical Exam Pulses: dorsalis pedis intact bilat, but decreased from normal Feet: no deformity.  no ulcer on the feet.  feet are of normal color and temp.  no edema Neuro: sensation is intact to touch on the feet, but decreased from normal   Lab Results  Component Value Date   HGBA1C 6.9* 04/11/2012      Assessment & Plan:  DM,  The pattern of his cbg's indicates he needs some adjustment in his therapy

## 2012-04-12 ENCOUNTER — Telehealth: Payer: Self-pay | Admitting: *Deleted

## 2012-04-12 NOTE — Telephone Encounter (Signed)
Called pt to inform of A1c results, no answer/unable to leave message. (disconnection with phone-letter also mailed to pt)

## 2012-04-15 NOTE — Telephone Encounter (Signed)
Pt's spouse informed of MD's advisement.  

## 2012-04-25 ENCOUNTER — Ambulatory Visit (INDEPENDENT_AMBULATORY_CARE_PROVIDER_SITE_OTHER): Payer: Medicare Other | Admitting: *Deleted

## 2012-04-25 ENCOUNTER — Encounter: Payer: Self-pay | Admitting: Internal Medicine

## 2012-04-25 DIAGNOSIS — I5022 Chronic systolic (congestive) heart failure: Secondary | ICD-10-CM

## 2012-04-25 DIAGNOSIS — I428 Other cardiomyopathies: Secondary | ICD-10-CM

## 2012-04-29 LAB — REMOTE ICD DEVICE
AL AMPLITUDE: 3 mv
AL IMPEDENCE ICD: 494 Ohm
ATRIAL PACING ICD: 0.01 pct
BAMS-0001: 170 {beats}/min
CHARGE TIME: 9.789 s
PACEART VT: 0
RV LEAD AMPLITUDE: 20 mv
RV LEAD IMPEDENCE ICD: 646 Ohm
TOT-0001: 1
TOT-0002: 0
TZAT-0001ATACH: 3
TZAT-0001FASTVT: 1
TZAT-0002ATACH: NEGATIVE
TZAT-0011SLOWVT: 10 ms
TZAT-0011SLOWVT: 10 ms
TZAT-0012ATACH: 150 ms
TZAT-0012ATACH: 150 ms
TZAT-0012FASTVT: 200 ms
TZAT-0012SLOWVT: 200 ms
TZAT-0012SLOWVT: 200 ms
TZAT-0013SLOWVT: 2
TZAT-0013SLOWVT: 2
TZAT-0018SLOWVT: NEGATIVE
TZAT-0019ATACH: 6 V
TZAT-0019SLOWVT: 8 V
TZAT-0019SLOWVT: 8 V
TZAT-0020ATACH: 1.5 ms
TZAT-0020ATACH: 1.5 ms
TZAT-0020FASTVT: 1.5 ms
TZAT-0020SLOWVT: 1.5 ms
TZAT-0020SLOWVT: 1.5 ms
TZON-0003ATACH: 350 ms
TZON-0003SLOWVT: 340 ms
TZON-0004SLOWVT: 16
TZON-0005SLOWVT: 12
TZST-0001ATACH: 4
TZST-0001ATACH: 5
TZST-0001FASTVT: 2
TZST-0001FASTVT: 3
TZST-0001FASTVT: 4
TZST-0001SLOWVT: 4
TZST-0001SLOWVT: 5
TZST-0002ATACH: NEGATIVE
TZST-0002ATACH: NEGATIVE
TZST-0002FASTVT: NEGATIVE
TZST-0002FASTVT: NEGATIVE
TZST-0002FASTVT: NEGATIVE
TZST-0003SLOWVT: 20 J
TZST-0003SLOWVT: 25 J
TZST-0003SLOWVT: 35 J

## 2012-05-03 NOTE — Progress Notes (Signed)
ICD remote with ICM 

## 2012-05-10 ENCOUNTER — Encounter: Payer: Self-pay | Admitting: *Deleted

## 2012-06-11 ENCOUNTER — Ambulatory Visit: Payer: Self-pay | Admitting: Vascular Surgery

## 2012-06-24 ENCOUNTER — Other Ambulatory Visit: Payer: Self-pay | Admitting: Internal Medicine

## 2012-06-25 ENCOUNTER — Encounter: Payer: Self-pay | Admitting: Neurology

## 2012-06-25 ENCOUNTER — Ambulatory Visit (INDEPENDENT_AMBULATORY_CARE_PROVIDER_SITE_OTHER): Payer: Medicare Other | Admitting: Neurology

## 2012-06-25 ENCOUNTER — Ambulatory Visit (INDEPENDENT_AMBULATORY_CARE_PROVIDER_SITE_OTHER)
Admission: RE | Admit: 2012-06-25 | Discharge: 2012-06-25 | Disposition: A | Discharge status: Home / Self Care | Payer: Medicare Other | Source: Ambulatory Visit | Attending: Internal Medicine | Admitting: Internal Medicine

## 2012-06-25 ENCOUNTER — Ambulatory Visit (INDEPENDENT_AMBULATORY_CARE_PROVIDER_SITE_OTHER): Payer: Medicare Other | Admitting: Internal Medicine

## 2012-06-25 ENCOUNTER — Encounter: Payer: Self-pay | Admitting: Internal Medicine

## 2012-06-25 VITALS — BP 126/64 | HR 100 | Wt 199.4 lb

## 2012-06-25 VITALS — BP 122/70 | HR 97 | Temp 97.4°F | Ht 73.0 in | Wt 198.0 lb

## 2012-06-25 DIAGNOSIS — G40909 Epilepsy, unspecified, not intractable, without status epilepticus: Secondary | ICD-10-CM

## 2012-06-25 DIAGNOSIS — M7020 Olecranon bursitis, unspecified elbow: Secondary | ICD-10-CM

## 2012-06-25 DIAGNOSIS — M7022 Olecranon bursitis, left elbow: Secondary | ICD-10-CM

## 2012-06-25 DIAGNOSIS — R531 Weakness: Secondary | ICD-10-CM

## 2012-06-25 DIAGNOSIS — R5383 Other fatigue: Secondary | ICD-10-CM

## 2012-06-25 DIAGNOSIS — G912 (Idiopathic) normal pressure hydrocephalus: Secondary | ICD-10-CM

## 2012-06-25 DIAGNOSIS — F0390 Unspecified dementia without behavioral disturbance: Secondary | ICD-10-CM

## 2012-06-25 MED ORDER — DONEPEZIL HCL 5 MG PO TABS: 5.0000 mg | ORAL_TABLET | Freq: Every day | ORAL | Status: DC

## 2012-06-25 MED ORDER — LEVETIRACETAM 250 MG PO TABS: 250.0000 mg | ORAL_TABLET | Freq: Two times a day (BID) | ORAL | Status: DC

## 2012-06-25 NOTE — Patient Instructions (Addendum)
Your CT scan will be done at [facility] located at [facility] in [place]; third floor. Your appointment is on Tuesday, July 9th at 11:00 am. Please arrive 15 minutes before your scheduled appointment.  [phone].

## 2012-06-25 NOTE — Patient Instructions (Signed)
It was good to see you today. We have reviewed your prior records including labs and tests today Will complete forms as requested and call you when completed Test(s) ordered today - left elbow. Your results will be called to you after review (48-72hours after test completion). If any changes need to be made, you will be notified at that time. If ok with Dr Modesto Charon, start Aricept 5mg  at bedtime for memory - Your prescription(s) have been submitted to your pharmacy. Please take as directed and contact our office if you believe you are having problem(s) with the medication(s). Other Medications reviewed, no additional changes at this time. Please schedule followup in 3-4 months, call sooner if problems.

## 2012-06-25 NOTE — Progress Notes (Signed)
Subjective:    Patient ID: [name], male    DOB: 04-Feb-1933, 78 y.o.   MRN: [id number]  HPI  Here for follow up -  Increasing confusion episodes - not related to HD days or med changes associated with balance instability and falls - L elbow injury 1 week ago still taking zofran bid for nausea with good control of chronic nausea symptoms -   Also reviewed chronic med issues: CVA with L HP and dysphagia 07/2011 -  Deficits improved - working with HHPT, s/p SNF rehab for same - home with family since 09/08/11  Seizures - dx fall 2012 after neuro eval for "AMS" and "unresponsive spells" On Keppra and imporved symptoms with fewer recurrences - last dose increase 10/2011 No incontinence, no seziure motor activity during any spells - no bowel changes, no abdominal pain  Last eval by nsurg 07/19/11: head ct ok (no recurrent NPH change since VP shunt 08/2009) no MRI due to PPM Still walking "bent over and not picking feet up" - prior ?pk dz before tx for NPH 08/2009 -    Past Medical History  Diagnosis Date  . SLEEP APNEA, OBSTRUCTIVE     CPAP qhs  . VITAMIN B12 DEFICIENCY   . PARKINSON'S DISEASE   . CORONARY ARTERY DISEASE     s/p  CABG 2003-cath 12/31/09-occluded sap dx  graft, otherwise patent grafts  . Chronic systolic heart failure     LVEF 35-40% by cath 12/31/09 s/p BiV ICD 07/2010  . Gastroparesis   . CORONARY ARTERY BYPASS GRAFT, THREE VESSEL, HX OF 2003  . AUTOMATIC IMPLANTABLE CARDIAC DEFIBRILLATOR SITU 07/2010 BiV ICD  . ANEMIA, MACROCYTIC   . DEPRESSION   . DIABETES MELLITUS, TYPE I   . GERD   . HYPERCHOLESTEROLEMIA   . HYPERTENSION   . RENAL FAILURE, END STAGE     HD MWD a/w anemia of chronic dz, hypotension  . PERIPHERAL NEUROPATHY   . History of colonoscopy   . Renal insufficiency   . Cancer   . Collagen vascular disease   . CVA (cerebral vascular accident) 07/2011    L HP and dysphagia, improved  . Seizures    Review of Systems  Constitutional: Positive for  fatigue. Negative for fever and unexpected weight change.  Respiratory: Negative for shortness of breath.   Cardiovascular: Negative for chest pain and leg swelling.  Gastrointestinal: Negative for abdominal pain, diarrhea, constipation and blood in stool.  Neurological: Negative for dizziness and facial asymmetry.       Objective:   Physical Exam  BP 122/70  Pulse 97  Temp 97.4 F (36.3 C) (Oral)  Ht 6\' 1"  (1.854 m)  Wt 198 lb (89.812 kg)  BMI 26.12 kg/m2  SpO2 94% Wt Readings from Last 3 Encounters:  06/25/12 198 lb (89.812 kg)  04/11/12 198 lb (89.812 kg)  02/29/12 200 lb (90.719 kg)   Constitutional:  he appears well-developed and well-nourished. No distress. Wife and dtr at side Neck: Normal range of motion. Neck supple. No JVD present. No thyromegaly present.  Cardiovascular: Normal rate, regular rhythm and normal heart sounds.  No murmur heard. No BLE edema Pulmonary/Chest: Effort normal and breath sounds normal. No respiratory distress. no wheezes.  MSkel: bruising and mild swelling over L olecranon bursa - normal range of motion  Neuro: AAOx2, CN2-12 symmetrically intact; MAE well - gait slow with use of cane, no detectable L HP or dysarthria Psyc: reserved affect, flat and withdrawn mood  Lab Results  Component Value Date   WBC 6.3 08/22/2011   HGB 9.8* 08/22/2011   HCT 30.0* 08/22/2011   PLT 141* 08/22/2011   CHOL 131 08/09/2011   TRIG 193* 08/09/2011   HDL 30* 08/09/2011   LDLDIRECT 68.2 07/26/2010   ALT 21 09/26/2011   AST 18 09/26/2011   NA 136 08/21/2011   K 4.0 08/21/2011   CL 97 08/21/2011   CREATININE 6.32* 08/21/2011   BUN 87* 08/21/2011   CO2 21 08/21/2011   TSH 1.296 08/15/2010   INR 1.14 08/09/2011   HGBA1C 6.9* 04/11/2012     Assessment & Plan:  Weakness, chronic and multifactorial - progressive cognitive decline likely related to underlying dementia as well as numerous chronic medical issues: hypotension, dialysis, meds - ? PK dz rx aricept -  encouraged review of same with neuro Continue orthostatic precautions, not to go unsupervised -  Forms for long term insurance and home aides signed call if SBP<100 in AM to hold toprol  L olecranon bursitis, traumatic from fall 1 week ago - check plain film - ice and supportive cae/education provided  CVA 07/2011 - L HP and dysphagia improved, occasional "choke" events persist -  cont med mgmt and OP neuro f/u

## 2012-06-25 NOTE — Progress Notes (Signed)
-   Dear Dr. Felicity Coyer, I saw [name] back in [facility] for his problem with transient spells of confusion. As you may recall, he is a 78 y.o. year old male with a history of ICD placement, renal failure with dialysis dependence, normal pressure hydrocephalus s/p VP shunt who has been having repeated spells of confusion lasting from 20 minutes to a day and a half since July. He did have a very prolonged spell of confusion in August 2012 which was associated with a fever. No obvious etiology was found at that time.   I was suspicious these spells were possible seizures. I repeated an EEG which revealed a mild to moderate encephalopathy and diffuse slowing over the right hemisphere, likely related to his shunt placement. In addition he had waxing and waning beta activities at the vertex of uncertain significance.   He had been discharged on Keppra 500 bid. I kept him on that dose. Since he was last seen he has had one prolonged spell of confusion after a lower pressure episode in the renal unit. This lasted 10 days. In addition, he has been having spells where he looks like he is acting out his dreams during naps(appears to be working with his hands). These occur daily. He also has rarer spells where he appears to be awake picking at things in the air or on his lap(these occur about 1 per week).  I did increase his Keppra to an additional 500mg  after dialysis because of these various spells. ----------------------  When I last saw him in March his confusional spells had been quiescent except for two short spells that occurred in the setting of low BP during dialysis.  We did not change the Keppra dosing. His wife and daughter report that he had another spell of prolonged confusion lasting about a week in June .  He has shorter spells of confusion as well.  He also developed fecal incontinence during the week of confusion that has persisted but improved somewhat.  He is still very  unstable on his feet that has been attributed to his NPH.  He is back to his baseline now.   Medical history, social history, and family history were reviewed and have not changed since the last clinic visit.  Current Outpatient Prescriptions on File Prior to Visit  Medication Sig Dispense Refill  . acetaminophen (TYLENOL) 325 MG tablet as needed.        Marland Kitchen aspirin 81 MG tablet Take 81 mg by mouth daily.        Marland Kitchen atorvastatin (LIPITOR) 20 MG tablet Take 20 mg by mouth daily.        . betamethasone dipropionate 0.05 % lotion Apply topically 2 (two) times daily as needed.        . calcium acetate (PHOSLO) 667 MG capsule 3 (three) times daily. 1 capsule at dinner      . camphor-menthol (SARNA) lotion Apply topically. As directed       . citalopram (CELEXA) 20 MG tablet Take 20 mg by mouth daily.        Marland Kitchen docusate sodium (COLACE) 100 MG capsule as needed.       . donepezil (ARICEPT) 5 MG tablet Take 1 tablet (5 mg total) by mouth at bedtime.  30 tablet  5  . glucagon (GLUCAGON EMERGENCY) 1 MG injection Use as directed       . hydrOXYzine (ATARAX/VISTARIL) 25 MG tablet Take AM of dialysis and every 6 hours as  needed for itch symptoms  60 tablet  1  . insulin lispro (HUMALOG) 100 UNIT/ML injection 4x a day (just before each meal) 10-03-34-15 units (the 15 units is if you eat a bedtime snack).      . insulin NPH (HUMULIN N PEN) 100 UNIT/ML injection Inject 45 Units into the skin at bedtime.  45 mL  3  . Insulin Pen Needle (B-D ULTRAFINE III SHORT PEN) 31G X 8 MM MISC Use as directed five times daily  600 each  3  . isosorbide mononitrate (IMDUR) 60 MG 24 hr tablet Take 60 mg by mouth daily.        Marland Kitchen levETIRAcetam (KEPPRA) 500 MG tablet take 1 in the a.m, 1 at night and 1 after every dialysis session  75 tablet  6  . metoprolol succinate (TOPROL-XL) 50 MG 24 hr tablet Take one and one-half tabs by mouth once daily  45 tablet  11  . multivitamin (RENA-VIT) TABS tablet Take 1 tablet by mouth daily.         Marland Kitchen NEXIUM 40 MG capsule TAKE ONE CAPSULE BY MOUTH TWICE DAILY  60 capsule  0  . nitroGLYCERIN (NITROSTAT) 0.4 MG SL tablet Place 0.4 mg under the tongue as needed.        . NON FORMULARY CPAP  For obstructive sleep apnea       . NON FORMULARY Dialysis  On Mon Wed Fri epogen 3800units,  infed 50mg ,  zemplar 2 mcg      . Nutritional Supplements (NEPRO) LIQD Take by mouth daily.        . ondansetron (ZOFRAN) 4 MG tablet Take 1 tablet (4 mg total) by mouth 2 (two) times daily as needed for nausea.  60 tablet  6  . prasugrel (EFFIENT) 10 MG TABS Take 1 tablet (10 mg total) by mouth daily.  30 tablet  8  . promethazine (PHENERGAN) 12.5 MG tablet as needed.        . ranolazine (RANEXA) 500 MG 12 hr tablet       . traMADol (ULTRAM) 50 MG tablet Take 50 mg by mouth 2 (two) times daily.       Marland Kitchen zolpidem (AMBIEN) 5 MG tablet Take 5 mg by mouth at bedtime as needed.          Allergies  Allergen Reactions  . Nifedipine   . Tape     ROS:  13 systems were reviewed and are notable for no urinary incontinence, but does not make much urine.  All other review of systems are unremarkable.  Exam: . Filed Vitals:   06/25/12 1151  BP: 126/64  Pulse: 100  Weight: 199 lb 6.4 oz (90.447 kg)    In general, well appearing man.  Mental status:   0/5 time; 5/5 place; 0/5 WORLD 0/3 recall; 2/2 naming 3/3 command/ 1/1 repetition 0/1 drawing figure; others full points 16/30  FRS: -  Cranial Nerves: Pupils are equally round and reactive to light. Visual fields full to confrontation. Extraocular movements are intact with saccadic intrusion.  Muscles of facial expression are symmetric.  Tongue protrusion, uvula, palate midline.  Shoulder shrug intact.  Mild axial rigidity.  Motor:  Normal bulk and tone, no drift and 5/5 muscle strength bilaterally.  Reflexes:  Absent throughout.  Coordination:  Normal finger to nose  Gait:  Stooped shuffling gait and station.  Romberg negative. ++postural  instability.  Impression/Recommendations:  1.  Spells of confusion - unknown etiology.  I am hesitant to  increase his Keppra but I will to 750 bid + 500 after dialysis as he did seem to respond to this before.  He is going to try to not take tramadol as it can cause confusion and if these are seizures, lower the sz threshold. 2.  Gait instability - stable, but poor.  I am going to do a CT head to look at his vents given his hx of shunt placement. 3.  Memory - poor - I suspect another neurodegenerative disorder other than NPH.  I would hold off on Aricept increase for now as they want to see how the Keppra is tolerated.  He will see about finding another neurologist in the area.  Alternatively he is welcome to see me at [facility], but this is a long distance for them.  Lupita Raider Modesto Charon, MD [facility], [place]

## 2012-06-26 DIAGNOSIS — Z0279 Encounter for issue of other medical certificate: Secondary | ICD-10-CM

## 2012-07-02 ENCOUNTER — Telehealth: Payer: Self-pay | Admitting: Neurology

## 2012-07-02 ENCOUNTER — Ambulatory Visit (INDEPENDENT_AMBULATORY_CARE_PROVIDER_SITE_OTHER)
Admission: RE | Admit: 2012-07-02 | Discharge: 2012-07-02 | Disposition: A | Discharge status: Home / Self Care | Payer: Medicare Other | Source: Ambulatory Visit | Attending: Neurology | Admitting: Neurology

## 2012-07-02 DIAGNOSIS — G912 (Idiopathic) normal pressure hydrocephalus: Secondary | ICD-10-CM

## 2012-07-02 NOTE — Telephone Encounter (Signed)
Pt's wife called to make sure she would get the results of pt's CT done today (07/02/2012).

## 2012-07-02 NOTE — Telephone Encounter (Signed)
Could you let them know that there is no change in his CT. Shunt looks ok. Thanks.

## 2012-07-03 NOTE — Telephone Encounter (Signed)
Left a voice message for [name] re: CT was without change and the shunt looks good. Asked that they call if questions.

## 2012-07-23 ENCOUNTER — Other Ambulatory Visit: Payer: Self-pay | Admitting: Gastroenterology

## 2012-08-01 ENCOUNTER — Encounter: Payer: Self-pay | Admitting: Internal Medicine

## 2012-08-01 ENCOUNTER — Ambulatory Visit (INDEPENDENT_AMBULATORY_CARE_PROVIDER_SITE_OTHER): Payer: Medicare Other | Admitting: *Deleted

## 2012-08-01 DIAGNOSIS — I5022 Chronic systolic (congestive) heart failure: Secondary | ICD-10-CM

## 2012-08-01 DIAGNOSIS — Z95810 Presence of automatic (implantable) cardiac defibrillator: Secondary | ICD-10-CM

## 2012-08-02 ENCOUNTER — Encounter: Payer: Self-pay | Admitting: *Deleted

## 2012-08-02 LAB — REMOTE ICD DEVICE
AL AMPLITUDE: 3.375 mv
AL IMPEDENCE ICD: 494 Ohm
ATRIAL PACING ICD: 0.01 pct
CHARGE TIME: 9.789 s
FVT: 0
LV LEAD IMPEDENCE ICD: 665 Ohm
LV LEAD THRESHOLD: 0.875 V
RV LEAD IMPEDENCE ICD: 665 Ohm
TOT-0001: 1
TOT-0002: 0
TOT-0006: 20110823000000
TZAT-0001ATACH: 1
TZAT-0002ATACH: NEGATIVE
TZAT-0011SLOWVT: 10 ms
TZAT-0011SLOWVT: 10 ms
TZAT-0012SLOWVT: 200 ms
TZAT-0012SLOWVT: 200 ms
TZAT-0018ATACH: NEGATIVE
TZAT-0018SLOWVT: NEGATIVE
TZAT-0018SLOWVT: NEGATIVE
TZAT-0019ATACH: 6 V
TZAT-0019ATACH: 6 V
TZAT-0019SLOWVT: 8 V
TZAT-0019SLOWVT: 8 V
TZAT-0020ATACH: 1.5 ms
TZAT-0020ATACH: 1.5 ms
TZAT-0020FASTVT: 1.5 ms
TZON-0003SLOWVT: 340 ms
TZON-0005SLOWVT: 12
TZST-0001ATACH: 4
TZST-0001ATACH: 5
TZST-0001FASTVT: 3
TZST-0001FASTVT: 5
TZST-0001SLOWVT: 4
TZST-0002ATACH: NEGATIVE
TZST-0002ATACH: NEGATIVE
TZST-0002FASTVT: NEGATIVE
TZST-0003SLOWVT: 35 J

## 2012-08-06 ENCOUNTER — Encounter: Payer: Self-pay | Admitting: *Deleted

## 2012-08-14 ENCOUNTER — Other Ambulatory Visit: Payer: Self-pay | Admitting: *Deleted

## 2012-08-14 DIAGNOSIS — E1029 Type 1 diabetes mellitus with other diabetic kidney complication: Secondary | ICD-10-CM

## 2012-08-14 MED ORDER — GLUCOSE BLOOD VI STRP: ORAL_STRIP | Status: DC

## 2012-08-14 MED ORDER — RA LANCING DEVICE MISC
1.0000 | Freq: Once | Status: DC
Start: 2012-08-14 — End: 2012-08-20

## 2012-08-14 MED ORDER — LANCETS 28G MISC: Status: DC

## 2012-08-15 ENCOUNTER — Ambulatory Visit: Payer: Medicare Other | Admitting: Endocrinology

## 2012-08-19 ENCOUNTER — Other Ambulatory Visit: Payer: Self-pay | Admitting: Gastroenterology

## 2012-08-20 ENCOUNTER — Other Ambulatory Visit: Payer: Self-pay

## 2012-08-20 DIAGNOSIS — E1029 Type 1 diabetes mellitus with other diabetic kidney complication: Secondary | ICD-10-CM

## 2012-08-20 MED ORDER — GLUCOSE BLOOD VI STRP: ORAL_STRIP | Status: AC

## 2012-08-20 MED ORDER — LANCETS 28G MISC: Status: AC

## 2012-08-20 MED ORDER — RA LANCING DEVICE MISC: 1.0000 | Freq: Once | Status: AC

## 2012-08-23 ENCOUNTER — Ambulatory Visit: Payer: Medicare Other | Admitting: Endocrinology

## 2012-08-27 ENCOUNTER — Ambulatory Visit (INDEPENDENT_AMBULATORY_CARE_PROVIDER_SITE_OTHER): Payer: Medicare Other | Admitting: Endocrinology

## 2012-08-27 ENCOUNTER — Encounter: Payer: Self-pay | Admitting: Endocrinology

## 2012-08-27 VITALS — BP 114/62 | HR 110 | Temp 98.9°F | Resp 16 | Wt 198.0 lb

## 2012-08-27 DIAGNOSIS — E1029 Type 1 diabetes mellitus with other diabetic kidney complication: Secondary | ICD-10-CM

## 2012-08-27 DIAGNOSIS — N058 Unspecified nephritic syndrome with other morphologic changes: Secondary | ICD-10-CM

## 2012-08-27 NOTE — Patient Instructions (Addendum)
Please continue humalog, (just before each meal) 10-03-34-15 units (the 15 units is if you eat a bedtime snack).  take extra novolog 5 units as needed for any sugar over 300.  Please schedule a follow-up appointment in 4 months.  continue nph, 45 units units at bedtime only.  check your blood sugar 4 times a day--before the 3 meals, and at bedtime.  also check if you have symptoms of your blood sugar being too high or too low.  please keep a record of the readings and bring it to your next appointment here.  please call us sooner if you are having low blood sugar episode.    

## 2012-08-27 NOTE — Progress Notes (Signed)
Subjective:    Patient ID: [name], male    DOB: February 15, 1933, 78 y.o.   MRN: [id number]  HPI Pt returns for f/u of IDDM (dx'ed 1977, complicated by ESRD, retinopathy, gastroparesis, CVA, CAD, and peripheral sensory neuropathy).  he brings a record of his cbg's which i have reviewed today.  It is highest in the afternoon, and in am.  He does not check at hs.   Past Medical History  Diagnosis Date  . SLEEP APNEA, OBSTRUCTIVE     CPAP qhs  . VITAMIN B12 DEFICIENCY   . PARKINSON'S DISEASE   . CORONARY ARTERY DISEASE     s/p  CABG 2003-cath 12/31/09-occluded sap dx  graft, otherwise patent grafts  . Chronic systolic heart failure     LVEF 35-40% by cath 12/31/09 s/p BiV ICD 07/2010  . Gastroparesis   . CORONARY ARTERY BYPASS GRAFT, THREE VESSEL, HX OF 2003  . AUTOMATIC IMPLANTABLE CARDIAC DEFIBRILLATOR SITU 07/2010 BiV ICD  . ANEMIA, MACROCYTIC   . DEPRESSION   . DIABETES MELLITUS, TYPE I   . GERD   . HYPERCHOLESTEROLEMIA   . HYPERTENSION   . RENAL FAILURE, END STAGE     HD MWD a/w anemia of chronic dz, hypotension  . PERIPHERAL NEUROPATHY   . History of colonoscopy   . Renal insufficiency   . Cancer   . Collagen vascular disease   . CVA (cerebral vascular accident) 07/2011    L HP and dysphagia, improved  . Seizures     Past Surgical History  Procedure Date  . Coronary artery bypass graft   . Cataract extraction, bilateral   . Pacemaker placement     permanent  . Dialysis fistula creation   . Shunt replacement   . Doppler echocardiography 2005, 2008, 2010, &2012    History   Social History  . Marital Status: Married    Spouse Name: N/A    Number of Children: 2  . Years of Education: N/A   Occupational History  . retired    Social History Main Topics  . Smoking status: Never Smoker   . Smokeless tobacco: Never Used   Comment: Married, lives with spouse, supportive dtr and son nearby. Pt is on dialysis  . Alcohol Use: No  . Drug Use: No  . Sexually Active: Not  on file   Other Topics Concern  . Not on file   Social History Narrative   Pt does not get regular exercise.Pt is on dialysis-MWF_since 10/2010Lives with spouse, supportive dtr and son nearby    Current Outpatient Prescriptions on File Prior to Visit  Medication Sig Dispense Refill  . acetaminophen (TYLENOL) 325 MG tablet as needed.        Marland Kitchen aspirin 81 MG tablet Take 81 mg by mouth daily.        Marland Kitchen atorvastatin (LIPITOR) 20 MG tablet Take 20 mg by mouth daily.        . betamethasone dipropionate 0.05 % lotion Apply topically 2 (two) times daily as needed.        . calcium acetate (PHOSLO) 667 MG capsule 3 (three) times daily. 1 capsule at dinner      . camphor-menthol (SARNA) lotion Apply topically. As directed       . citalopram (CELEXA) 20 MG tablet Take 20 mg by mouth daily.        Marland Kitchen docusate sodium (COLACE) 100 MG capsule as needed.       . donepezil (ARICEPT) 5 MG tablet Take  1 tablet (5 mg total) by mouth at bedtime.  30 tablet  5  . glucagon (GLUCAGON EMERGENCY) 1 MG injection Use as directed       . glucose blood (TRUETEST TEST) test strip Use as instructed to check blood sugar four times a day dx 250.41  200 each  3  . hydrOXYzine (ATARAX/VISTARIL) 25 MG tablet Take AM of dialysis and every 6 hours as needed for itch symptoms  60 tablet  1  . insulin lispro (HUMALOG) 100 UNIT/ML injection 4x a day (just before each meal) 10-03-34-15 units (the 15 units is if you eat a bedtime snack).      . insulin NPH (HUMULIN N PEN) 100 UNIT/ML injection Inject 45 Units into the skin at bedtime.  45 mL  3  . Insulin Pen Needle (B-D ULTRAFINE III SHORT PEN) 31G X 8 MM MISC Use as directed five times daily  600 each  3  . isosorbide mononitrate (IMDUR) 60 MG 24 hr tablet Take 60 mg by mouth daily.        Demetra Shiner Devices (RA LANCING DEVICE) MISC 1 Device by Does not apply route once.  1 each  0  . Lancets 28G MISC Use as directed to check blood sugar four times daily dx 250.41  200 each  3  .  levETIRAcetam (KEPPRA) 250 MG tablet Take 1 tablet (250 mg total) by mouth every 12 (twelve) hours. take in addition to 500mg  bid of Keppra.  60 tablet  11  . levETIRAcetam (KEPPRA) 500 MG tablet take 1 in the a.m, 1 at night and 1 after every dialysis session  75 tablet  6  . metoprolol succinate (TOPROL-XL) 50 MG 24 hr tablet Take one and one-half tabs by mouth once daily  45 tablet  11  . multivitamin (RENA-VIT) TABS tablet Take 1 tablet by mouth daily.        Marland Kitchen NEXIUM 40 MG capsule TAKE ONE CAPSULE BY MOUTH TWICE DAILY  180 capsule  0  . nitroGLYCERIN (NITROSTAT) 0.4 MG SL tablet Place 0.4 mg under the tongue as needed.        . NON FORMULARY CPAP  For obstructive sleep apnea       . NON FORMULARY Dialysis  On Mon Wed Fri epogen 3800units,  infed 50mg ,  zemplar 2 mcg      . Nutritional Supplements (NEPRO) LIQD Take by mouth daily.        . ondansetron (ZOFRAN) 4 MG tablet Take 1 tablet (4 mg total) by mouth 2 (two) times daily as needed for nausea.  60 tablet  6  . prasugrel (EFFIENT) 10 MG TABS Take 1 tablet (10 mg total) by mouth daily.  30 tablet  8  . promethazine (PHENERGAN) 12.5 MG tablet as needed.        . ranolazine (RANEXA) 500 MG 12 hr tablet       . traMADol (ULTRAM) 50 MG tablet Take 50 mg by mouth 2 (two) times daily.       Marland Kitchen zolpidem (AMBIEN) 5 MG tablet Take 5 mg by mouth at bedtime as needed.          Allergies  Allergen Reactions  . Nifedipine   . Tape     Family History  Problem Relation Age of Onset  . Stroke Mother   . Diabetes Father   . Lung cancer Father   . Diabetes Brother   . Stomach cancer Paternal Aunt   . Colon cancer  Neg Hx    BP 114/62  Pulse 110  Temp 98.9 F (37.2 C) (Oral)  Resp 16  Wt 198 lb (89.812 kg)  SpO2 92%  Review of Systems denies hypoglycemia    Objective:   Physical Exam VITAL SIGNS:  See vs page GENERAL: no distress SKIN:  Insulin injection sites at the anterior abdomen are normal   (wife says a1c was 6.6 last  week).      Assessment & Plan:  DM, well-controlled

## 2012-09-30 ENCOUNTER — Observation Stay (HOSPITAL_COMMUNITY)
Admission: EM | Admit: 2012-09-30 | Discharge: 2012-10-03 | DRG: 091 | Disposition: A | Discharge status: Home Health | Payer: Medicare Other | Attending: Family Medicine | Admitting: Family Medicine

## 2012-09-30 ENCOUNTER — Emergency Department (HOSPITAL_COMMUNITY): Payer: Medicare Other

## 2012-09-30 ENCOUNTER — Encounter (HOSPITAL_COMMUNITY): Payer: Self-pay | Admitting: *Deleted

## 2012-09-30 DIAGNOSIS — R627 Adult failure to thrive: Principal | ICD-10-CM | POA: Insufficient documentation

## 2012-09-30 DIAGNOSIS — Z982 Presence of cerebrospinal fluid drainage device: Secondary | ICD-10-CM

## 2012-09-30 DIAGNOSIS — R0989 Other specified symptoms and signs involving the circulatory and respiratory systems: Secondary | ICD-10-CM

## 2012-09-30 DIAGNOSIS — L259 Unspecified contact dermatitis, unspecified cause: Secondary | ICD-10-CM

## 2012-09-30 DIAGNOSIS — R112 Nausea with vomiting, unspecified: Secondary | ICD-10-CM

## 2012-09-30 DIAGNOSIS — E213 Hyperparathyroidism, unspecified: Secondary | ICD-10-CM | POA: Insufficient documentation

## 2012-09-30 DIAGNOSIS — G40909 Epilepsy, unspecified, not intractable, without status epilepticus: Secondary | ICD-10-CM

## 2012-09-30 DIAGNOSIS — E538 Deficiency of other specified B group vitamins: Secondary | ICD-10-CM

## 2012-09-30 DIAGNOSIS — I5022 Chronic systolic (congestive) heart failure: Secondary | ICD-10-CM

## 2012-09-30 DIAGNOSIS — D631 Anemia in chronic kidney disease: Secondary | ICD-10-CM | POA: Insufficient documentation

## 2012-09-30 DIAGNOSIS — R531 Weakness: Secondary | ICD-10-CM

## 2012-09-30 DIAGNOSIS — G4733 Obstructive sleep apnea (adult) (pediatric): Secondary | ICD-10-CM

## 2012-09-30 DIAGNOSIS — F3289 Other specified depressive episodes: Secondary | ICD-10-CM

## 2012-09-30 DIAGNOSIS — F329 Major depressive disorder, single episode, unspecified: Secondary | ICD-10-CM

## 2012-09-30 DIAGNOSIS — I120 Hypertensive chronic kidney disease with stage 5 chronic kidney disease or end stage renal disease: Secondary | ICD-10-CM | POA: Insufficient documentation

## 2012-09-30 DIAGNOSIS — Z951 Presence of aortocoronary bypass graft: Secondary | ICD-10-CM

## 2012-09-30 DIAGNOSIS — G609 Hereditary and idiopathic neuropathy, unspecified: Secondary | ICD-10-CM

## 2012-09-30 DIAGNOSIS — E119 Type 2 diabetes mellitus without complications: Secondary | ICD-10-CM | POA: Insufficient documentation

## 2012-09-30 DIAGNOSIS — K2960 Other gastritis without bleeding: Secondary | ICD-10-CM

## 2012-09-30 DIAGNOSIS — D539 Nutritional anemia, unspecified: Secondary | ICD-10-CM

## 2012-09-30 DIAGNOSIS — Z992 Dependence on renal dialysis: Secondary | ICD-10-CM | POA: Insufficient documentation

## 2012-09-30 DIAGNOSIS — R5383 Other fatigue: Secondary | ICD-10-CM | POA: Diagnosis present

## 2012-09-30 DIAGNOSIS — Z95810 Presence of automatic (implantable) cardiac defibrillator: Secondary | ICD-10-CM

## 2012-09-30 DIAGNOSIS — E1029 Type 1 diabetes mellitus with other diabetic kidney complication: Secondary | ICD-10-CM | POA: Diagnosis present

## 2012-09-30 DIAGNOSIS — R110 Nausea: Secondary | ICD-10-CM

## 2012-09-30 DIAGNOSIS — R262 Difficulty in walking, not elsewhere classified: Secondary | ICD-10-CM

## 2012-09-30 DIAGNOSIS — K219 Gastro-esophageal reflux disease without esophagitis: Secondary | ICD-10-CM

## 2012-09-30 DIAGNOSIS — R5381 Other malaise: Secondary | ICD-10-CM | POA: Insufficient documentation

## 2012-09-30 DIAGNOSIS — E7800 Pure hypercholesterolemia, unspecified: Secondary | ICD-10-CM

## 2012-09-30 DIAGNOSIS — G20 Parkinson's disease: Secondary | ICD-10-CM | POA: Diagnosis present

## 2012-09-30 DIAGNOSIS — I2510 Atherosclerotic heart disease of native coronary artery without angina pectoris: Secondary | ICD-10-CM

## 2012-09-30 DIAGNOSIS — I6330 Cerebral infarction due to thrombosis of unspecified cerebral artery: Secondary | ICD-10-CM

## 2012-09-30 DIAGNOSIS — K3184 Gastroparesis: Secondary | ICD-10-CM

## 2012-09-30 DIAGNOSIS — G912 (Idiopathic) normal pressure hydrocephalus: Secondary | ICD-10-CM

## 2012-09-30 DIAGNOSIS — G20A1 Parkinson's disease without dyskinesia, without mention of fluctuations: Secondary | ICD-10-CM

## 2012-09-30 DIAGNOSIS — Z8673 Personal history of transient ischemic attack (TIA), and cerebral infarction without residual deficits: Secondary | ICD-10-CM | POA: Insufficient documentation

## 2012-09-30 DIAGNOSIS — C44300 Unspecified malignant neoplasm of skin of unspecified part of face: Secondary | ICD-10-CM

## 2012-09-30 DIAGNOSIS — N186 End stage renal disease: Secondary | ICD-10-CM

## 2012-09-30 DIAGNOSIS — R269 Unspecified abnormalities of gait and mobility: Secondary | ICD-10-CM

## 2012-09-30 DIAGNOSIS — N039 Chronic nephritic syndrome with unspecified morphologic changes: Secondary | ICD-10-CM | POA: Insufficient documentation

## 2012-09-30 DIAGNOSIS — I10 Essential (primary) hypertension: Secondary | ICD-10-CM

## 2012-09-30 LAB — URINALYSIS, ROUTINE W REFLEX MICROSCOPIC
Hgb urine dipstick: NEGATIVE
Nitrite: NEGATIVE
Specific Gravity, Urine: 1.017 (ref 1.005–1.030)
Urobilinogen, UA: 0.2 mg/dL (ref 0.0–1.0)
pH: 5 (ref 5.0–8.0)

## 2012-09-30 LAB — COMPREHENSIVE METABOLIC PANEL
ALT: 17 U/L (ref 0–53)
AST: 18 U/L (ref 0–37)
Calcium: 10.1 mg/dL (ref 8.4–10.5)
Creatinine, Ser: 6.99 mg/dL — ABNORMAL HIGH (ref 0.50–1.35)
GFR calc Af Amer: 8 mL/min — ABNORMAL LOW (ref >90)
GFR calc non Af Amer: 7 mL/min — ABNORMAL LOW (ref >90)
Sodium: 137 mEq/L (ref 135–145)
Total Protein: 8.1 g/dL (ref 6.0–8.3)

## 2012-09-30 LAB — CBC WITH DIFFERENTIAL/PLATELET
Basophils Absolute: 0 10*3/uL (ref 0.0–0.1)
Eosinophils Absolute: 0.1 10*3/uL (ref 0.0–0.7)
Eosinophils Relative: 1 % (ref 0–5)
HCT: 34.9 % — ABNORMAL LOW (ref 39.0–52.0)
MCH: 31.9 pg (ref 26.0–34.0)
MCHC: 35.2 g/dL (ref 30.0–36.0)
MCV: 90.6 fL (ref 78.0–100.0)
Monocytes Absolute: 1.2 10*3/uL — ABNORMAL HIGH (ref 0.1–1.0)
Platelets: 193 10*3/uL (ref 150–400)
RDW: 14.9 % (ref 11.5–15.5)
WBC: 12.7 10*3/uL — ABNORMAL HIGH (ref 4.0–10.5)

## 2012-09-30 LAB — TROPONIN I: Troponin I: <0.3 ng/mL (ref <0.30)

## 2012-09-30 LAB — OCCULT BLOOD, POC DEVICE: Fecal Occult Bld: NEGATIVE

## 2012-09-30 LAB — URINE MICROSCOPIC-ADD ON

## 2012-09-30 NOTE — ED Notes (Signed)
Pt dizzy during ambulation.  Could not leave room

## 2012-09-30 NOTE — ED Notes (Signed)
The pt has been lethargic for 1-2 weeks.  Dialysis pt he had a fistula in his lt forearm. He was seen at [facility] and had a fistula gram at [facility].  He came back to town to be dialyzed which he completed.  His wife says the pt has been lethargic for 1-2 weeks.  Here today for the same

## 2012-09-30 NOTE — ED Provider Notes (Signed)
History     CSN: [id number]  Arrival date & time 09/30/12  1732   First MD Initiated Contact with Patient 09/30/12 1953      Chief Complaint  Patient presents with  . lethargic     HPI   78 year old male with past history of obstructive sleep apnea Parkinson's disease coronary artery disease status post CABG in 2003, diabetes, anemia, depression, hypertension, end-stage renal disease on hemodialysis Monday Wednesdays and Fridays, and prior stroke. Patient presents with one-week history of progressive generalized weakness. Denies any headache, denies any chest pain, shortness of breath, nausea, vomiting, abdominal pain. The patient further denies any frequency any dysuria, denies any cough or fevers. The patient is compliant with medications and has not missed any of his dialysis sessions.  Past Medical History  Diagnosis Date  . SLEEP APNEA, OBSTRUCTIVE     CPAP qhs  . VITAMIN B12 DEFICIENCY   . PARKINSON'S DISEASE   . CORONARY ARTERY DISEASE     s/p  CABG 2003-cath 12/31/09-occluded sap dx  graft, otherwise patent grafts  . Chronic systolic heart failure     LVEF 35-40% by cath 12/31/09 s/p BiV ICD 07/2010  . Gastroparesis   . CORONARY ARTERY BYPASS GRAFT, THREE VESSEL, HX OF 2003  . AUTOMATIC IMPLANTABLE CARDIAC DEFIBRILLATOR SITU 07/2010 BiV ICD  . ANEMIA, MACROCYTIC   . DEPRESSION   . DIABETES MELLITUS, TYPE I   . GERD   . HYPERCHOLESTEROLEMIA   . HYPERTENSION   . RENAL FAILURE, END STAGE     HD MWD a/w anemia of chronic dz, hypotension  . PERIPHERAL NEUROPATHY   . History of colonoscopy   . Renal insufficiency   . Cancer   . Collagen vascular disease   . CVA (cerebral vascular accident) 07/2011    L HP and dysphagia, improved  . Seizures     Past Surgical History  Procedure Date  . Coronary artery bypass graft   . Cataract extraction, bilateral   . Pacemaker placement     permanent  . Dialysis fistula creation   . Shunt replacement   . Doppler echocardiography  2005, 2008, 2010, &2012    Family History  Problem Relation Age of Onset  . Stroke Mother   . Diabetes Father   . Lung cancer Father   . Diabetes Brother   . Stomach cancer Paternal Aunt   . Colon cancer Neg Hx     History  Substance Use Topics  . Smoking status: Never Smoker   . Smokeless tobacco: Never Used   Comment: Married, lives with spouse, supportive dtr and son nearby. Pt is on dialysis  . Alcohol Use: No      Review of Systems  Constitutional: Positive for fatigue ( Generalized.  ). Negative for fever, activity change and appetite change.  HENT: Negative for ear pain, congestion, sore throat, rhinorrhea, neck pain, neck stiffness and sinus pressure.   Eyes: Negative for pain and redness.  Respiratory: Negative for cough, chest tightness and shortness of breath.   Cardiovascular: Negative for chest pain and palpitations.  Gastrointestinal: Negative for nausea, vomiting, abdominal pain, diarrhea and abdominal distention.  Genitourinary: Negative for dysuria, flank pain and difficulty urinating.  Musculoskeletal: Negative for back pain.  Skin: Negative for rash and wound.  Neurological: Negative for dizziness, light-headedness, numbness and headaches.  Hematological: Negative for adenopathy.  Psychiatric/Behavioral: Negative for behavioral problems, confusion and agitation.    Allergies  Nifedipine and Tape  Home Medications   Current Outpatient Rx  Name Route Sig Dispense Refill  . ACETAMINOPHEN 325 MG PO TABS Oral Take 325 mg by mouth every 4 (four) hours as needed. For pain    . ASPIRIN 81 MG PO TABS Oral Take 81 mg by mouth daily.      . ATORVASTATIN CALCIUM 20 MG PO TABS Oral Take 20 mg by mouth daily.      Marland Kitchen CALCIUM ACETATE 667 MG PO CAPS  3 (three) times daily. 1 capsule at dinner    . CHOLECALCIFEROL 400 UNITS PO TABS Oral Take 1,000 Units by mouth.    Marland Kitchen CITALOPRAM HYDROBROMIDE 20 MG PO TABS Oral Take 20 mg by mouth daily.      Marland Kitchen DOCUSATE SODIUM 100  MG PO CAPS Oral Take 100 mg by mouth 2 (two) times daily as needed. For constipation    . GLUCOSE BLOOD VI STRP  Use as instructed to check blood sugar four times a day dx 250.41 200 each 3  . HYDROXYZINE HCL 25 MG PO TABS  Take AM of dialysis and every 6 hours as needed for itch symptoms 60 tablet 1  . INSULIN LISPRO (HUMAN) 100 UNIT/ML [place] SOLN Subcutaneous Inject 10-35 Units into the skin See admin instructions. 4x a day (just before each meal) 10-03-34-15 units (the 15 units is if you eat a bedtime snack).    . INSULIN ISOPHANE HUMAN 100 UNIT/ML [place] SUSP Subcutaneous Inject 45 Units into the skin at bedtime. 45 mL 3  . INSULIN PEN NEEDLE 31G X 8 MM MISC  Use as directed five times daily 600 each 3  . ISOSORBIDE MONONITRATE ER 60 MG PO TB24 Oral Take 60 mg by mouth daily.      Marland Kitchen RA LANCING DEVICE MISC Does not apply 1 Device by Does not apply route once. 1 each 0    Dx 250.41  . LANCETS 28G MISC  Use as directed to check blood sugar four times daily dx 250.41 200 each 3  . LEVETIRACETAM 250 MG PO TABS Oral Take 1 tablet (250 mg total) by mouth every 12 (twelve) hours. take in addition to 500mg  bid of Keppra. 60 tablet 11  . LEVETIRACETAM 500 MG PO TABS  take 1 in the a.m, 1 at night and 1 after every dialysis session 75 tablet 6  . METOPROLOL SUCCINATE ER 50 MG PO TB24  Take one and one-half tabs by mouth once daily 45 tablet 11  . RENA-VITE PO TABS Oral Take 1 tablet by mouth daily.      Marland Kitchen NEXIUM 40 MG PO CPDR  TAKE ONE CAPSULE BY MOUTH TWICE DAILY 180 capsule 0    **Patient requests 90 day supply**  . NITROGLYCERIN 0.4 MG SL SUBL Sublingual Place 0.4 mg under the tongue every 5 (five) minutes as needed. For chest pain    . NON FORMULARY  CPAP  For obstructive sleep apnea     . NON FORMULARY  Dialysis  On Mon Wed Fri epogen 3800units,  infed 50mg ,  zemplar 2 mcg    . NEPRO PO LIQD Oral Take 1 Can by mouth at bedtime.     Marland Kitchen ONDANSETRON HCL 4 MG PO TABS Oral Take 1 tablet (4 mg total) by  mouth 2 (two) times daily as needed for nausea. 60 tablet 6  . OXYCODONE HCL 5 MG PO CAPS Oral Take 5 mg by mouth every 4 (four) hours as needed. For pain    . PRASUGREL HCL 10 MG PO TABS Oral Take 1 tablet (  10 mg total) by mouth daily. 30 tablet 8  . PROMETHAZINE HCL 12.5 MG PO TABS Oral Take 12.5 mg by mouth every 4 (four) hours as needed. For nausea    . RANOLAZINE ER 500 MG PO TB12 Oral Take 500 mg by mouth every evening.     Marland Kitchen TRAMADOL HCL 50 MG PO TABS Oral Take 50 mg by mouth 2 (two) times daily.     Marland Kitchen ZOLPIDEM TARTRATE 5 MG PO TABS Oral Take 5 mg by mouth at bedtime as needed. For insomnia    . GLUCAGON (RDNA) 1 MG IJ KIT  Use as directed       BP 132/66  Pulse 100  Temp 98.3 F (36.8 C) (Oral)  Resp 16  SpO2 100%  Physical Exam  Constitutional: He is oriented to person, place, and time. He appears well-developed and well-nourished. No distress.  HENT:  Head: Normocephalic and atraumatic.  Nose: Nose normal.  Mouth/Throat: Oropharynx is clear and moist.  Eyes: Conjunctivae normal and EOM are normal. Pupils are equal, round, and reactive to light.  Neck: Normal range of motion. Neck supple. No tracheal deviation present.  Cardiovascular: Normal rate, regular rhythm, normal heart sounds and intact distal pulses.   Pulmonary/Chest: Effort normal and breath sounds normal. No respiratory distress. He has no rales.  Abdominal: Soft. Bowel sounds are normal. He exhibits no distension. There is no tenderness. There is no rebound and no guarding.  Musculoskeletal: Normal range of motion. He exhibits no edema and no tenderness.  Neurological: He is alert and oriented to person, place, and time. No cranial nerve deficit or sensory deficit. Gait ( Patient unable to and been more than 2 steps. ) abnormal. GCS eye subscore is 4. GCS verbal subscore is 5. GCS motor subscore is 6.  Skin: Skin is warm and dry.  Psychiatric: He has a normal mood and affect. His behavior is normal.    ED  Course  Procedures (including critical care time)  Labs Reviewed  CBC WITH DIFFERENTIAL - Abnormal; Notable for the following:    WBC 12.7 (*)     RBC 3.85 (*)     Hemoglobin 12.3 (*)     HCT 34.9 (*)     Neutrophils Relative 82 (*)     Neutro Abs 10.4 (*)     Lymphocytes Relative 8 (*)     Monocytes Absolute 1.2 (*)     All other components within normal limits  COMPREHENSIVE METABOLIC PANEL - Abnormal; Notable for the following:    Chloride 92 (*)     Glucose, Bld 184 (*)     BUN 37 (*)     Creatinine, Ser 6.99 (*)     Alkaline Phosphatase 26 (*)     GFR calc non Af Amer 7 (*)     GFR calc Af Amer 8 (*)     All other components within normal limits  URINALYSIS, ROUTINE W REFLEX MICROSCOPIC - Abnormal; Notable for the following:    APPearance HAZY (*)     Glucose, UA 100 (*)     Bilirubin Urine SMALL (*)     Protein, ur 30 (*)     Leukocytes, UA SMALL (*)     All other components within normal limits  TROPONIN I  OCCULT BLOOD, POC DEVICE  URINE MICROSCOPIC-ADD ON      DG Abd 1 View (Final result)   Result time:09/30/12 2348    Final result by Rad Results In Interface (09/30/12 23:48:24)  Narrative:   *RADIOLOGY REPORT*  Clinical Data: VP shunt, no abdominal complaints  ABDOMEN - 1 VIEW  Comparison: 08/16/2011;  Findings:  Ventriculoperitoneal catheter tubing overlies the right lower abdominal quadrant. Note, the mid aspect of the catheter tubing overlying the peripheral right lateral aspect of abdomen is excluded from view.  Paucity of bowel gas without definite evidence of obstruction. No definite abnormal intra-abdominal calcifications.  Lumbar and bilateral hip degenerative change.  IMPRESSION: 1. Ventriculoperitoneal catheter tubing overlies the right mid hemiabdomen, though note, the mid aspect of the catheter tubing is excluded from view. 2. Paucity of bowel gas without definite evidence of obstruction.   Original Report Authenticated By:  Waynard Reeds, M.D.             DG Chest 2 View (Final result)   Result time:09/30/12 2104    Final result by Rad Results In Interface (09/30/12 21:04:13)    Narrative:   *RADIOLOGY REPORT*  Clinical Data: Cough  CHEST - 2 VIEW  Comparison: 08/14/2011  Findings: Right subclavian AICD device and leads are stable. Clear lungs. Normal heart size. No pleural effusion. No pneumothorax. VP shunt extends across the right hemithorax and neck.  IMPRESSION: No active cardiopulmonary disease.   Original Report Authenticated By: Donavan Burnet, M.D.        CT Head Wo Contrast (Final result)   Result time:10/01/12 [phone]    Final result by Rad Results In Interface (10/01/12 00:26:54)    Narrative:   *RADIOLOGY REPORT*  Clinical Data: 78 year old male with lethargy. Dialysis. Weakness. VP shunt.  CT HEAD WITHOUT CONTRAST  Technique: Contiguous axial images were obtained from the base of the skull through the vertex without contrast.  Comparison: 07/02/2012 and earlier.  Findings: Visualized paranasal sinuses and mastoids are clear. Stable visualized osseous structures. Stable scalp soft tissues with posterior convexity shunt and reservoir. Stable orbit soft tissues.  Calcified atherosclerosis at the skull base. Stable right parietal approach ventriculostomy catheter terminating just across midline at the level of the septum pellucidum. Stable ventriculomegaly. No transependymal edema is evident. No midline shift, mass effect, or evidence of mass lesion. No acute intracranial hemorrhage identified. Stable gray-white matter differentiation throughout the brain. No evidence of cortically based acute infarction identified. No suspicious intracranial vascular hyperdensity.  IMPRESSION: Stable ventriculomegaly, ventricular shunt, and noncontrast CT appearance of the brain.   Original Report Authenticated By: Ulla Potash III, M.D.            1. Weakness   2.  Inability to walk   3. Nausea and vomiting       MDM    78 year old male with an extensive past medical history he presents afebrile but tachycardic in no acute distress, nontoxic appearing. 1-2 weeks history of generalized weakness with difficulty walking. Given history of a VP shunt CT head without contrast chest x-ray and KUB were ordered. C. demonstrated stable ventriculomegaly. Patient denies any chest pain EKG unremarkable troponin negative. Although patient is an end-stage renal disease he does make some urine a UA was ordered showed small leukocytes otherwise rare bacteria and negative for nitrites do not suspect UTI. Patient was Hemoccult negative is mild anemia with hemoglobin of 12.3 hematocrit 34.9. Case discussed with Dr. Danielle Dess will evaluate the patient. Case discussed with Dr. Onalee Hua patient admitted to the step down unit for further management and care.        Nadara Mustard, MD 10/01/12 [phone]

## 2012-10-01 ENCOUNTER — Emergency Department (HOSPITAL_COMMUNITY): Payer: Medicare Other

## 2012-10-01 ENCOUNTER — Encounter (HOSPITAL_COMMUNITY): Payer: Self-pay | Admitting: Radiology

## 2012-10-01 DIAGNOSIS — Z95810 Presence of automatic (implantable) cardiac defibrillator: Secondary | ICD-10-CM

## 2012-10-01 DIAGNOSIS — N186 End stage renal disease: Secondary | ICD-10-CM

## 2012-10-01 DIAGNOSIS — R269 Unspecified abnormalities of gait and mobility: Secondary | ICD-10-CM | POA: Diagnosis present

## 2012-10-01 DIAGNOSIS — R5383 Other fatigue: Secondary | ICD-10-CM | POA: Diagnosis present

## 2012-10-01 DIAGNOSIS — Z982 Presence of cerebrospinal fluid drainage device: Secondary | ICD-10-CM

## 2012-10-01 DIAGNOSIS — I5022 Chronic systolic (congestive) heart failure: Secondary | ICD-10-CM

## 2012-10-01 DIAGNOSIS — G912 (Idiopathic) normal pressure hydrocephalus: Secondary | ICD-10-CM | POA: Diagnosis present

## 2012-10-01 DIAGNOSIS — R5381 Other malaise: Secondary | ICD-10-CM

## 2012-10-01 DIAGNOSIS — R112 Nausea with vomiting, unspecified: Secondary | ICD-10-CM

## 2012-10-01 LAB — BASIC METABOLIC PANEL
BUN: 47 mg/dL — ABNORMAL HIGH (ref 6–23)
Calcium: 9.7 mg/dL (ref 8.4–10.5)
Creatinine, Ser: 8.83 mg/dL — ABNORMAL HIGH (ref 0.50–1.35)
GFR calc non Af Amer: 5 mL/min — ABNORMAL LOW (ref >90)
Glucose, Bld: 186 mg/dL — ABNORMAL HIGH (ref 70–99)

## 2012-10-01 LAB — GRAM STAIN

## 2012-10-01 LAB — CBC
Hemoglobin: 11.1 g/dL — ABNORMAL LOW (ref 13.0–17.0)
MCH: 32.5 pg (ref 26.0–34.0)
MCHC: 35.5 g/dL (ref 30.0–36.0)
Platelets: 148 10*3/uL — ABNORMAL LOW (ref 150–400)
RDW: 15 % (ref 11.5–15.5)

## 2012-10-01 LAB — GLUCOSE, CAPILLARY: Glucose-Capillary: 325 mg/dL — ABNORMAL HIGH (ref 70–99)

## 2012-10-01 LAB — CSF CELL COUNT WITH DIFFERENTIAL
Eosinophils, CSF: 0 % (ref 0–1)
Eosinophils, CSF: 0 % (ref 0–1)
RBC Count, CSF: 150 /mm3 — ABNORMAL HIGH
Tube #: 4
WBC, CSF: 0 /mm3 (ref 0–5)

## 2012-10-01 LAB — MRSA PCR SCREENING: MRSA by PCR: NEGATIVE

## 2012-10-01 LAB — CRYPTOCOCCAL ANTIGEN, CSF: Crypto Ag: NEGATIVE

## 2012-10-01 LAB — HEMOGLOBIN A1C: Hgb A1c MFr Bld: 7.3 % — ABNORMAL HIGH (ref <5.7)

## 2012-10-01 LAB — PROTEIN AND GLUCOSE, CSF
Glucose, CSF: 124 mg/dL — ABNORMAL HIGH (ref 43–76)
Total  Protein, CSF: 69 mg/dL — ABNORMAL HIGH (ref 15–45)

## 2012-10-01 MED ORDER — CITALOPRAM HYDROBROMIDE 20 MG PO TABS
20.0000 mg | ORAL_TABLET | Freq: Every day | ORAL | Status: DC
  Administered 2012-10-01 – 2012-10-03 (×3): 20 mg via ORAL
  Filled 2012-10-01 (×3): qty 1

## 2012-10-01 MED ORDER — ONDANSETRON HCL 4 MG/2ML IJ SOLN: 4.0000 mg | Freq: Three times a day (TID) | INTRAMUSCULAR | Status: DC | PRN

## 2012-10-01 MED ORDER — ONDANSETRON HCL 4 MG/2ML IJ SOLN: 4.0000 mg | Freq: Four times a day (QID) | INTRAMUSCULAR | Status: DC | PRN

## 2012-10-01 MED ORDER — PARICALCITOL 1 MCG PO CAPS
1.0000 ug | ORAL_CAPSULE | ORAL | Status: DC
  Administered 2012-10-02: 1 ug via ORAL
  Filled 2012-10-01 (×2): qty 1

## 2012-10-01 MED ORDER — ONDANSETRON HCL 4 MG/2ML IJ SOLN
4.0000 mg | Freq: Once | INTRAMUSCULAR | Status: AC
  Administered 2012-10-01: 4 mg via INTRAVENOUS
  Filled 2012-10-01: qty 2

## 2012-10-01 MED ORDER — ATORVASTATIN CALCIUM 20 MG PO TABS
20.0000 mg | ORAL_TABLET | Freq: Every day | ORAL | Status: DC
  Administered 2012-10-01 – 2012-10-03 (×3): 20 mg via ORAL
  Filled 2012-10-01 (×3): qty 1

## 2012-10-01 MED ORDER — ONDANSETRON HCL 4 MG/2ML IJ SOLN
INTRAMUSCULAR | Status: AC
  Filled 2012-10-01: qty 2

## 2012-10-01 MED ORDER — PRASUGREL HCL 10 MG PO TABS
10.0000 mg | ORAL_TABLET | Freq: Every day | ORAL | Status: DC
  Administered 2012-10-01 – 2012-10-03 (×3): 10 mg via ORAL
  Filled 2012-10-01 (×3): qty 1

## 2012-10-01 MED ORDER — HEPARIN SODIUM (PORCINE) 1000 UNIT/ML DIALYSIS: 20.0000 [IU]/kg | INTRAMUSCULAR | Status: DC | PRN

## 2012-10-01 MED ORDER — SODIUM CHLORIDE 0.9 % IJ SOLN
3.0000 mL | Freq: Two times a day (BID) | INTRAMUSCULAR | Status: DC
  Administered 2012-10-01 – 2012-10-03 (×4): 3 mL via INTRAVENOUS

## 2012-10-01 MED ORDER — LEVETIRACETAM 250 MG PO TABS
250.0000 mg | ORAL_TABLET | Freq: Two times a day (BID) | ORAL | Status: DC
  Administered 2012-10-01 – 2012-10-03 (×5): 250 mg via ORAL
  Filled 2012-10-01 (×6): qty 1

## 2012-10-01 MED ORDER — INSULIN ASPART 100 UNIT/ML ~~LOC~~ SOLN
0.0000 [IU] | Freq: Three times a day (TID) | SUBCUTANEOUS | Status: DC
  Administered 2012-10-01: 3 [IU] via SUBCUTANEOUS
  Administered 2012-10-02: 11 [IU] via SUBCUTANEOUS
  Administered 2012-10-02: 2 [IU] via SUBCUTANEOUS
  Administered 2012-10-03 (×2): 8 [IU] via SUBCUTANEOUS

## 2012-10-01 MED ORDER — ONDANSETRON HCL 4 MG PO TABS: 4.0000 mg | ORAL_TABLET | Freq: Four times a day (QID) | ORAL | Status: DC | PRN

## 2012-10-01 MED ORDER — SODIUM CHLORIDE 0.9 % IJ SOLN: 3.0000 mL | INTRAMUSCULAR | Status: DC | PRN

## 2012-10-01 MED ORDER — RANOLAZINE ER 500 MG PO TB12
500.0000 mg | ORAL_TABLET | Freq: Every evening | ORAL | Status: DC
  Administered 2012-10-01 – 2012-10-02 (×2): 500 mg via ORAL
  Filled 2012-10-01 (×3): qty 1

## 2012-10-01 MED ORDER — SODIUM CHLORIDE 0.9 % IV SOLN: 250.0000 mL | INTRAVENOUS | Status: DC | PRN

## 2012-10-01 MED ORDER — ISOSORBIDE MONONITRATE ER 60 MG PO TB24
60.0000 mg | ORAL_TABLET | Freq: Every day | ORAL | Status: DC
Start: 2012-10-01 — End: 2012-10-03
  Administered 2012-10-01 – 2012-10-03 (×3): 60 mg via ORAL
  Filled 2012-10-01 (×3): qty 1

## 2012-10-01 NOTE — Procedures (Signed)
After discussing the procedure with the patient and his son and obtaining appropriate consent the patient was placed in the seated position leaning forward over a bedside table. The back was prepped with Betadine solution. The area approximately of L2 and L3 was palpated. The skin was infiltrated with 1 cc lidocaine without epinephrine. A 20-gauge spinal needle was then inserted into the interlaminar space at L2-L3. This apparently occurred dramatically as initially some bloody fluid was returned. This cleared rapidly. The opening pressure was measured at 90 mm of water after the patient was ligated into the recumbent position. A total of 25 cc of spinal fluid was drained from the lumbar puncture. The closing pressure was 60 mm of water. The ultimate fluid color was clear and colorless. The needle was withdrawn. Patient tolerated the procedure well.Marland Kitchen

## 2012-10-01 NOTE — ED Notes (Signed)
Report given to 3100 unit nurse, transported in stable condition , IV site intact. No pain / respirations unlabored.

## 2012-10-01 NOTE — ED Notes (Signed)
TRANSPORTED TO CT SCAN.  

## 2012-10-01 NOTE — Progress Notes (Signed)
Patient seen and admitted earlier this AM.  Reportedly patient had LP for evaluation of CSF.  Neurosurgery on board and making recommendations.  Please refer to my associates H and P for further details.  Penny Pia  Have consulted nephrology for HD management given history of ESRD with dialysis on MWF.

## 2012-10-01 NOTE — Consult Note (Signed)
[name] 10/01/2012 [name] D Requesting Physician:  Dr. Cena Benton  Reason for Consult:  Need dialysis and related services  HPI: The patient is a 78 y.o. year-old WM w hx of syst HF, parkinson's dz, OSA, CAD w CABG 2003, s/p ICD implant, DM, HTN, HL and ESRD on hemodialysis. Admitted for worsening memory, worsening gait and debility. Concerned about progression of NPH. Underwent LP today with high filling pressures which were lowered with LP. Patient w/o complaints  ROS  no cp, sob or n/v/d  no fever, chills  no abd pain  no dysuria  no joint pain   no skin rash or itching  no focal weakness   Past Medical History:  Past Medical History  Diagnosis Date  . SLEEP APNEA, OBSTRUCTIVE     CPAP qhs  . VITAMIN B12 DEFICIENCY   . PARKINSON'S DISEASE   . CORONARY ARTERY DISEASE     s/p  CABG 2003-cath 12/31/09-occluded sap dx  graft, otherwise patent grafts  . Chronic systolic heart failure     LVEF 35-40% by cath 12/31/09 s/p BiV ICD 07/2010  . Gastroparesis   . CORONARY ARTERY BYPASS GRAFT, THREE VESSEL, HX OF 2003  . AUTOMATIC IMPLANTABLE CARDIAC DEFIBRILLATOR SITU 07/2010 BiV ICD  . ANEMIA, MACROCYTIC   . DEPRESSION   . DIABETES MELLITUS, TYPE I   . GERD   . HYPERCHOLESTEROLEMIA   . HYPERTENSION   . RENAL FAILURE, END STAGE     HD MWD a/w anemia of chronic dz, hypotension  . PERIPHERAL NEUROPATHY   . History of colonoscopy   . Renal insufficiency   . Cancer   . Collagen vascular disease   . CVA (cerebral vascular accident) 07/2011    L HP and dysphagia, improved  . Seizures     Past Surgical History:  Past Surgical History  Procedure Date  . Coronary artery bypass graft   . Cataract extraction, bilateral   . Pacemaker placement     permanent  . Dialysis fistula creation   . Shunt replacement   . Doppler echocardiography 2005, 2008, 2010, &2012    Family History:  Family History  Problem Relation Age of Onset  . Stroke Mother   . Diabetes Father   . Lung  cancer Father   . Diabetes Brother   . Stomach cancer Paternal Aunt   . Colon cancer Neg Hx    Social History:  reports that he has never smoked. He has never used smokeless tobacco. He reports that he does not drink alcohol or use illicit drugs.  Allergies:  Allergies  Allergen Reactions  . Nifedipine     Unknown  . Tape     Unknown    Home medications: Prior to Admission medications   Medication Sig Start Date End Date Taking? Authorizing Provider  acetaminophen (TYLENOL) 325 MG tablet Take 325 mg by mouth every 4 (four) hours as needed. For pain   Yes Historical Provider, MD  aspirin 81 MG tablet Take 81 mg by mouth daily.     Yes Historical Provider, MD  atorvastatin (LIPITOR) 20 MG tablet Take 20 mg by mouth daily.     Yes Historical Provider, MD  calcium acetate (PHOSLO) 667 MG capsule 3 (three) times daily. 1 capsule at dinner   Yes Historical Provider, MD  cholecalciferol (VITAMIN D) 400 UNITS TABS Take 1,000 Units by mouth.   Yes Historical Provider, MD  citalopram (CELEXA) 20 MG tablet Take 20 mg by mouth daily.  Yes Historical Provider, MD  docusate sodium (COLACE) 100 MG capsule Take 100 mg by mouth 2 (two) times daily as needed. For constipation   Yes Historical Provider, MD  glucose blood (TRUETEST TEST) test strip Use as instructed to check blood sugar four times a day dx 250.41 08/20/12 08/20/13 Yes Romero Belling, MD  hydrOXYzine (ATARAX/VISTARIL) 25 MG tablet Take AM of dialysis and every 6 hours as needed for itch symptoms 02/29/12  Yes Newt Lukes, MD  insulin lispro (HUMALOG) 100 UNIT/ML injection Inject 10-35 Units into the skin See admin instructions. 4x a day (just before each meal) 10-03-34-15 units (the 15 units is if you eat a bedtime snack). 12/12/11  Yes Romero Belling, MD  insulin NPH (HUMULIN N PEN) 100 UNIT/ML injection Inject 45 Units into the skin at bedtime. 12/12/11  Yes Romero Belling, MD  Insulin Pen Needle (B-D ULTRAFINE III SHORT PEN) 31G X 8 MM  MISC Use as directed five times daily 12/12/11  Yes Romero Belling, MD  isosorbide mononitrate (IMDUR) 60 MG 24 hr tablet Take 60 mg by mouth daily.     Yes Historical Provider, MD  Lancet Devices (RA LANCING DEVICE) MISC 1 Device by Does not apply route once. 08/20/12  Yes Romero Belling, MD  Lancets 28G MISC Use as directed to check blood sugar four times daily dx 250.41 08/20/12  Yes Romero Belling, MD  levETIRAcetam (KEPPRA) 250 MG tablet Take 1 tablet (250 mg total) by mouth every 12 (twelve) hours. take in addition to 500mg  bid of Keppra. 06/25/12 06/25/13 Yes Milas Gain, MD  levETIRAcetam (KEPPRA) 500 MG tablet take 1 in the a.m, 1 at night and 1 after every dialysis session 11/23/11  Yes Milas Gain, MD  metoprolol succinate (TOPROL-XL) 50 MG 24 hr tablet Take one and one-half tabs by mouth once daily 01/15/12  Yes Marinus Maw, MD  multivitamin (RENA-VIT) TABS tablet Take 1 tablet by mouth daily.     Yes Historical Provider, MD  NEXIUM 40 MG capsule TAKE ONE CAPSULE BY MOUTH TWICE DAILY 08/19/12  Yes Mardella Layman, MD  nitroGLYCERIN (NITROSTAT) 0.4 MG SL tablet Place 0.4 mg under the tongue every 5 (five) minutes as needed. For chest pain   Yes Historical Provider, MD  NON FORMULARY CPAP  For obstructive sleep apnea    Yes Historical Provider, MD  NON FORMULARY Dialysis  On Mon Wed Fri epogen 3800units,  infed 50mg ,  zemplar 2 mcg   Yes Historical Provider, MD  Nutritional Supplements (NEPRO) LIQD Take 1 Can by mouth at bedtime.    Yes Historical Provider, MD  ondansetron (ZOFRAN) 4 MG tablet Take 1 tablet (4 mg total) by mouth 2 (two) times daily as needed for nausea. 02/29/12  Yes Newt Lukes, MD  oxycodone (OXY-IR) 5 MG capsule Take 5 mg by mouth every 4 (four) hours as needed. For pain   Yes Historical Provider, MD  prasugrel (EFFIENT) 10 MG TABS Take 1 tablet (10 mg total) by mouth daily. 03/13/12  Yes Vesta Mixer, MD  promethazine (PHENERGAN) 12.5 MG tablet Take 12.5 mg by  mouth every 4 (four) hours as needed. For nausea   Yes Historical Provider, MD  ranolazine (RANEXA) 500 MG 12 hr tablet Take 500 mg by mouth every evening.  02/12/12  Yes Marinus Maw, MD  traMADol (ULTRAM) 50 MG tablet Take 50 mg by mouth 2 (two) times daily.  04/06/11  Yes Historical Provider, MD  zolpidem (AMBIEN) 5  MG tablet Take 5 mg by mouth at bedtime as needed. For insomnia   Yes Historical Provider, MD  glucagon (GLUCAGON EMERGENCY) 1 MG injection Use as directed     Historical Provider, MD    Inpatient medications:    . atorvastatin  20 mg Oral Daily  . citalopram  20 mg Oral Daily  . insulin aspart  0-15 Units Subcutaneous TID WC  . isosorbide mononitrate  60 mg Oral Daily  . levETIRAcetam  250 mg Oral Q12H  . ondansetron (ZOFRAN) IV  4 mg Intravenous Once  . prasugrel  10 mg Oral Daily  . ranolazine  500 mg Oral QPM  . sodium chloride  3 mL Intravenous Q12H     Labs: Basic Metabolic Panel:  Lab 10/01/12 4098 09/30/12 1803  NA 137 137  K 4.2 3.7  CL 95* 92*  CO2 25 22  GLUCOSE 186* 184*  BUN 47* 37*  CREATININE 8.83* 6.99*  ALB -- --  CALCIUM 9.7 10.1  PHOS -- --   Liver Function Tests:  Lab 09/30/12 1803  AST 18  ALT 17  ALKPHOS 26*  BILITOT 0.7  PROT 8.1  ALBUMIN 4.3   No results found for this basename: LIPASE:3,AMYLASE:3 in the last 168 hours No results found for this basename: AMMONIA:3 in the last 168 hours CBC:  Lab 10/01/12 0526 09/30/12 1803  WBC 4.4 12.7*  NEUTROABS -- 10.4*  HGB 11.1* 12.3*  HCT 31.3* 34.9*  MCV 91.5 90.6  PLT 148* 193   PT/INR: @labrcntip (inr:5) Cardiac Enzymes:  Lab 09/30/12 2154  CKTOTAL --  CKMB --  CKMBINDEX --  TROPONINI <0.30   CBG:  Lab 10/01/12 1206 10/01/12 0804 10/01/12 0206  GLUCAP 169* 171* 171*    Iron Studies: No results found for this basename: IRON:30,TIBC:30,TRANSFERRIN:30,FERRITIN:30 in the last 168 hours  Xrays/Other Studies: Dg Chest 2 View  09/30/2012  *RADIOLOGY REPORT*   Clinical Data: Cough  CHEST - 2 VIEW  Comparison: 08/14/2011  Findings: Right subclavian AICD device and leads are stable.  Clear lungs.  Normal heart size.  No pleural effusion.  No pneumothorax. VP shunt extends across the right hemithorax and neck.  IMPRESSION: No active cardiopulmonary disease.   Original Report Authenticated By: Donavan Burnet, M.D.    Dg Abd 1 View  09/30/2012  *RADIOLOGY REPORT*  Clinical Data: VP shunt, no abdominal complaints  ABDOMEN - 1 VIEW  Comparison: 08/16/2011;  Findings:  Ventriculoperitoneal catheter tubing overlies the right lower abdominal quadrant. Note, the mid aspect of the catheter tubing overlying the peripheral right lateral aspect of abdomen is excluded from view.  Paucity of bowel gas without definite evidence of obstruction.  No definite abnormal intra-abdominal calcifications.  Lumbar and bilateral hip degenerative change.  IMPRESSION: 1.  Ventriculoperitoneal catheter tubing overlies the right mid hemiabdomen, though note, the mid aspect of the catheter tubing is excluded from view. 2.  Paucity of bowel gas without definite evidence of obstruction.   Original Report Authenticated By: Waynard Reeds, M.D.    Ct Head Wo Contrast  10/01/2012  *RADIOLOGY REPORT*  Clinical Data: 78 year old male with lethargy.  Dialysis. Weakness.  VP shunt.  CT HEAD WITHOUT CONTRAST  Technique:  Contiguous axial images were obtained from the base of the skull through the vertex without contrast.  Comparison: 07/02/2012 and earlier.  Findings: Visualized paranasal sinuses and mastoids are clear. Stable visualized osseous structures.  Stable scalp soft tissues with posterior convexity shunt and reservoir.  Stable orbit soft  tissues.  Calcified atherosclerosis at the skull base.  Stable right parietal approach ventriculostomy catheter terminating just across midline at the level of the septum pellucidum.  Stable ventriculomegaly. No transependymal edema is evident. No midline shift,  mass effect, or evidence of mass lesion.  No acute intracranial hemorrhage identified.  Stable gray-white matter differentiation throughout the brain.  No evidence of cortically based acute infarction identified.  No suspicious intracranial vascular hyperdensity.  IMPRESSION: Stable ventriculomegaly, ventricular shunt, and noncontrast CT appearance of the brain.   Original Report Authenticated By: Harley Hallmark, M.D.     Physical Exam:  Blood pressure 139/68, pulse 90, temperature 97.6 F (36.4 C), temperature source Oral, resp. rate 17, height 6\' 1"  (1.854 m), weight 86 kg (189 lb 9.5 oz), SpO2 100.00%.  Gen: Elderly WM, a bit stiff, alert and responsive, not in distress Skin: no rash, cyanosis HEENT:  EOMI, sclera anicteric, throat clear Neck: no JVD, no bruits or LAN Chest: clear bilat, no rales or rhonchi Heart: regular, no rub or gallop, soft SEM Abdomen: soft, obese, nontender, no ascites or HSM Ext: no LE edema, L arm AVF + bruit Neuro: alert, Ox3, no focal deficit, no asterixis, strength 4+/5 in LE's 4+/5 in UE's  Outpatient HD: MWF at [facility], 4 hrs. 2K/2.5Ca bath, L arm AVF. 15 ga needles. Qb 450, A1.5. EDW is 88.5kg. Heparin 6,000u. Zemplar 1ug. No EPO (Hb 12.9).    Impression/Plan 1. Progressive FTT with worsening memory, gait and debility. R/O NPH as cause. Per primary. 2. ESRD, cont hd mwf.  3. HTN/volume- no gross vol excess. On Toprol-XL 50/d, continue. 4. Hx CAD/CABG/ICD- on Ranexa, BB, nitrates 5. Parkinson's disease 6. DM- per primary 7. HPTH- cont vit D, get outpt records 8. Anemia of CKD- no EPO, last Hb was 12.9  Will follow.  Vinson Moselle  MD [employer] [phone] pgr    [phone] cell 10/01/2012, 3:24 PM

## 2012-10-01 NOTE — Consult Note (Signed)
Reason for Consult: Questionable shunt malfunction in patient with deteriorating level of function Referring Physician: Dr. Mariane Masters [name] is an 78 y.o. male.  HPI: Patient is a 78 year old right-handed individual who a little over a year ago had a ventriculoperitoneal shunt placed or presumed diagnosis of normal pressure hydrocephalus. The surgery was done by Dr. Jillyn Hidden cram. The patient tolerated the procedure well and he had only some modest improvement in his overall function. In the past week to 2 weeks the patient's level of function appears to be deteriorating further he has become less responsive less interactive and appears to be having difficulties with activities of daily living. He is also experiencing worsening renal failure and is now been prepared for hemodialysis. Was concerned that he may be experiencing shunt malfunction.  A CT scan was performed on initial evaluation which demonstrates a the patient has ventriculomegaly much as he had previously even after placement of the shunt. The shunt catheter appears to be positioned well through a right parietal approach. There is no evidence of any subdural collections.  Past Medical History  Diagnosis Date  . SLEEP APNEA, OBSTRUCTIVE     CPAP qhs  . VITAMIN B12 DEFICIENCY   . PARKINSON'S DISEASE   . CORONARY ARTERY DISEASE     s/p  CABG 2003-cath 12/31/09-occluded sap dx  graft, otherwise patent grafts  . Chronic systolic heart failure     LVEF 35-40% by cath 12/31/09 s/p BiV ICD 07/2010  . Gastroparesis   . CORONARY ARTERY BYPASS GRAFT, THREE VESSEL, HX OF 2003  . AUTOMATIC IMPLANTABLE CARDIAC DEFIBRILLATOR SITU 07/2010 BiV ICD  . ANEMIA, MACROCYTIC   . DEPRESSION   . DIABETES MELLITUS, TYPE I   . GERD   . HYPERCHOLESTEROLEMIA   . HYPERTENSION   . RENAL FAILURE, END STAGE     HD MWD a/w anemia of chronic dz, hypotension  . PERIPHERAL NEUROPATHY   . History of colonoscopy   . Renal insufficiency   . Cancer   . Collagen  vascular disease   . CVA (cerebral vascular accident) 07/2011    L HP and dysphagia, improved  . Seizures     Past Surgical History  Procedure Date  . Coronary artery bypass graft   . Cataract extraction, bilateral   . Pacemaker placement     permanent  . Dialysis fistula creation   . Shunt replacement   . Doppler echocardiography 2005, 2008, 2010, &2012    Family History  Problem Relation Age of Onset  . Stroke Mother   . Diabetes Father   . Lung cancer Father   . Diabetes Brother   . Stomach cancer Paternal Aunt   . Colon cancer Neg Hx     Social History:  reports that he has never smoked. He has never used smokeless tobacco. He reports that he does not drink alcohol or use illicit drugs.  Allergies:  Allergies  Allergen Reactions  . Nifedipine     Unknown  . Tape     Unknown    Medications: Not reviewed  Results for orders placed during the hospital encounter of 09/30/12 (from the past 48 hour(s))  CBC WITH DIFFERENTIAL     Status: Abnormal   Collection Time   09/30/12  6:03 PM      Component Value Range Comment   WBC 12.7 (*) 4.0 - 10.5 K/uL    RBC 3.85 (*) 4.22 - 5.81 MIL/uL    Hemoglobin 12.3 (*) 13.0 - 17.0 g/dL  HCT 34.9 (*) 39.0 - 52.0 %    MCV 90.6  78.0 - 100.0 fL    MCH 31.9  26.0 - 34.0 pg    MCHC 35.2  30.0 - 36.0 g/dL    RDW 45.4  09.8 - 11.9 %    Platelets 193  150 - 400 K/uL    Neutrophils Relative 82 (*) 43 - 77 %    Neutro Abs 10.4 (*) 1.7 - 7.7 K/uL    Lymphocytes Relative 8 (*) 12 - 46 %    Lymphs Abs 1.1  0.7 - 4.0 K/uL    Monocytes Relative 9  3 - 12 %    Monocytes Absolute 1.2 (*) 0.1 - 1.0 K/uL    Eosinophils Relative 1  0 - 5 %    Eosinophils Absolute 0.1  0.0 - 0.7 K/uL    Basophils Relative 0  0 - 1 %    Basophils Absolute 0.0  0.0 - 0.1 K/uL   COMPREHENSIVE METABOLIC PANEL     Status: Abnormal   Collection Time   09/30/12  6:03 PM      Component Value Range Comment   Sodium 137  135 - 145 mEq/L    Potassium 3.7  3.5 -  5.1 mEq/L    Chloride 92 (*) 96 - 112 mEq/L    CO2 22  19 - 32 mEq/L    Glucose, Bld 184 (*) 70 - 99 mg/dL    BUN 37 (*) 6 - 23 mg/dL    Creatinine, Ser 1.47 (*) 0.50 - 1.35 mg/dL    Calcium 82.9  8.4 - 10.5 mg/dL    Total Protein 8.1  6.0 - 8.3 g/dL    Albumin 4.3  3.5 - 5.2 g/dL    AST 18  0 - 37 U/L    ALT 17  0 - 53 U/L    Alkaline Phosphatase 26 (*) 39 - 117 U/L    Total Bilirubin 0.7  0.3 - 1.2 mg/dL    GFR calc non Af Amer 7 (*) >90 mL/min    GFR calc Af Amer 8 (*) >90 mL/min   URINALYSIS, ROUTINE W REFLEX MICROSCOPIC     Status: Abnormal   Collection Time   09/30/12  9:43 PM      Component Value Range Comment   Color, Urine YELLOW  YELLOW    APPearance HAZY (*) CLEAR    Specific Gravity, Urine 1.017  1.005 - 1.030    pH 5.0  5.0 - 8.0    Glucose, UA 100 (*) NEGATIVE mg/dL    Hgb urine dipstick NEGATIVE  NEGATIVE    Bilirubin Urine SMALL (*) NEGATIVE    Ketones, ur NEGATIVE  NEGATIVE mg/dL    Protein, ur 30 (*) NEGATIVE mg/dL    Urobilinogen, UA 0.2  0.0 - 1.0 mg/dL    Nitrite NEGATIVE  NEGATIVE    Leukocytes, UA SMALL (*) NEGATIVE   URINE MICROSCOPIC-ADD ON     Status: Normal   Collection Time   09/30/12  9:43 PM      Component Value Range Comment   Squamous Epithelial / LPF RARE  RARE    WBC, UA 3-6  <3 WBC/hpf    RBC / HPF 0-2  <3 RBC/hpf    Bacteria, UA RARE  RARE    Urine-Other AMORPHOUS URATES/PHOSPHATES     OCCULT BLOOD, POC DEVICE     Status: Normal   Collection Time   09/30/12  9:45 PM  Component Value Range Comment   Fecal Occult Bld NEGATIVE     TROPONIN I     Status: Normal   Collection Time   09/30/12  9:54 PM      Component Value Range Comment   Troponin I <0.30  <0.30 ng/mL   GLUCOSE, CAPILLARY     Status: Abnormal   Collection Time   10/01/12  2:06 AM      Component Value Range Comment   Glucose-Capillary 171 (*) 70 - 99 mg/dL   MRSA PCR SCREENING     Status: Normal   Collection Time   10/01/12  4:37 AM      Component Value Range Comment     MRSA by PCR NEGATIVE  NEGATIVE   BASIC METABOLIC PANEL     Status: Abnormal   Collection Time   10/01/12  5:26 AM      Component Value Range Comment   Sodium 137  135 - 145 mEq/L    Potassium 4.2  3.5 - 5.1 mEq/L    Chloride 95 (*) 96 - 112 mEq/L    CO2 25  19 - 32 mEq/L    Glucose, Bld 186 (*) 70 - 99 mg/dL    BUN 47 (*) 6 - 23 mg/dL    Creatinine, Ser 1.61 (*) 0.50 - 1.35 mg/dL    Calcium 9.7  8.4 - 09.6 mg/dL    GFR calc non Af Amer 5 (*) >90 mL/min    GFR calc Af Amer 6 (*) >90 mL/min   CBC     Status: Abnormal   Collection Time   10/01/12  5:26 AM      Component Value Range Comment   WBC 4.4  4.0 - 10.5 K/uL    RBC 3.42 (*) 4.22 - 5.81 MIL/uL    Hemoglobin 11.1 (*) 13.0 - 17.0 g/dL    HCT 04.5 (*) 40.9 - 52.0 %    MCV 91.5  78.0 - 100.0 fL    MCH 32.5  26.0 - 34.0 pg    MCHC 35.5  30.0 - 36.0 g/dL    RDW 81.1  91.4 - 78.2 %    Platelets 148 (*) 150 - 400 K/uL DELTA CHECK NOTED  GLUCOSE, CAPILLARY     Status: Abnormal   Collection Time   10/01/12  8:04 AM      Component Value Range Comment   Glucose-Capillary 171 (*) 70 - 99 mg/dL   CRYPTOCOCCAL ANTIGEN, CSF     Status: Normal   Collection Time   10/01/12 10:45 AM      Component Value Range Comment   Crypto Ag NEGATIVE  NEGATIVE   PROTEIN AND GLUCOSE, CSF     Status: Abnormal   Collection Time   10/01/12 10:45 AM      Component Value Range Comment   Glucose, CSF 124 (*) 43 - 76 mg/dL    Total  Protein, CSF 69 (*) 15 - 45 mg/dL   GRAM STAIN     Status: Normal   Collection Time   10/01/12 10:48 AM      Component Value Range Comment   Specimen Description CSF      Special Requests NONE      Gram Stain        Value: CYTOSPIN SLIDE:     NO WBC SEEN     NO ORGANISMS SEEN   Report Status 10/01/2012 FINAL     CSF CELL COUNT WITH DIFFERENTIAL     Status: Abnormal  Collection Time   10/01/12 10:48 AM      Component Value Range Comment   Tube # 1      Color, CSF STRAW (*) COLORLESS    Appearance, CSF CLEAR  CLEAR     Supernatant XANTHOCHROMIC      RBC Count, CSF 194 (*) 0 /cu mm    WBC, CSF 0  0 - 5 /cu mm    Segmented Neutrophils-CSF RARE  0 - 6 %    Lymphs, CSF RARE  40 - 80 %    Monocyte-Macrophage-Spinal Fluid RARE  15 - 45 %    Eosinophils, CSF 0  0 - 1 %    Other Cells, CSF TOO FEW TO COUNT, SMEAR AVAILABLE FOR REVIEW     CSF CELL COUNT WITH DIFFERENTIAL     Status: Abnormal   Collection Time   10/01/12 10:50 AM      Component Value Range Comment   Tube # 4      Color, CSF STRAW (*) COLORLESS    Appearance, CSF CLEAR  CLEAR    Supernatant XANTHOCHROMIC      RBC Count, CSF 150 (*) 0 /cu mm    WBC, CSF 0  0 - 5 /cu mm    Segmented Neutrophils-CSF TOO FEW TO COUNT, SMEAR AVAILABLE FOR REVIEW  0 - 6 %    Lymphs, CSF RARE  40 - 80 %    Monocyte-Macrophage-Spinal Fluid RARE  15 - 45 %    Eosinophils, CSF 0  0 - 1 %    Other Cells, CSF 0     GLUCOSE, CAPILLARY     Status: Abnormal   Collection Time   10/01/12 12:06 PM      Component Value Range Comment   Glucose-Capillary 169 (*) 70 - 99 mg/dL     Dg Chest 2 View  40/08/8118  *RADIOLOGY REPORT*  Clinical Data: Cough  CHEST - 2 VIEW  Comparison: 08/14/2011  Findings: Right subclavian AICD device and leads are stable.  Clear lungs.  Normal heart size.  No pleural effusion.  No pneumothorax. VP shunt extends across the right hemithorax and neck.  IMPRESSION: No active cardiopulmonary disease.   Original Report Authenticated By: Donavan Burnet, M.D.    Dg Abd 1 View  09/30/2012  *RADIOLOGY REPORT*  Clinical Data: VP shunt, no abdominal complaints  ABDOMEN - 1 VIEW  Comparison: 08/16/2011;  Findings:  Ventriculoperitoneal catheter tubing overlies the right lower abdominal quadrant. Note, the mid aspect of the catheter tubing overlying the peripheral right lateral aspect of abdomen is excluded from view.  Paucity of bowel gas without definite evidence of obstruction.  No definite abnormal intra-abdominal calcifications.  Lumbar and bilateral hip  degenerative change.  IMPRESSION: 1.  Ventriculoperitoneal catheter tubing overlies the right mid hemiabdomen, though note, the mid aspect of the catheter tubing is excluded from view. 2.  Paucity of bowel gas without definite evidence of obstruction.   Original Report Authenticated By: Waynard Reeds, M.D.    Ct Head Wo Contrast  10/01/2012  *RADIOLOGY REPORT*  Clinical Data: 78 year old male with lethargy.  Dialysis. Weakness.  VP shunt.  CT HEAD WITHOUT CONTRAST  Technique:  Contiguous axial images were obtained from the base of the skull through the vertex without contrast.  Comparison: 07/02/2012 and earlier.  Findings: Visualized paranasal sinuses and mastoids are clear. Stable visualized osseous structures.  Stable scalp soft tissues with posterior convexity shunt and reservoir.  Stable orbit soft tissues.  Calcified atherosclerosis at the skull base.  Stable right parietal approach ventriculostomy catheter terminating just across midline at the level of the septum pellucidum.  Stable ventriculomegaly. No transependymal edema is evident. No midline shift, mass effect, or evidence of mass lesion.  No acute intracranial hemorrhage identified.  Stable gray-white matter differentiation throughout the brain.  No evidence of cortically based acute infarction identified.  No suspicious intracranial vascular hyperdensity.  IMPRESSION: Stable ventriculomegaly, ventricular shunt, and noncontrast CT appearance of the brain.   Original Report Authenticated By: Harley Hallmark, M.D.     Review of Systems  Unable to perform ROS: mental acuity   Blood pressure 139/68, pulse 90, temperature 97.6 F (36.4 C), temperature source Oral, resp. rate 17, height 6\' 1"  (1.854 m), weight 86 kg (189 lb 9.5 oz), SpO2 100.00%. Physical Exam  Constitutional: He appears well-developed and well-nourished.  HENT:  Head: Normocephalic and atraumatic.  Eyes: Conjunctivae normal are normal. Pupils are equal, round, and reactive  to light.  Neck: Normal range of motion. Neck supple.  Cardiovascular: Normal rate, regular rhythm and normal heart sounds.   Respiratory: Effort normal and breath sounds normal.  GI: Soft. Bowel sounds are normal.  Musculoskeletal: Normal range of motion.  Neurological:       Patient arouses to voice he is oriented to hospital but not the day or date. He understands that he has a shunt. The patient cooperates with all motor function but does not contribute constructively to verbal interaction.  Shunt pumps and refills briskly. The area is nontender on the scalp. The neck is supple. Cranial nerve examination is normal. Motor function appears to show good strength with normal tone and no evidence of cortical drift.    Assessment/Plan: Elderly individual with presumed diagnosis of normal pressure hydrocephalus now with a ventriculoperitoneal shunt. I've discussed the situation at some length with the son I suggested the best way to test the functioning of the shunt and whether there is a pressure stayed in the patient's head would be to do a simple lumbar puncture we would assess his opening pressure drain substantial spinal fluid which can be sent for various studies and assess his closing pressure and whether there is any clinical improvement in his function. My suspicion is that the shunt is functioning well and the patient's clinical deterioration is not related to any diagnosis of normal pressure hydrocephalus or its treatment.  Hani Patnode J 10/01/2012, 1:56 PM

## 2012-10-01 NOTE — H&P (Signed)
PCP:   Rene Paci, MD   Chief Complaint:  Trouble walking n/v  HPI: 78 yo male esrd hd dep mwf, h/o normopress hydroceph s/p vp shunt, cad, comes in with 1-2 weeks of worsening generalized weakness and falling.  deneis any fevers/cough.  No headache or vision changes.  Has been vomiting in the Ed.  No abd pain.  No cp.  No diarrhea.  Denies any focal neuro def.  Last time he had his vp shunt evaluated by nsx was over a year ago or so and was normal.  reciefved dialysis today without any problems.    Review of Systems:  O/w neg  Past Medical History: Past Medical History  Diagnosis Date  . SLEEP APNEA, OBSTRUCTIVE     CPAP qhs  . VITAMIN B12 DEFICIENCY   . PARKINSON'S DISEASE   . CORONARY ARTERY DISEASE     s/p  CABG 2003-cath 12/31/09-occluded sap dx  graft, otherwise patent grafts  . Chronic systolic heart failure     LVEF 35-40% by cath 12/31/09 s/p BiV ICD 07/2010  . Gastroparesis   . CORONARY ARTERY BYPASS GRAFT, THREE VESSEL, HX OF 2003  . AUTOMATIC IMPLANTABLE CARDIAC DEFIBRILLATOR SITU 07/2010 BiV ICD  . ANEMIA, MACROCYTIC   . DEPRESSION   . DIABETES MELLITUS, TYPE I   . GERD   . HYPERCHOLESTEROLEMIA   . HYPERTENSION   . RENAL FAILURE, END STAGE     HD MWD a/w anemia of chronic dz, hypotension  . PERIPHERAL NEUROPATHY   . History of colonoscopy   . Renal insufficiency   . Cancer   . Collagen vascular disease   . CVA (cerebral vascular accident) 07/2011    L HP and dysphagia, improved  . Seizures    Past Surgical History  Procedure Date  . Coronary artery bypass graft   . Cataract extraction, bilateral   . Pacemaker placement     permanent  . Dialysis fistula creation   . Shunt replacement   . Doppler echocardiography 2005, 2008, 2010, &2012    Medications: Prior to Admission medications   Medication Sig Start Date End Date Taking? Authorizing Provider  acetaminophen (TYLENOL) 325 MG tablet Take 325 mg by mouth every 4 (four) hours as needed. For pain    Yes Historical Provider, MD  aspirin 81 MG tablet Take 81 mg by mouth daily.     Yes Historical Provider, MD  atorvastatin (LIPITOR) 20 MG tablet Take 20 mg by mouth daily.     Yes Historical Provider, MD  calcium acetate (PHOSLO) 667 MG capsule 3 (three) times daily. 1 capsule at dinner   Yes Historical Provider, MD  cholecalciferol (VITAMIN D) 400 UNITS TABS Take 1,000 Units by mouth.   Yes Historical Provider, MD  citalopram (CELEXA) 20 MG tablet Take 20 mg by mouth daily.     Yes Historical Provider, MD  docusate sodium (COLACE) 100 MG capsule Take 100 mg by mouth 2 (two) times daily as needed. For constipation   Yes Historical Provider, MD  glucose blood (TRUETEST TEST) test strip Use as instructed to check blood sugar four times a day dx 250.41 08/20/12 08/20/13 Yes Romero Belling, MD  hydrOXYzine (ATARAX/VISTARIL) 25 MG tablet Take AM of dialysis and every 6 hours as needed for itch symptoms 02/29/12  Yes Newt Lukes, MD  insulin lispro (HUMALOG) 100 UNIT/ML injection Inject 10-35 Units into the skin See admin instructions. 4x a day (just before each meal) 10-03-34-15 units (the 15 units is if you  eat a bedtime snack). 12/12/11  Yes Romero Belling, MD  insulin NPH (HUMULIN N PEN) 100 UNIT/ML injection Inject 45 Units into the skin at bedtime. 12/12/11  Yes Romero Belling, MD  Insulin Pen Needle (B-D ULTRAFINE III SHORT PEN) 31G X 8 MM MISC Use as directed five times daily 12/12/11  Yes Romero Belling, MD  isosorbide mononitrate (IMDUR) 60 MG 24 hr tablet Take 60 mg by mouth daily.     Yes Historical Provider, MD  Lancet Devices (RA LANCING DEVICE) MISC 1 Device by Does not apply route once. 08/20/12  Yes Romero Belling, MD  Lancets 28G MISC Use as directed to check blood sugar four times daily dx 250.41 08/20/12  Yes Romero Belling, MD  levETIRAcetam (KEPPRA) 250 MG tablet Take 1 tablet (250 mg total) by mouth every 12 (twelve) hours. take in addition to 500mg  bid of Keppra. 06/25/12 06/25/13 Yes Milas Gain, MD  levETIRAcetam (KEPPRA) 500 MG tablet take 1 in the a.m, 1 at night and 1 after every dialysis session 11/23/11  Yes Milas Gain, MD  metoprolol succinate (TOPROL-XL) 50 MG 24 hr tablet Take one and one-half tabs by mouth once daily 01/15/12  Yes Marinus Maw, MD  multivitamin (RENA-VIT) TABS tablet Take 1 tablet by mouth daily.     Yes Historical Provider, MD  NEXIUM 40 MG capsule TAKE ONE CAPSULE BY MOUTH TWICE DAILY 08/19/12  Yes Mardella Layman, MD  nitroGLYCERIN (NITROSTAT) 0.4 MG SL tablet Place 0.4 mg under the tongue every 5 (five) minutes as needed. For chest pain   Yes Historical Provider, MD  NON FORMULARY CPAP  For obstructive sleep apnea    Yes Historical Provider, MD  NON FORMULARY Dialysis  On Mon Wed Fri epogen 3800units,  infed 50mg ,  zemplar 2 mcg   Yes Historical Provider, MD  Nutritional Supplements (NEPRO) LIQD Take 1 Can by mouth at bedtime.    Yes Historical Provider, MD  ondansetron (ZOFRAN) 4 MG tablet Take 1 tablet (4 mg total) by mouth 2 (two) times daily as needed for nausea. 02/29/12  Yes Newt Lukes, MD  oxycodone (OXY-IR) 5 MG capsule Take 5 mg by mouth every 4 (four) hours as needed. For pain   Yes Historical Provider, MD  prasugrel (EFFIENT) 10 MG TABS Take 1 tablet (10 mg total) by mouth daily. 03/13/12  Yes Vesta Mixer, MD  promethazine (PHENERGAN) 12.5 MG tablet Take 12.5 mg by mouth every 4 (four) hours as needed. For nausea   Yes Historical Provider, MD  ranolazine (RANEXA) 500 MG 12 hr tablet Take 500 mg by mouth every evening.  02/12/12  Yes Marinus Maw, MD  traMADol (ULTRAM) 50 MG tablet Take 50 mg by mouth 2 (two) times daily.  04/06/11  Yes Historical Provider, MD  zolpidem (AMBIEN) 5 MG tablet Take 5 mg by mouth at bedtime as needed. For insomnia   Yes Historical Provider, MD  glucagon (GLUCAGON EMERGENCY) 1 MG injection Use as directed     Historical Provider, MD    Allergies:   Allergies  Allergen Reactions  . Nifedipine      Unknown  . Tape     Unknown    Social History:  reports that he has never smoked. He has never used smokeless tobacco. He reports that he does not drink alcohol or use illicit drugs.  Family History: Family History  Problem Relation Age of Onset  . Stroke Mother   . Diabetes Father   .  Lung cancer Father   . Diabetes Brother   . Stomach cancer Paternal Aunt   . Colon cancer Neg Hx     Physical Exam: Filed Vitals:   09/30/12 2015 09/30/12 2039 09/30/12 2258 09/30/12 2315  BP:  132/66 155/72 127/64  Pulse:  100 99 97  Temp: 98.9 F (37.2 C) 98.3 F (36.8 C)    TempSrc:  Oral    Resp:  16 16 17   SpO2:  100% 100% 95%   General appearance: alert, cooperative and no distress Lungs: clear to auscultation bilaterally Heart: regular rate and rhythm, S1, S2 normal, no murmur, click, rub or gallop Abdomen: soft, non-tender; bowel sounds normal; no masses,  no organomegaly Extremities: extremities normal, atraumatic, no cyanosis or edema Pulses: 2+ and symmetric Skin: Skin color, texture, turgor normal. No rashes or lesions Neurologic: Grossly normal    Labs on Admission:   [facility] 09/30/12 1803  NA 137  K 3.7  CL 92*  CO2 22  GLUCOSE 184*  BUN 37*  CREATININE 6.99*  CALCIUM 10.1  MG --  PHOS --    Basename 09/30/12 1803  AST 18  ALT 17  ALKPHOS 26*  BILITOT 0.7  PROT 8.1  ALBUMIN 4.3    Basename 09/30/12 1803  WBC 12.7*  NEUTROABS 10.4*  HGB 12.3*  HCT 34.9*  MCV 90.6  PLT 193    Basename 09/30/12 2154  CKTOTAL --  CKMB --  CKMBINDEX --  TROPONINI <0.30   Radiological Exams on Admission: Dg Chest 2 View  09/30/2012  *RADIOLOGY REPORT*  Clinical Data: Cough  CHEST - 2 VIEW  Comparison: 08/14/2011  Findings: Right subclavian AICD device and leads are stable.  Clear lungs.  Normal heart size.  No pleural effusion.  No pneumothorax. VP shunt extends across the right hemithorax and neck.  IMPRESSION: No active cardiopulmonary disease.    Original Report Authenticated By: Donavan Burnet, M.D.    Dg Abd 1 View  09/30/2012  *RADIOLOGY REPORT*  Clinical Data: VP shunt, no abdominal complaints  ABDOMEN - 1 VIEW  Comparison: 08/16/2011;  Findings:  Ventriculoperitoneal catheter tubing overlies the right lower abdominal quadrant. Note, the mid aspect of the catheter tubing overlying the peripheral right lateral aspect of abdomen is excluded from view.  Paucity of bowel gas without definite evidence of obstruction.  No definite abnormal intra-abdominal calcifications.  Lumbar and bilateral hip degenerative change.  IMPRESSION: 1.  Ventriculoperitoneal catheter tubing overlies the right mid hemiabdomen, though note, the mid aspect of the catheter tubing is excluded from view. 2.  Paucity of bowel gas without definite evidence of obstruction.   Original Report Authenticated By: Waynard Reeds, M.D.    Ct Head Wo Contrast  10/01/2012  *RADIOLOGY REPORT*  Clinical Data: 78 year old male with lethargy.  Dialysis. Weakness.  VP shunt.  CT HEAD WITHOUT CONTRAST  Technique:  Contiguous axial images were obtained from the base of the skull through the vertex without contrast.  Comparison: 07/02/2012 and earlier.  Findings: Visualized paranasal sinuses and mastoids are clear. Stable visualized osseous structures.  Stable scalp soft tissues with posterior convexity shunt and reservoir.  Stable orbit soft tissues.  Calcified atherosclerosis at the skull base.  Stable right parietal approach ventriculostomy catheter terminating just across midline at the level of the septum pellucidum.  Stable ventriculomegaly. No transependymal edema is evident. No midline shift, mass effect, or evidence of mass lesion.  No acute intracranial hemorrhage identified.  Stable gray-white matter differentiation throughout the brain.  No evidence of cortically based acute infarction identified.  No suspicious intracranial vascular hyperdensity.  IMPRESSION: Stable ventriculomegaly,  ventricular shunt, and noncontrast CT appearance of the brain.   Original Report Authenticated By: Harley Hallmark, M.D.     Assessment/Plan Present on Admission:  78 yo male with gait abnormality, general weakness and n/v with VP shunt and mult other medical issues .Nausea and vomiting .Gait abnormality .Lethargy .Chronic systolic heart failure .PARKINSON'S DISEASE .RENAL FAILURE, END STAGE .ICD-[employer] .Seizure disorder .Normal pressure hydrocephalus  Unclear what is causing his symptoms.  Admit to have VP shunt evaluated by neurosurg who has already been called by EDP.  No fevers.  No source of infection.  Not uremic.  chf compensated.  Obs.  No obvious active seizures??  No focal neuro deficits.  Monitor closely.  [facility] neurosx recs.  Abdulhadi Stopa A [phone] 10/01/2012, 1:16 AM

## 2012-10-02 ENCOUNTER — Inpatient Hospital Stay (HOSPITAL_COMMUNITY): Payer: Medicare Other

## 2012-10-02 DIAGNOSIS — G912 (Idiopathic) normal pressure hydrocephalus: Secondary | ICD-10-CM

## 2012-10-02 DIAGNOSIS — E1029 Type 1 diabetes mellitus with other diabetic kidney complication: Secondary | ICD-10-CM

## 2012-10-02 DIAGNOSIS — R262 Difficulty in walking, not elsewhere classified: Secondary | ICD-10-CM

## 2012-10-02 LAB — VITAMIN B12: Vitamin B-12: 934 pg/mL — ABNORMAL HIGH (ref 211–911)

## 2012-10-02 LAB — GLUCOSE, CAPILLARY
Glucose-Capillary: 162 mg/dL — ABNORMAL HIGH (ref 70–99)
Glucose-Capillary: 268 mg/dL — ABNORMAL HIGH (ref 70–99)

## 2012-10-02 LAB — FOLATE: Folate: >20 ng/mL

## 2012-10-02 LAB — HEPATITIS B SURFACE ANTIGEN: Hepatitis B Surface Ag: NEGATIVE

## 2012-10-02 NOTE — Progress Notes (Signed)
Patient is currently active with [facility] Care Management Services.  Patient has adequate home support for home discharge.  Medical POA and advanced directives designations are needed.  We will resume RN [facility] Coordination services upon discharge. Continued focus will be placed on disease process management and long range plans for care management of his chronic conditions.  For any additional questions or new referrals please contact Anibal Henderson BSN RN [facility] Liaison at [phone].

## 2012-10-02 NOTE — Progress Notes (Addendum)
[facility] PROGRESS NOTE  [name] ZOX:[id number] DOB: November 30, 1933 DOA: 09/30/2012 PCP: Rene Paci, MD  Assessment/Plan: 1. Gait abnormality - work up pending and will order vitamin B 12, folate, ammonia, rpr drawn and recently negative - Not felt to be secondary to neurological process per neurosurgery's evaluation - May be secondary to deconditioning as patient "lays around a lot" at home.  Will order an evaluation by physical therapy for tomorrow.  2.  Normal pressure hydrocephalus - Per neurosurgery's note: A CT scan was performed on initial evaluation which demonstrates a the patient has ventriculomegaly much as he had previously even after placement of the shunt. The shunt catheter appears to be positioned well through a right parietal approach. There is no evidence of any subdural collections - Had normal opening pressure of 90mm on LP  3. Parkinsons disease - May be contributing to failure to thrive and worsening gait.  Would have patient likely follow up with neurology as outpatient for further evaluation and recommendations.  4. Nausea and vomiting - Resolved and likely due to reaction after hemodialysis. Currently patient on Diabetic diet and there is no reports of nausea and emesis today.  Ate almost 50% of meals.  5. ESRD on HD - Nephrology on board and we will f/u with their recommendations.  Thank you for your help with this patient.   6. Addendum: DM: Place on diabetic diet with routine cbg monitoring and place on SSI.  Will adjust medications pending blood sugar levels.  Code Status: Presumed full code at this juncture, will clarify next am Family Communication: Spoke with patient and wife at bedside Disposition Plan: Pending Physical therapy evaluation.  Likely d/c home in 1-2 days.  Patient will need neurological evaluation as outpatient for parkinsons  Consultants:  Neurosurgery: Dr. Danielle Dess  Nephrology Dr.  Arlean Hopping  Procedures:  LP  Antibiotics:  None  HPI/Subjective: Pt's only complaint today is that his back hurts.  Otherwise denies any HA's, fevers, blurred vision, chills, nausea, emesis.  Objective: Filed Vitals:   10/02/12 1109 10/02/12 1200 10/02/12 1300 10/02/12 1623  BP: 146/82 139/67 154/63 122/69  Pulse: 102 100 108 101  Temp: 97.1 F (36.2 C)   97.4 F (36.3 C)  TempSrc: Oral   Oral  Resp: 17 16 18 14   Height:      Weight: 87 kg (191 lb 12.8 oz)     SpO2: 99% 100% 99% 94%    Intake/Output Summary (Last 24 hours) at 10/02/12 1703 Last data filed at 10/02/12 1200  Gross per 24 hour  Intake    480 ml  Output      0 ml  Net    480 ml   Filed Weights   10/02/12 0507 10/02/12 0700 10/02/12 1109  Weight: 86.864 kg (191 lb 8 oz) 87 kg (191 lb 12.8 oz) 87 kg (191 lb 12.8 oz)    Exam:   General:  Pt in NAD, Alert and Awake  Cardiovascular: RRR, No MRG  Respiratory: CTA BL, no wheezes  Abdomen: Soft, NT  Neuro: Answers questions appropriately, moves all extremities  Data Reviewed: Basic Metabolic Panel:  Lab 10/01/12 8119 09/30/12 1803  NA 137 137  K 4.2 3.7  CL 95* 92*  CO2 25 22  GLUCOSE 186* 184*  BUN 47* 37*  CREATININE 8.83* 6.99*  CALCIUM 9.7 10.1  MG -- --  PHOS -- --   Liver Function Tests:  Lab 09/30/12 1803  AST 18  ALT 17  ALKPHOS 26*  BILITOT 0.7  PROT 8.1  ALBUMIN 4.3   No results found for this basename: LIPASE:5,AMYLASE:5 in the last 168 hours  Lab 10/02/12 1312  AMMONIA 15   CBC:  Lab 10/01/12 0526 09/30/12 1803  WBC 4.4 12.7*  NEUTROABS -- 10.4*  HGB 11.1* 12.3*  HCT 31.3* 34.9*  MCV 91.5 90.6  PLT 148* 193   Cardiac Enzymes:  Lab 09/30/12 2154  CKTOTAL --  CKMB --  CKMBINDEX --  TROPONINI <0.30   BNP (last 3 results) No results found for this basename: PROBNP:3 in the last 8760 hours CBG:  Lab 10/02/12 1211 10/01/12 2227 10/01/12 1206 10/01/12 0804 10/01/12 0206  GLUCAP 162* 325* 169* 171* 171*     Recent Results (from the past 240 hour(s))  MRSA PCR SCREENING     Status: Normal   Collection Time   10/01/12  4:37 AM      Component Value Range Status Comment   MRSA by PCR NEGATIVE  NEGATIVE Final   BODY FLUID CULTURE     Status: Normal (Preliminary result)   Collection Time   10/01/12 10:44 AM      Component Value Range Status Comment   Specimen Description CSF   Final    Special Requests Normal   Final    Gram Stain     Final    Value: NO WBC SEEN     NO ORGANISMS SEEN     Performed at [facility]   Culture NO GROWTH 1 DAY   Final    Report Status PENDING   Incomplete   GRAM STAIN     Status: Normal   Collection Time   10/01/12 10:48 AM      Component Value Range Status Comment   Specimen Description CSF   Final    Special Requests NONE   Final    Gram Stain     Final    Value: CYTOSPIN SLIDE:     NO WBC SEEN     NO ORGANISMS SEEN   Report Status 10/01/2012 FINAL   Final      Studies: Dg Chest 2 View  09/30/2012  *RADIOLOGY REPORT*  Clinical Data: Cough  CHEST - 2 VIEW  Comparison: 08/14/2011  Findings: Right subclavian AICD device and leads are stable.  Clear lungs.  Normal heart size.  No pleural effusion.  No pneumothorax. VP shunt extends across the right hemithorax and neck.  IMPRESSION: No active cardiopulmonary disease.   Original Report Authenticated By: Donavan Burnet, M.D.    Dg Abd 1 View  09/30/2012  *RADIOLOGY REPORT*  Clinical Data: VP shunt, no abdominal complaints  ABDOMEN - 1 VIEW  Comparison: 08/16/2011;  Findings:  Ventriculoperitoneal catheter tubing overlies the right lower abdominal quadrant. Note, the mid aspect of the catheter tubing overlying the peripheral right lateral aspect of abdomen is excluded from view.  Paucity of bowel gas without definite evidence of obstruction.  No definite abnormal intra-abdominal calcifications.  Lumbar and bilateral hip degenerative change.  IMPRESSION: 1.  Ventriculoperitoneal catheter tubing overlies the  right mid hemiabdomen, though note, the mid aspect of the catheter tubing is excluded from view. 2.  Paucity of bowel gas without definite evidence of obstruction.   Original Report Authenticated By: Waynard Reeds, M.D.    Ct Head Wo Contrast  10/01/2012  *RADIOLOGY REPORT*  Clinical Data: 78 year old male with lethargy.  Dialysis. Weakness.  VP shunt.  CT HEAD WITHOUT CONTRAST  Technique:  Contiguous axial images were  obtained from the base of the skull through the vertex without contrast.  Comparison: 07/02/2012 and earlier.  Findings: Visualized paranasal sinuses and mastoids are clear. Stable visualized osseous structures.  Stable scalp soft tissues with posterior convexity shunt and reservoir.  Stable orbit soft tissues.  Calcified atherosclerosis at the skull base.  Stable right parietal approach ventriculostomy catheter terminating just across midline at the level of the septum pellucidum.  Stable ventriculomegaly. No transependymal edema is evident. No midline shift, mass effect, or evidence of mass lesion.  No acute intracranial hemorrhage identified.  Stable gray-white matter differentiation throughout the brain.  No evidence of cortically based acute infarction identified.  No suspicious intracranial vascular hyperdensity.  IMPRESSION: Stable ventriculomegaly, ventricular shunt, and noncontrast CT appearance of the brain.   Original Report Authenticated By: Harley Hallmark, M.D.     Scheduled Meds:   . atorvastatin  20 mg Oral Daily  . citalopram  20 mg Oral Daily  . insulin aspart  0-15 Units Subcutaneous TID WC  . isosorbide mononitrate  60 mg Oral Daily  . levETIRAcetam  250 mg Oral Q12H  . paricalcitol  1 mcg Oral 3 times weekly  . prasugrel  10 mg Oral Daily  . ranolazine  500 mg Oral QPM  . sodium chloride  3 mL Intravenous Q12H   Continuous Infusions:   Principal Problem:  *Gait abnormality Active Problems:  PARKINSON'S DISEASE  Chronic systolic heart failure  RENAL  FAILURE, END STAGE  ICD-[employer]  Seizure disorder  Nausea and vomiting  Lethargy  S/P VP shunt  Normal pressure hydrocephalus    Time spent: > 35 minutes    Penny Pia  [facility] Pager [phone]. If 8PM-8AM, please contact night-coverage at [email or web address], password [facility] 10/02/2012, 5:03 PM  LOS: 2 days

## 2012-10-02 NOTE — Progress Notes (Signed)
I was present at this dialysis session. I have reviewed the session itself and made appropriate changes.   Vinson Moselle, MD [employer] 10/02/2012, 8:30 AM

## 2012-10-02 NOTE — Progress Notes (Signed)
Subjective: NO complaints   Objective Vital signs in last 24 hours: Filed Vitals:   10/02/12 0700 10/02/12 0707 10/02/12 0730 10/02/12 0800  BP: 185/82 149/85 133/73 143/76  Pulse: 96 88 92 94  Temp: 97.8 F (36.6 C)     TempSrc: Oral     Resp: 15 16 15 16   Height:      Weight: 87 kg (191 lb 12.8 oz)     SpO2: 99%      Weight change: 2.9 kg (6 lb 6.3 oz)  Intake/Output Summary (Last 24 hours) at 10/02/12 0831 Last data filed at 10/02/12 0510  Gross per 24 hour  Intake    440 ml  Output      0 ml  Net    440 ml   Labs: Basic Metabolic Panel:  Lab 10/01/12 1610 09/30/12 1803  NA 137 137  K 4.2 3.7  CL 95* 92*  CO2 25 22  GLUCOSE 186* 184*  BUN 47* 37*  CREATININE 8.83* 6.99*  ALB -- --  CALCIUM 9.7 10.1  PHOS -- --   Liver Function Tests:  Lab 09/30/12 1803  AST 18  ALT 17  ALKPHOS 26*  BILITOT 0.7  PROT 8.1  ALBUMIN 4.3   No results found for this basename: LIPASE:3,AMYLASE:3 in the last 168 hours No results found for this basename: AMMONIA:3 in the last 168 hours CBC:  Lab 10/01/12 0526 09/30/12 1803  WBC 4.4 12.7*  NEUTROABS -- 10.4*  HGB 11.1* 12.3*  HCT 31.3* 34.9*  MCV 91.5 90.6  PLT 148* 193   PT/INR: @labrcntip (inr:5) Cardiac Enzymes:  Lab 09/30/12 2154  CKTOTAL --  CKMB --  CKMBINDEX --  TROPONINI <0.30   CBG:  Lab 10/01/12 2227 10/01/12 1206 10/01/12 0804 10/01/12 0206  GLUCAP 325* 169* 171* 171*    Iron Studies: No results found for this basename: IRON:30,TIBC:30,TRANSFERRIN:30,FERRITIN:30 in the last 168 hours  Physical Exam:  Blood pressure 143/76, pulse 94, temperature 97.8 F (36.6 C), temperature source Oral, resp. rate 16, height 6\' 1"  (1.854 m), weight 87 kg (191 lb 12.8 oz), SpO2 99.00%.  Gen: Elderly WM, a bit stiff, alert and responsive, not in distress  Skin: no rash, cyanosis  HEENT: EOMI, sclera anicteric, throat clear  Neck: no JVD, no bruits or LAN  Chest: clear bilat, no rales or rhonchi  Heart: regular,  no rub or gallop, soft SEM  Abdomen: soft, obese, nontender, no ascites or HSM  Ext: no LE edema, L arm AVF + bruit  Neuro: alert, Ox3, no focal deficit, no asterixis, strength 4+/5 in LE's 4+/5 in UE's   Outpatient HD: MWF at [facility], 4 hrs. 2K/2.5Ca bath, L arm AVF. 15 ga needles. Qb 450, A1.5. EDW is 88.5kg. Heparin 6,000u. Zemplar 1ug. No EPO (Hb 12.9).   Impression/Plan  1. Progressive FTT with worsening memory, gait and debility. NSurg doesn't thinks it's due to NPH. Had LP with opening pressure 90 mm (nl less than 250 mm).  2. ESRD, cont hd mwf.  3. HTN/volume- no gross vol excess. On Toprol-XL 50/d, continue. 4. Hx CAD/CABG/ICD- on Ranexa, BB, nitrates 5. Parkinson's disease 6. DM- per primary 7. HPTH- cont vit D, get outpt records 8. Anemia of CKD- no EPO, last Hb was 12.9   Dakota Moselle  MD [facility] [phone] pgr    [phone] cell 10/02/2012, 8:31 AM

## 2012-10-02 NOTE — Progress Notes (Signed)
Patient ID: [name], male   DOB: June 13, 1933, 78 y.o.   MRN: [id number] Results of CSF do not suggest any evidence for infection 150 red cells consistent with a traumatic tap CSF and protein results also consistent.  In light of little clinical change after tap I do not believe that the shunt is malfunctioning or that there is evidence for infection. Nothing further to add from neurosurgical standpoint.

## 2012-10-02 NOTE — Progress Notes (Signed)
Received report  From [facility] RN, pt off unit in HD at start of shift.

## 2012-10-02 NOTE — ED Provider Notes (Signed)
I have personally seen and examined the patient.  I have discussed the plan of care with the resident.  I have reviewed the documentation on PMH/FH/Soc. History.  I have reviewed the documentation of the resident and agree.    Date: 09/30/2012  Rate: 99  Rhythm: indeterminate  QRS Axis: left  Intervals: normal  ST/T Wave abnormalities: nonspecific ST changes  Conduction Disutrbances:paced rhythm   Pt with weakness and inablity to walk on his own.  High risk for fall.  No signs of acute stroke     Joya Gaskins, MD 10/02/12 1556

## 2012-10-02 NOTE — Progress Notes (Signed)
INITIAL ADULT NUTRITION ASSESSMENT Date: 10/02/2012   Time: 10:43 AM Reason for Assessment: MST  INTERVENTION: 1.  Modify diet; resume of PO diet once medically appropriate per MD 2.  Supplements; recommend Nepro BID once diet resumed 3.  Multivitamin/multimineral; pt would likely benefit from Nephrovite daily   DOCUMENTATION CODES Per approved criteria  -Not Applicable     ASSESSMENT: Male 78 y.o.  Dx: Gait abnormality  Hx:  Past Medical History  Diagnosis Date  . SLEEP APNEA, OBSTRUCTIVE     CPAP qhs  . VITAMIN B12 DEFICIENCY   . PARKINSON'S DISEASE   . CORONARY ARTERY DISEASE     s/p  CABG 2003-cath 12/31/09-occluded sap dx  graft, otherwise patent grafts  . Chronic systolic heart failure     LVEF 35-40% by cath 12/31/09 s/p BiV ICD 07/2010  . Gastroparesis   . CORONARY ARTERY BYPASS GRAFT, THREE VESSEL, HX OF 2003  . AUTOMATIC IMPLANTABLE CARDIAC DEFIBRILLATOR SITU 07/2010 BiV ICD  . ANEMIA, MACROCYTIC   . DEPRESSION   . DIABETES MELLITUS, TYPE I   . GERD   . HYPERCHOLESTEROLEMIA   . HYPERTENSION   . RENAL FAILURE, END STAGE     HD MWD a/w anemia of chronic dz, hypotension  . PERIPHERAL NEUROPATHY   . History of colonoscopy   . Renal insufficiency   . Cancer   . Collagen vascular disease   . CVA (cerebral vascular accident) 07/2011    L HP and dysphagia, improved  . Seizures    Past Surgical History  Procedure Date  . Coronary artery bypass graft   . Cataract extraction, bilateral   . Pacemaker placement     permanent  . Dialysis fistula creation   . Shunt replacement   . Doppler echocardiography 2005, 2008, 2010, &2012    Related Meds:  Scheduled Meds:   . atorvastatin  20 mg Oral Daily  . citalopram  20 mg Oral Daily  . insulin aspart  0-15 Units Subcutaneous TID WC  . isosorbide mononitrate  60 mg Oral Daily  . levETIRAcetam  250 mg Oral Q12H  . paricalcitol  1 mcg Oral 3 times weekly  . prasugrel  10 mg Oral Daily  . ranolazine  500 mg Oral  QPM  . sodium chloride  3 mL Intravenous Q12H   Continuous Infusions:  PRN Meds:.sodium chloride, heparin, ondansetron (ZOFRAN) IV, ondansetron, sodium chloride   Ht: 6\' 1"  (185.4 cm)  Wt: 191 lb 12.8 oz (87 kg)  Ideal Wt: 83.6 kg % Ideal Wt: 104%  Usual Wt: variable Wt Readings from Last 10 Encounters:  10/02/12 191 lb 12.8 oz (87 kg)  08/27/12 198 lb (89.812 kg)  06/25/12 199 lb 6.4 oz (90.447 kg)  06/25/12 198 lb (89.812 kg)  04/11/12 198 lb (89.812 kg)  02/29/12 200 lb (90.719 kg)  02/29/12 200 lb 3.2 oz (90.81 kg)  12/28/11 198 lb 6.4 oz (89.994 kg)  12/12/11 194 lb 3.2 oz (88.089 kg)  11/23/11 195 lb 9.6 oz (88.724 kg)  Estimated dry wt: 88.5 kg % Usual Wt: 98%  Body mass index is 25.30 kg/(m^2).  Food/Nutrition Related Hx: wife reported lethargy for 1-2 weeks PTA, worsening gait, difficulty ambulating  Labs: CMP     Component Value Date/Time   NA 137 10/01/2012 0526   K 4.2 10/01/2012 0526   CL 95* 10/01/2012 0526   CO2 25 10/01/2012 0526   GLUCOSE 186* 10/01/2012 0526   BUN 47* 10/01/2012 0526   CREATININE 8.83* 10/01/2012 1610  CALCIUM 9.7 10/01/2012 0526   CALCIUM 8.5 05/18/2010 1144   PROT 8.1 09/30/2012 1803   ALBUMIN 4.3 09/30/2012 1803   AST 18 09/30/2012 1803   ALT 17 09/30/2012 1803   ALKPHOS 26* 09/30/2012 1803   BILITOT 0.7 09/30/2012 1803   GFRNONAA 5* 10/01/2012 0526   GFRAA 6* 10/01/2012 0526    CBC    Component Value Date/Time   WBC 4.4 10/01/2012 0526   WBC 4.6 09/13/2009 1206   RBC 3.42* 10/01/2012 0526   RBC 3.53* 09/13/2009 1206   HGB 11.1* 10/01/2012 0526   HGB 11.0* 09/13/2009 1206   HCT 31.3* 10/01/2012 0526   HCT 31.3* 09/13/2009 1206   PLT 148* 10/01/2012 0526   PLT 190 09/13/2009 1206   MCV 91.5 10/01/2012 0526   MCV 88.7 09/13/2009 1206   MCH 32.5 10/01/2012 0526   MCH 31.2 09/13/2009 1206   MCHC 35.5 10/01/2012 0526   MCHC 35.2 09/13/2009 1206   RDW 15.0 10/01/2012 0526   RDW 16.3* 09/13/2009 1206   LYMPHSABS 1.1 09/30/2012 1803   LYMPHSABS  0.8* 09/13/2009 1206   MONOABS 1.2* 09/30/2012 1803   MONOABS 0.5 09/13/2009 1206   EOSABS 0.1 09/30/2012 1803   EOSABS 0.1 09/13/2009 1206   BASOSABS 0.0 09/30/2012 1803   BASOSABS 0.0 09/13/2009 1206    Intake: 100% Output:   Intake/Output Summary (Last 24 hours) at 10/02/12 1054 Last data filed at 10/02/12 0510  Gross per 24 hour  Intake    440 ml  Output      0 ml  Net    440 ml   No UOP  Diet Order: NPO, previously Renal 80-90  Supplements/Tube Feeding: none at this time  Estimated Nutritional Needs:   Kcal: [id number] Protein: 115-132g Fluid: per MD discretion  Pt admitted with increased lethargy x 1-2 weeks per wife's report. Pt currently in dialysis is oriented to person only, no family at bedside. Pt with nausea and vomiting in the ED, was originally placed on diet with 100% intake, now NPO for ongoing dx work-up.  Pt with minimal wt change and good intake in ED, however RD unable to assess for malnutrition at this time.  Will continue to monitor.  NUTRITION DIAGNOSIS: -Increased nutrient needs (NI-5.1) r/t increased metabolic demand and energy expenditure for dx AEB pt on HD, with possible recent slight wt loss.  Status: Ongoing  MONITORING/EVALUATION(Goals): 1.  Food/Beverage; resume of PO diet with pt consuming >75% 2.  Nutrition-related labs; monitor trends  EDUCATION NEEDS: -Education not appropriate at this time    Loyce Dys, MS RD LDN Clinical Inpatient Dietitian Pager: [phone] Weekend/After hours pager: [phone]

## 2012-10-03 DIAGNOSIS — R110 Nausea: Secondary | ICD-10-CM

## 2012-10-03 DIAGNOSIS — I10 Essential (primary) hypertension: Secondary | ICD-10-CM

## 2012-10-03 LAB — GLUCOSE, CAPILLARY: Glucose-Capillary: 259 mg/dL — ABNORMAL HIGH (ref 70–99)

## 2012-10-03 MED ORDER — HEPARIN SODIUM (PORCINE) 1000 UNIT/ML DIALYSIS
6000.0000 [IU] | INTRAMUSCULAR | Status: DC | PRN
  Filled 2012-10-03: qty 6

## 2012-10-03 NOTE — Evaluation (Signed)
Physical Therapy Evaluation Patient Details Name: [name] MRN: [id number] DOB: [birth year] Today's Date: 10/03/2012 Time: 1010-1047 PT Time Calculation (min): 37 min  PT Assessment / Plan / Recommendation Clinical Impression  Patient s/p gait abnormality with decr mobility secondary to decr balance.  Spoke at length with wife who was debating as to best d/c plan for patient.  Gave both options to patient and wife - NHP for therapy vs. HH with 24 hour care.  Patient, wife and daughter discussed and determined that they can provide the 24 hour care.  Will need HHPT, HHOT and HHaide.  Needs to transition to an Outpt. PT balance program after [facility] releases pt.      PT Assessment  Patient needs continued PT services    Follow Up Recommendations  Supervision/Assistance - 24 hour;Home health PT    Does the patient have the potential to tolerate intense rehabilitation      Barriers to Discharge        Equipment Recommendations  None recommended by PT    Recommendations for Other Services     Frequency Min 3X/week    Precautions / Restrictions Precautions Precautions: Fall Restrictions Weight Bearing Restrictions: No   Pertinent Vitals/Pain VSS, No pain      Mobility  Bed Mobility Bed Mobility: Not assessed Transfers Transfers: Sit to Stand;Stand to Sit Sit to Stand: 4: Min guard;With upper extremity assist;With armrests;From chair/3-in-1 Stand to Sit: 4: Min guard;With upper extremity assist;With armrests;To chair/3-in-1 Details for Transfer Assistance: cues for hand placement Ambulation/Gait Ambulation/Gait Assistance: 4: Min assist Ambulation Distance (Feet): 350 Feet Assistive device: Rolling walker Ambulation/Gait Assistance Details: Ambulated with and without RW.  Pt. with shortened step length almost shuffling at times which was worse without the RW.  Pt. tends to get weight of body anterior to feet and loses balance forward much of the time if not cued and when not  using RW.  RW steadies pt quite a bit and patient had noLOB with the RW.  Feel that if pt could learn to use RW at all times this would incr his safety and gait tremendously.  Several LOB without device.   Gait Pattern: Shuffle;Decreased stride length;Decreased step length - right;Decreased step length - left;Step-to pattern;Narrow base of support Gait velocity: Decreased General Gait Details: Poor gait quality overall.  Needs balance training. Stairs: No Wheelchair Mobility Wheelchair Mobility: No              PT Diagnosis: Generalized weakness  PT Problem List: Decreased balance;Decreased mobility;Decreased activity tolerance;Decreased knowledge of use of DME;Decreased safety awareness PT Treatment Interventions: DME instruction;Gait training;Functional mobility training;Therapeutic activities;Therapeutic exercise;Balance training;Stair training;Patient/family education   PT Goals Acute Rehab PT Goals PT Goal Formulation: With patient Time For Goal Achievement: 10/10/12 Potential to Achieve Goals: Good Pt will go Sit to Stand: Independently PT Goal: Sit to Stand - Progress: Goal set today Pt will Ambulate: >150 feet;with modified independence;with least restrictive assistive device PT Goal: Ambulate - Progress: Goal set today Pt will Go Up / Down Stairs: 1-2 stairs;with modified independence;with least restrictive assistive device PT Goal: Up/Down Stairs - Progress: Goal set today Additional Goals Additional Goal #1: Pt to perform Berg with score of >42/56.   PT Goal: Additional Goal #1 - Progress: Goal set today  Visit Information  Last PT Received On: 10/03/12 Assistance Needed: +1    Subjective Data  Subjective: "I feel better." Patient Stated Goal: To go home   Prior Functioning  Home Living Lives With:  Spouse Available Help at Discharge: Family;Available 24 hours/day Type of Home: House Home Access: Stairs to enter;Ramped entrance Entrance Stairs-Number of Steps:  1 Entrance Stairs-Rails: None Home Layout: One level Bathroom Shower/Tub: Engineer, manufacturing systems: Standard Home Adaptive Equipment: Bedside commode/3-in-1;Shower chair with back;Walker - rolling;Straight cane;Wheelchair - manual Additional Comments: PAtient ambulated independently with cane (would forget cane at times) but was falling per family; had good and bad days in which some days needed more help than other days.  Prior Function Level of Independence: Needs assistance Needs Assistance: Bathing;Dressing;Feeding;Meal Prep;Light Housekeeping;Toileting;Grooming;Gait Bath: Moderate Dressing: Moderate Feeding: Moderate (needed supervision as he choked from over feeding.) Grooming: Supervision/set-up Toileting: Supervision/set-up Meal Prep: Total Light Housekeeping: Total Gait Assistance: Wife asked him to not ambulated independently when she was not there but pt would forget. Able to Take Stairs?: Yes Driving: No Vocation: Retired Musician: No difficulties    Cognition  Overall Cognitive Status: Impaired Area of Impairment: Attention;Memory;Following commands;Safety/judgement;Awareness of deficits;Awareness of errors;Problem solving Arousal/Alertness: Awake/alert Orientation Level: Appears intact for tasks assessed Behavior During Session: [facility] for tasks performed Current Attention Level: Focused Attention - Other Comments: cannot attend to task for > seconds Memory Deficits: Poor STM and LTM Following Commands: Follows one step commands with increased time;Follows one step commands inconsistently Safety/Judgement: Decreased awareness of safety precautions;Decreased safety judgement for tasks assessed;Decreased awareness of need for assistance Awareness of Errors: Assistance required to identify errors made;Assistance required to correct errors made Problem Solving: poor    Extremity/Trunk Assessment Right Lower Extremity Assessment RLE  ROM/Strength/Tone: Deficits RLE ROM/Strength/Tone Deficits: grossly 3/5 RLE Coordination: Deficits RLE Coordination Deficits: poor LE coordination Left Lower Extremity Assessment LLE ROM/Strength/Tone: Deficits LLE ROM/Strength/Tone Deficits: grossly 3/5  LLE Coordination: Deficits LLE Coordination Deficits: poor LE coordination   Balance Static Standing Balance Static Standing - Balance Support: Bilateral upper extremity supported;During functional activity Static Standing - Level of Assistance: 5: Stand by assistance Static Standing - Comment/# of Minutes: 2 minutes with RW steady Single Leg Stance - Right Leg: 4  Tandem Stance - Right Leg: 7   End of Session PT - End of Session Equipment Utilized During Treatment: Gait belt Activity Tolerance: Patient tolerated treatment well Patient left: in chair;with call bell/phone within reach;with family/visitor present Nurse Communication: Mobility status       INGOLD,Djimon Lundstrom 10/03/2012, 12:02 PM  Javion Holmer Ingold,PT Acute Rehabilitation [phone] (pager)

## 2012-10-03 NOTE — Progress Notes (Signed)
Subjective: No complaints , got up with PT and did as good or better than he was at home per family.    Objective Vital signs in last 24 hours: Filed Vitals:   10/03/12 0400 10/03/12 0821 10/03/12 1139 10/03/12 1146  BP: 167/82 144/70 152/71   Pulse: 102 96 98 102  Temp: 98.1 F (36.7 C) 97.5 F (36.4 C)  98.1 F (36.7 C)  TempSrc: Axillary Oral  Oral  Resp: 13 12 16 17   Height:      Weight:      SpO2: 99% 99% 100% 100%   Weight change: 0.7 kg (1 lb 8.7 oz)  Intake/Output Summary (Last 24 hours) at 10/03/12 1236 Last data filed at 10/03/12 0930  Gross per 24 hour  Intake    483 ml  Output      0 ml  Net    483 ml   Labs: Basic Metabolic Panel:  Lab 10/01/12 4098 09/30/12 1803  NA 137 137  K 4.2 3.7  CL 95* 92*  CO2 25 22  GLUCOSE 186* 184*  BUN 47* 37*  CREATININE 8.83* 6.99*  ALB -- --  CALCIUM 9.7 10.1  PHOS -- --   Liver Function Tests:  Lab 09/30/12 1803  AST 18  ALT 17  ALKPHOS 26*  BILITOT 0.7  PROT 8.1  ALBUMIN 4.3   No results found for this basename: LIPASE:3,AMYLASE:3 in the last 168 hours  Lab 10/02/12 1312  AMMONIA 15   CBC:  Lab 10/01/12 0526 09/30/12 1803  WBC 4.4 12.7*  NEUTROABS -- 10.4*  HGB 11.1* 12.3*  HCT 31.3* 34.9*  MCV 91.5 90.6  PLT 148* 193   PT/INR: @labrcntip (inr:5) Cardiac Enzymes:  Lab 09/30/12 2154  CKTOTAL --  CKMB --  CKMBINDEX --  TROPONINI <0.30   CBG:  Lab 10/03/12 1153 10/03/12 0820 10/02/12 2120 10/02/12 1704 10/02/12 1211  GLUCAP 280* 259* 268* 310* 162*    Iron Studies: No results found for this basename: IRON:30,TIBC:30,TRANSFERRIN:30,FERRITIN:30 in the last 168 hours  Physical Exam:  Blood pressure 152/71, pulse 102, temperature 98.1 F (36.7 C), temperature source Oral, resp. rate 17, height 6\' 1"  (1.854 m), weight 87.499 kg (192 lb 14.4 oz), SpO2 100.00%.  Gen: Elderly WM, a bit stiff, alert and responsive, not in distress  Skin: no rash, cyanosis  HEENT: EOMI, sclera anicteric,  throat clear  Neck: no JVD, no bruits or LAN  Chest: clear bilat, no rales or rhonchi  Heart: regular, no rub or gallop, soft SEM  Abdomen: soft, obese, nontender, no ascites or HSM  Ext: no LE edema, L arm AVF + bruit  Neuro: alert, Ox3, no focal deficit, no asterixis, strength 4+/5 in LE's 4+/5 in UE's   Outpatient HD: MWF at [facility], 4 hrs. 2K/2.5Ca bath, L arm AVF. 15 ga needles. Qb 450, A1.5. EDW is 88.5kg. Heparin 6,000u. Zemplar 1ug. No EPO (Hb 12.9).   Impression/Plan  1. Progressive FTT with worsening memory, gait and debility. NSurg doesn't thinks it's due to NPH. PT has seen patient. Perhaps home PT could be considered 2. ESRD, cont hd mwf. HD tomorrow if still here. Below dry weight, losing weight. UF 2-3 kg tomorrow, lower EDW at discharge. 3. HTN/volume- cont Toprol-XL 50/d. 4. Hx CAD/CABG/ICD- on Ranexa, BB, nitrates 5. Parkinson's disease 6. DM- per primary 7. HPTH- cont vit D, get outpt records 8. Anemia of CKD- no EPO, last Hb was 12.9 9. Dispo- per primary. OK for d/c from renal standpoint.  Vinson Moselle  MD [facility] [phone] pgr    [phone] cell 10/03/2012, 12:36 PM

## 2012-10-03 NOTE — Progress Notes (Signed)
[facility]  Patient Status: New  [facility] is providing the following services: PT, OT and HHA  If patient discharges after hours, please call [phone].   Wynelle Bourgeois 10/03/2012, 2:21 PM

## 2012-10-03 NOTE — Progress Notes (Signed)
Order to discharge pt home with home health.  Home health being arranged with family and case Production designer, theatre/television/film.  Pt IV d/ced without problems and reviewed with teachback s/s of infection at site.  Reviewed all d/c instructions with pt and family.  Copy of instructions given. Pt and family notified when to call MD and f/u care, verbalized understanding.  All belongings with pt and pt to be d/ced via [facility] with family.

## 2012-10-03 NOTE — Progress Notes (Signed)
Pt being d/ced via wc with volunteer

## 2012-10-03 NOTE — Discharge Summary (Signed)
Physician Discharge Summary  [name] UJW:[id number] DOB: [birth year] DOA: 09/30/2012  PCP: Rene Paci, MD  Admit date: 09/30/2012 Discharge date: 10/03/2012  Recommendations for Outpatient Follow-up:  1. Please follow up on physical therapy progress and help set up outpatient physical therapy once patient is done with home health agency for physical therapy. 2. Also follow up with htn and blood sugar levels and adjust medication accordingly.  Discharge Diagnoses:  Principal Problem:  *Gait abnormality Active Problems:  PARKINSON'S DISEASE  Chronic systolic heart failure  RENAL FAILURE, END STAGE  ICD-[employer]  Seizure disorder  Type I (juvenile type) diabetes mellitus with renal manifestations, not stated as uncontrolled(250.41)  Nausea and vomiting  Lethargy  S/P VP shunt  Normal pressure hydrocephalus   Discharge Condition: stable   Diet recommendation: diabetic/heart healthy  Filed Weights   10/02/12 0700 10/02/12 1109 10/03/12 0030  Weight: 87 kg (191 lb 12.8 oz) 87 kg (191 lb 12.8 oz) 87.499 kg (192 lb 14.4 oz)    History of present illness:  From original HPI: 78 yo male esrd hd dep mwf, h/o normopress hydroceph s/p vp shunt, cad, comes in with 1-2 weeks of worsening generalized weakness and falling. deneis any fevers/cough. No headache or vision changes. Has been vomiting in the Ed. No abd pain. No cp. No diarrhea. Denies any focal neuro def. Last time he had his vp shunt evaluated by nsx was over a year ago or so and was normal. reciefved dialysis today without any problems.   Hospital Course:  1. Gait abnormality - ammonia WNL, RPR non reactive, folate WNL, Vitamin B 12 mildly elevated. - Not felt to be secondary to neurological process per neurosurgery's evaluation please refer to their note on 10/09 - May be secondary to deconditioning as patient "lays around a lot" at home. As such will plan on placing order for home health with PT/OT/Aid as  recommended by physical therapy.  Pt's family reports that he will have 24 hour supervision at home. - LP negative for infectious etiology or failing shunt. 2. Normal pressure hydrocephalus  - Per neurosurgery's note: A CT scan was performed on initial evaluation which demonstrates a the patient has ventriculomegaly much as he had previously even after placement of the shunt. The shunt catheter appears to be positioned well through a right parietal approach. There is no evidence of any subdural collections  - Neurosurgery evaluated LP results and felt current problem is not related to shunt or any infectious etiology. 3. Parkinsons disease  - Family reports that this was a consideration but patient reportedly not on parkinson medication because this was felt to be a wrong diagnosis.  Will remove from problem list. 4. Nausea and vomiting  - Resolved and likely due to reaction after hemodialysis. Currently patient on Diabetic diet and there is no reports of nausea and emesis today. Improved oral intake on day of d/c. 5. ESRD on HD  - Nephrology on board while patient in house.  Pt to continue his routine schedule for HD.  6. DM - Recommend diabetic diet and will continue home regimen.  Procedures:  HD  LP   Consultations:  Neurosurgery Dr. Danielle Dess  Nephro: Dr. Arlean Hopping  Discharge Exam: Filed Vitals:   10/03/12 0400 10/03/12 0821 10/03/12 1139 10/03/12 1146  BP: 167/82 144/70 152/71   Pulse: 102 96 98 102  Temp: 98.1 F (36.7 C) 97.5 F (36.4 C)  98.1 F (36.7 C)  TempSrc: Axillary Oral  Oral  Resp: 13 12 16  17  Height:      Weight:      SpO2: 99% 99% 100% 100%    General: Pt in NAD, A and O x 3 Cardiovascular: RRR, No MRG Respiratory: CTA BL, no wheezes Abdomen: soft, NT, ND Neuro: Pt answers questions appropriately and moves all extremities equally.  Discharge Instructions  Discharge Orders    Future Appointments: Provider: Department: Dept Phone: Center:   11/07/2012  10:30 AM Newt Lukes, MD [facility] [phone] [facility]   11/12/2012 2:30 PM Marinus Maw, MD [facility] [phone] [facility]   01/02/2013 10:15 AM Romero Belling, MD [facility] [phone] [facility]     Future Orders Please Complete By Expires   Diet - low sodium heart healthy      Increase activity slowly      Discharge instructions      Comments:   Home with home health Pt/ot/aid  Please follow up with PCP in 1-2 weeks or sooner should any new concerns arise.  You will need outpatient physical therapy sessions once home health agency has run it course.   Call MD for:  temperature >100.4      Call MD for:  persistant dizziness or light-headedness      Call MD for:  difficulty breathing, headache or visual disturbances          Medication List     As of 10/03/2012  1:20 PM    TAKE these medications         acetaminophen 325 MG tablet   Commonly known as: TYLENOL   Take 325 mg by mouth every 4 (four) hours as needed. For pain      AMBIEN 5 MG tablet   Generic drug: zolpidem   Take 5 mg by mouth at bedtime as needed. For insomnia      aspirin 81 MG tablet   Take 81 mg by mouth daily.      atorvastatin 20 MG tablet   Commonly known as: LIPITOR   Take 20 mg by mouth daily.      calcium acetate 667 MG capsule   Commonly known as: PHOSLO   3 (three) times daily. 1 capsule at dinner      cholecalciferol 400 UNITS Tabs   Commonly known as: VITAMIN D   Take 1,000 Units by mouth.      citalopram 20 MG tablet   Commonly known as: CELEXA   Take 20 mg by mouth daily.      docusate sodium 100 MG capsule   Commonly known as: COLACE   Take 100 mg by mouth 2 (two) times daily as needed. For constipation      GLUCAGON EMERGENCY 1 MG injection   Generic drug: glucagon   Use as directed      glucose blood test strip   Use as instructed to check blood sugar four times a day dx 250.41      hydrOXYzine 25 MG tablet   Commonly known as: ATARAX/VISTARIL    Take AM of dialysis and every 6 hours as needed for itch symptoms      insulin lispro 100 UNIT/ML injection   Commonly known as: HUMALOG   Inject 10-35 Units into the skin See admin instructions. 4x a day (just before each meal) 10-03-34-15 units (the 15 units is if you eat a bedtime snack).      insulin NPH 100 UNIT/ML injection   Commonly known as: HUMULIN N,NOVOLIN N   Inject 45 Units into the skin at  bedtime.      Insulin Pen Needle 31G X 8 MM Misc   Use as directed five times daily      isosorbide mononitrate 60 MG 24 hr tablet   Commonly known as: IMDUR   Take 60 mg by mouth daily.      Lancets 28G Misc   Use as directed to check blood sugar four times daily dx 250.41      levETIRAcetam 500 MG tablet   Commonly known as: KEPPRA   take 1 in the a.m, 1 at night and 1 after every dialysis session      levETIRAcetam 250 MG tablet   Commonly known as: KEPPRA   Take 1 tablet (250 mg total) by mouth every 12 (twelve) hours. take in addition to 500mg  bid of Keppra.      metoprolol succinate 50 MG 24 hr tablet   Commonly known as: TOPROL-XL   Take one and one-half tabs by mouth once daily      multivitamin Tabs tablet   Take 1 tablet by mouth daily.      Nepro Liqd   Take 1 Can by mouth at bedtime.      NEXIUM 40 MG capsule   Generic drug: esomeprazole   TAKE ONE CAPSULE BY MOUTH TWICE DAILY      nitroGLYCERIN 0.4 MG SL tablet   Commonly known as: NITROSTAT   Place 0.4 mg under the tongue every 5 (five) minutes as needed. For chest pain      NON FORMULARY   CPAP    For obstructive sleep apnea      NON FORMULARY   Dialysis    On Mon Wed Fri  epogen 3800units,  infed 50mg ,  zemplar 2 mcg      ondansetron 4 MG tablet   Commonly known as: ZOFRAN   Take 1 tablet (4 mg total) by mouth 2 (two) times daily as needed for nausea.      oxycodone 5 MG capsule   Commonly known as: OXY-IR   Take 5 mg by mouth every 4 (four) hours as needed. For pain      prasugrel  10 MG Tabs   Commonly known as: EFFIENT   Take 1 tablet (10 mg total) by mouth daily.      promethazine 12.5 MG tablet   Commonly known as: PHENERGAN   Take 12.5 mg by mouth every 4 (four) hours as needed. For nausea      RA LANCING DEVICE Misc   1 Device by Does not apply route once.      RANEXA 500 MG 12 hr tablet   Generic drug: ranolazine   Take 500 mg by mouth every evening.      traMADol 50 MG tablet   Commonly known as: ULTRAM   Take 50 mg by mouth 2 (two) times daily.          The results of significant diagnostics from this hospitalization (including imaging, microbiology, ancillary and laboratory) are listed below for reference.    Significant Diagnostic Studies: Dg Chest 2 View  09/30/2012  *RADIOLOGY REPORT*  Clinical Data: Cough  CHEST - 2 VIEW  Comparison: 08/14/2011  Findings: Right subclavian AICD device and leads are stable.  Clear lungs.  Normal heart size.  No pleural effusion.  No pneumothorax. VP shunt extends across the right hemithorax and neck.  IMPRESSION: No active cardiopulmonary disease.   Original Report Authenticated By: Donavan Burnet, M.D.    Dg Abd 1 View  09/30/2012  *RADIOLOGY REPORT*  Clinical Data: VP shunt, no abdominal complaints  ABDOMEN - 1 VIEW  Comparison: 08/16/2011;  Findings:  Ventriculoperitoneal catheter tubing overlies the right lower abdominal quadrant. Note, the mid aspect of the catheter tubing overlying the peripheral right lateral aspect of abdomen is excluded from view.  Paucity of bowel gas without definite evidence of obstruction.  No definite abnormal intra-abdominal calcifications.  Lumbar and bilateral hip degenerative change.  IMPRESSION: 1.  Ventriculoperitoneal catheter tubing overlies the right mid hemiabdomen, though note, the mid aspect of the catheter tubing is excluded from view. 2.  Paucity of bowel gas without definite evidence of obstruction.   Original Report Authenticated By: Waynard Reeds, M.D.    Ct Head Wo  Contrast  10/01/2012  *RADIOLOGY REPORT*  Clinical Data: 78 year old male with lethargy.  Dialysis. Weakness.  VP shunt.  CT HEAD WITHOUT CONTRAST  Technique:  Contiguous axial images were obtained from the base of the skull through the vertex without contrast.  Comparison: 07/02/2012 and earlier.  Findings: Visualized paranasal sinuses and mastoids are clear. Stable visualized osseous structures.  Stable scalp soft tissues with posterior convexity shunt and reservoir.  Stable orbit soft tissues.  Calcified atherosclerosis at the skull base.  Stable right parietal approach ventriculostomy catheter terminating just across midline at the level of the septum pellucidum.  Stable ventriculomegaly. No transependymal edema is evident. No midline shift, mass effect, or evidence of mass lesion.  No acute intracranial hemorrhage identified.  Stable gray-white matter differentiation throughout the brain.  No evidence of cortically based acute infarction identified.  No suspicious intracranial vascular hyperdensity.  IMPRESSION: Stable ventriculomegaly, ventricular shunt, and noncontrast CT appearance of the brain.   Original Report Authenticated By: Harley Hallmark, M.D.     Microbiology: Recent Results (from the past 240 hour(s))  MRSA PCR SCREENING     Status: Normal   Collection Time   10/01/12  4:37 AM      Component Value Range Status Comment   MRSA by PCR NEGATIVE  NEGATIVE Final   BODY FLUID CULTURE     Status: Normal (Preliminary result)   Collection Time   10/01/12 10:44 AM      Component Value Range Status Comment   Specimen Description CSF   Final    Special Requests Normal   Final    Gram Stain     Final    Value: NO WBC SEEN     NO ORGANISMS SEEN     Performed at [facility]   Culture NO GROWTH 1 DAY   Final    Report Status PENDING   Incomplete   GRAM STAIN     Status: Normal   Collection Time   10/01/12 10:48 AM      Component Value Range Status Comment   Specimen Description CSF    Final    Special Requests NONE   Final    Gram Stain     Final    Value: CYTOSPIN SLIDE:     NO WBC SEEN     NO ORGANISMS SEEN   Report Status 10/01/2012 FINAL   Final      Labs: Basic Metabolic Panel:  Lab 10/01/12 2440 09/30/12 1803  NA 137 137  K 4.2 3.7  CL 95* 92*  CO2 25 22  GLUCOSE 186* 184*  BUN 47* 37*  CREATININE 8.83* 6.99*  CALCIUM 9.7 10.1  MG -- --  PHOS -- --   Liver Function Tests:  Lab 09/30/12 1803  AST 18  ALT 17  ALKPHOS 26*  BILITOT 0.7  PROT 8.1  ALBUMIN 4.3   No results found for this basename: LIPASE:5,AMYLASE:5 in the last 168 hours  Lab 10/02/12 1312  AMMONIA 15   CBC:  Lab 10/01/12 0526 09/30/12 1803  WBC 4.4 12.7*  NEUTROABS -- 10.4*  HGB 11.1* 12.3*  HCT 31.3* 34.9*  MCV 91.5 90.6  PLT 148* 193   Cardiac Enzymes:  Lab 09/30/12 2154  CKTOTAL --  CKMB --  CKMBINDEX --  TROPONINI <0.30   BNP: BNP (last 3 results) No results found for this basename: PROBNP:3 in the last 8760 hours CBG:  Lab 10/03/12 1153 10/03/12 0820 10/02/12 2120 10/02/12 1704 10/02/12 1211  GLUCAP 280* 259* 268* 310* 162*    Time coordinating discharge: > 40 minutes  Signed:  Penny Pia  [facility] 10/03/2012, 1:20 PM

## 2012-10-03 NOTE — Progress Notes (Signed)
  Date Initiated:  10/02/2012  Documentation initiated by:  Sinan Tuch  Subjective/Objective Assessment:   78 yr-old male adm with dx of gait abnormality; lives with spouse, followed by [facility] care mgmt      DC Planning Services  CM consult      [facility] Choice  HOME HEALTH   Choice offered to / List presented to:  C-3 Spouse       HH arranged  HH-2 PT  HH-3 OT  HH-4 NURSE'S AIDE      HH agency  [facility]   Status of service:  Completed, signed off Medicare Important Message given?  YES (If response is "NO", the following Medicare IM given date fields will be blank) Date Medicare IM given:  10/03/2012 Date Additional Medicare IM given:    Discharge Disposition:  HOME W HOME HEALTH SERVICES  Per UR Regulation:  Reviewed for med. necessity/level of care/duration of stay  Comments:  PCP:  Dr. Rene Paci  Contact:  Sanay Belmar, spouse [phone] cell  10/03/12 1332 Devontaye [address] RN MSN CCM Pt recommends home health therapy and aide.  Spouse and daughter will provide 24/7 care.  Provided list of [facility] health agencies, spouse chose agency that provided care previously.  Referral made to [facility] liaison.

## 2012-10-04 LAB — BODY FLUID CULTURE

## 2012-10-08 ENCOUNTER — Telehealth: Payer: Self-pay

## 2012-10-08 ENCOUNTER — Telehealth: Payer: Self-pay | Admitting: *Deleted

## 2012-10-08 ENCOUNTER — Telehealth: Payer: Self-pay | Admitting: Internal Medicine

## 2012-10-08 NOTE — Telephone Encounter (Signed)
**Note De-Identified  Obfuscation** Pt's wife is advised to contact Dr. Felicity Coyer, pt's pcp. She verbalized understanding.

## 2012-10-08 NOTE — Telephone Encounter (Signed)
Pt's spouse declined appt stating pt has another OV tomorrow and will be too tried to come in for OV with TLJ afterward. Spouse requested direct admitting to hospital for tomorrow but was advised if admission is necessary it can only be done through the ER. Spouse agreed and will monitor pt's sxs and take him to ER today if needed.

## 2012-10-08 NOTE — Telephone Encounter (Signed)
Pt has not been feeling well all day and wife wants to know is ok for him to take more zofran he already takes it twice a day.

## 2012-10-08 NOTE — Telephone Encounter (Signed)
Discussed with Dr. Everardo All pt needs to be seen tomorrow scheduled with Dr. Yetta Barre @ 11:15am. If patient symptoms worsen he is to go to the ED.

## 2012-10-08 NOTE — Telephone Encounter (Signed)
R'cd fax from [employer] for PA for generic Zofran-PA approved through 10/08/2013-[facility] informed.

## 2012-10-08 NOTE — Telephone Encounter (Signed)
HHPT called stating pt has been c/o severe nausea and general weakness today. Pt's Bp was 116/70 when sitting but dropped to 80/48 upon standing causing him to became nauseated and he vomited twice. HHPT is concerned about pt's BP and that he may become dehydrated - she suggests increasing zofran to TID until OV with VAL 10/24, please advise in VAL's absence, thanks.

## 2012-10-09 ENCOUNTER — Ambulatory Visit: Payer: Medicare Other | Admitting: Internal Medicine

## 2012-10-17 ENCOUNTER — Ambulatory Visit (INDEPENDENT_AMBULATORY_CARE_PROVIDER_SITE_OTHER): Payer: Medicare Other | Admitting: Internal Medicine

## 2012-10-17 ENCOUNTER — Encounter: Payer: Self-pay | Admitting: Internal Medicine

## 2012-10-17 VITALS — BP 132/84 | HR 123 | Temp 97.2°F | Ht 73.0 in | Wt 195.0 lb

## 2012-10-17 DIAGNOSIS — R531 Weakness: Secondary | ICD-10-CM

## 2012-10-17 DIAGNOSIS — R110 Nausea: Secondary | ICD-10-CM

## 2012-10-17 DIAGNOSIS — R269 Unspecified abnormalities of gait and mobility: Secondary | ICD-10-CM

## 2012-10-17 DIAGNOSIS — E7800 Pure hypercholesterolemia, unspecified: Secondary | ICD-10-CM

## 2012-10-17 DIAGNOSIS — M6281 Muscle weakness (generalized): Secondary | ICD-10-CM

## 2012-10-17 MED ORDER — PROMETHAZINE HCL 12.5 MG RE SUPP: 12.5000 mg | Freq: Four times a day (QID) | RECTAL | Status: AC | PRN

## 2012-10-17 NOTE — Progress Notes (Signed)
Subjective:    Patient ID: [name], male    DOB: 1933-01-16, 78 y.o.   MRN: [id number]  HPI  Here for hospital follow up -  *Gait abnormality  PARKINSON'S DISEASE  Chronic systolic heart failure  RENAL FAILURE, END STAGE  ICD-Medtronic  Seizure disorder  Type I (juvenile type) diabetes mellitus with renal manifestations, not stated as uncontrolled(250.41)  Nausea and vomiting  Lethargy  S/P VP shunt  Normal pressure hydrocephalus   still taking zofran bid for nausea with fair control of chronic nausea symptoms - ?prometh supp prn  CVA with L HP and dysphagia 07/2011, specific deficits at that time resolved s/p SNF rehab for same - home with family since 09/08/11  Seizures - dx fall 2012 after neuro eval for "AMS" and "unresponsive spells" On Keppra and imporved symptoms with fewer recurrences - last dose increase 10/2011 No incontinence, no seziure motor activity during any spells - no bowel changes, no abdominal pain   Gait disorder and weakness, falls at home with reluctance to use walker -  nsurg eval 07/19/11: head ct ok (no recurrent NPH change since VP shunt 08/2009) and repeat Nsurg eval 09/2012 hosp unremarkable no MRI due to PPM Still walking "bent over and not picking feet up" - prior ?pk dz before tx for NPH 08/2009 - would like to reeval same   Past Medical History  Diagnosis Date  . SLEEP APNEA, OBSTRUCTIVE     CPAP qhs  . VITAMIN B12 DEFICIENCY   . PARKINSON'S DISEASE   . CORONARY ARTERY DISEASE     s/p  CABG 2003-cath 12/31/09-occluded sap dx  graft, otherwise patent grafts  . Chronic systolic heart failure     LVEF 35-40% by cath 12/31/09 s/p BiV ICD 07/2010  . Gastroparesis   . CORONARY ARTERY BYPASS GRAFT, THREE VESSEL, HX OF 2003  . AUTOMATIC IMPLANTABLE CARDIAC DEFIBRILLATOR SITU 07/2010 BiV ICD  . ANEMIA, MACROCYTIC   . DEPRESSION   . DIABETES MELLITUS, TYPE I   . GERD   . HYPERCHOLESTEROLEMIA   . HYPERTENSION   . RENAL FAILURE, END STAGE     HD  MWD a/w anemia of chronic dz, hypotension  . PERIPHERAL NEUROPATHY   . CVA (cerebral vascular accident) 07/2011    L HP and dysphagia, improved  . Seizures    Review of Systems  Constitutional: Positive for fatigue. Negative for fever and unexpected weight change.  Respiratory: Negative for shortness of breath.   Cardiovascular: Negative for chest pain and leg swelling.  Gastrointestinal: Negative for abdominal pain, diarrhea, constipation and blood in stool.  Neurological: Negative for dizziness and facial asymmetry.       Objective:   Physical Exam  BP 132/84  Pulse 123  Temp 97.2 F (36.2 C) (Oral)  Ht 6\' 1"  (1.854 m)  Wt 195 lb (88.451 kg)  BMI 25.73 kg/m2  SpO2 98% Wt Readings from Last 3 Encounters:  10/17/12 195 lb (88.451 kg)  10/03/12 192 lb 14.4 oz (87.499 kg)  08/27/12 198 lb (89.812 kg)   Constitutional:  he appears well-developed and well-nourished. No distress. Wife and dtr at side Neck: Normal range of motion. Neck supple. No JVD present. No thyromegaly present.  Cardiovascular: Normal rate, regular rhythm and normal heart sounds.  No murmur heard. No BLE edema Pulmonary/Chest: Effort normal and breath sounds normal. No respiratory distress. no wheezes.  MSkel: no gross deformities, no effusions Neuro: AAOx2, CN2-12 symmetrically intact; MAE well - gait slow with use of RW,  no detectable L HP or dysarthria Psyc: reserved affect, flat and withdrawn mood      Lab Results  Component Value Date   WBC 4.4 10/01/2012   HGB 11.1* 10/01/2012   HCT 31.3* 10/01/2012   PLT 148* 10/01/2012   CHOL 131 08/09/2011   TRIG 193* 08/09/2011   HDL 30* 08/09/2011   LDLDIRECT 68.2 07/26/2010   ALT 17 09/30/2012   AST 18 09/30/2012   NA 137 10/01/2012   K 4.2 10/01/2012   CL 95* 10/01/2012   CREATININE 8.83* 10/01/2012   BUN 47* 10/01/2012   CO2 25 10/01/2012   TSH 1.296 08/15/2010   INR 1.14 08/09/2011   HGBA1C 7.3* 10/01/2012     Assessment & Plan:  Weakness, chronic and  multifactorial, now with rapidly progressive physical endurance and cognitive decline likely related to underlying dementia as well as numerous chronic medical issues: hypotension, dialysis, meds - ? PK dz - encouraged review of same with neuro - refer to new movement disorder specialist done ([place]) Continue orthostatic precautions, not to go unsupervised - meds for blood pressure as per dialysis providers and if SBP<100 in AM, hold toprol  CVA 07/2011 - L HP and dysphagia improved, occasional "choke" events persist -  cont med mgmt and OP neuro follow up   Time spent with pt/family today 25 minutes, greater than 50% time spent counseling patient on hospitalization for progressive weakness, gait disorder, nausea and medication review. Also review of prior records

## 2012-10-17 NOTE — Patient Instructions (Signed)
It was good to see you today. We have reviewed your prior records including labs and tests today we'll make referral to Dr. Arbutus Leas for movement evaluation . Our office will contact you regarding appointment(s) once made. Okay to use Phenergan suppository as needed when Zofran ineffective - Your prescription(s) have been submitted to your pharmacy. Please take as directed and contact our office if you believe you are having problem(s) with the medication(s). Other medications reviewed, no additional changes Please schedule followup in 3-4 months, call sooner if problems. (Okay to cancel November appointment)

## 2012-10-22 ENCOUNTER — Telehealth: Payer: Self-pay | Admitting: Internal Medicine

## 2012-10-22 NOTE — Telephone Encounter (Signed)
Harriett Sine (home health nurse from [facility]) is calling to see if she can decrease the frequency of the order to visit patient for therapy.  Harriett Sine she has been to see the patient twice and both times pt has been unable to participate due to nausea/vomiting.  Harriett Sine is requesting a decrease in the frequency for November to 1 visit at which time she will re-evaluate the patient to see if patient is able to continue/participate in therapy.  OFFICE, please follow up with the home health nurse.

## 2012-10-23 NOTE — Telephone Encounter (Signed)
We are working with pt's nausea meds, so please continue to try 2x/week for now - thanks

## 2012-10-23 NOTE — Telephone Encounter (Signed)
HHRN advised via secure VM 

## 2012-10-29 ENCOUNTER — Ambulatory Visit: Payer: Medicare Other | Admitting: Internal Medicine

## 2012-10-30 ENCOUNTER — Telehealth: Payer: Self-pay | Admitting: *Deleted

## 2012-10-30 DIAGNOSIS — N186 End stage renal disease: Secondary | ICD-10-CM

## 2012-10-30 DIAGNOSIS — Z5189 Encounter for other specified aftercare: Secondary | ICD-10-CM

## 2012-10-30 DIAGNOSIS — R112 Nausea with vomiting, unspecified: Secondary | ICD-10-CM

## 2012-10-30 DIAGNOSIS — E1029 Type 1 diabetes mellitus with other diabetic kidney complication: Secondary | ICD-10-CM

## 2012-10-30 NOTE — Telephone Encounter (Signed)
ok 

## 2012-10-30 NOTE — Telephone Encounter (Signed)
[facility] PT called for orders to continue pt's PT for 1x/week x 5 for progress with endurance and gait (Pt's nausea has also subsided per PT). Okay for verbal order?

## 2012-10-31 NOTE — Telephone Encounter (Signed)
[facility] PT informed of verbal order via VM and to callback office with any questions/concerns.

## 2012-11-04 ENCOUNTER — Other Ambulatory Visit: Payer: Self-pay | Admitting: Internal Medicine

## 2012-11-07 ENCOUNTER — Ambulatory Visit: Payer: Medicare Other | Admitting: Neurology

## 2012-11-07 ENCOUNTER — Ambulatory Visit: Payer: Medicare Other | Admitting: Internal Medicine

## 2012-11-12 ENCOUNTER — Encounter: Payer: Self-pay | Admitting: Internal Medicine

## 2012-11-12 ENCOUNTER — Ambulatory Visit (INDEPENDENT_AMBULATORY_CARE_PROVIDER_SITE_OTHER): Payer: Medicare Other | Admitting: Internal Medicine

## 2012-11-12 ENCOUNTER — Other Ambulatory Visit: Payer: Self-pay | Admitting: Internal Medicine

## 2012-11-12 VITALS — BP 112/53 | HR 93 | Resp 18 | Ht 73.0 in | Wt 199.0 lb

## 2012-11-12 DIAGNOSIS — Z95810 Presence of automatic (implantable) cardiac defibrillator: Secondary | ICD-10-CM

## 2012-11-12 DIAGNOSIS — I2510 Atherosclerotic heart disease of native coronary artery without angina pectoris: Secondary | ICD-10-CM

## 2012-11-12 DIAGNOSIS — I5022 Chronic systolic (congestive) heart failure: Secondary | ICD-10-CM

## 2012-11-12 LAB — ICD DEVICE OBSERVATION
AL IMPEDENCE ICD: 456 Ohm
ATRIAL PACING ICD: 0.01 pct
BAMS-0001: 170 {beats}/min
CHARGE TIME: 10.29 s
FVT: 0
LV LEAD THRESHOLD: 1 V
PACEART VT: 0
TOT-0001: 1
TOT-0002: 0
TZAT-0001ATACH: 1
TZAT-0001FASTVT: 1
TZAT-0002ATACH: NEGATIVE
TZAT-0004SLOWVT: 8
TZAT-0004SLOWVT: 8
TZAT-0011SLOWVT: 10 ms
TZAT-0011SLOWVT: 10 ms
TZAT-0012ATACH: 150 ms
TZAT-0012ATACH: 150 ms
TZAT-0012FASTVT: 200 ms
TZAT-0012SLOWVT: 200 ms
TZAT-0012SLOWVT: 200 ms
TZAT-0013SLOWVT: 2
TZAT-0013SLOWVT: 2
TZAT-0018ATACH: NEGATIVE
TZAT-0020ATACH: 1.5 ms
TZAT-0020ATACH: 1.5 ms
TZAT-0020ATACH: 1.5 ms
TZAT-0020FASTVT: 1.5 ms
TZON-0003ATACH: 350 ms
TZON-0003SLOWVT: 340 ms
TZON-0003VSLOWVT: 400 ms
TZON-0004SLOWVT: 16
TZST-0001ATACH: 4
TZST-0001FASTVT: 2
TZST-0001FASTVT: 4
TZST-0001FASTVT: 6
TZST-0001SLOWVT: 4
TZST-0001SLOWVT: 6
TZST-0002ATACH: NEGATIVE
TZST-0002FASTVT: NEGATIVE
TZST-0002FASTVT: NEGATIVE
TZST-0002FASTVT: NEGATIVE
TZST-0003SLOWVT: 20 J
TZST-0003SLOWVT: 25 J
TZST-0003SLOWVT: 35 J
VENTRICULAR PACING ICD: 99.97 pct

## 2012-11-12 NOTE — Progress Notes (Signed)
HPI [name] returns today for followup. He is a pleasant 78 yo man with a h/o chronic systolic heart failure, end-stage renal disease on dialysis, complete heart block, and an ischemic cardiomyopathy. In the interim, he has been stable except for his chronic dyspnea. No syncope. No ICD shocks. He admits to a very sedentary lifestyle. Allergies  Allergen Reactions  . Nifedipine     Unknown  . Tape     Unknown     Current Outpatient Prescriptions  Medication Sig Dispense Refill  . acetaminophen (TYLENOL) 325 MG tablet Take 325 mg by mouth every 4 (four) hours as needed. For pain      . aspirin 81 MG tablet Take 81 mg by mouth daily.        Marland Kitchen atorvastatin (LIPITOR) 20 MG tablet Take 20 mg by mouth daily.        . calcium acetate (PHOSLO) 667 MG capsule 3 (three) times daily. 1 capsule at dinner      . cholecalciferol (VITAMIN D) 400 UNITS TABS Take 1,000 Units by mouth.      . citalopram (CELEXA) 20 MG tablet Take 20 mg by mouth daily.        Marland Kitchen docusate sodium (COLACE) 100 MG capsule Take 100 mg by mouth 2 (two) times daily as needed. For constipation      . glucagon (GLUCAGON EMERGENCY) 1 MG injection Use as directed       . glucose blood (TRUETEST TEST) test strip Use as instructed to check blood sugar four times a day dx 250.41  200 each  3  . hydrOXYzine (ATARAX/VISTARIL) 25 MG tablet Take AM of dialysis and every 6 hours as needed for itch symptoms  60 tablet  1  . insulin lispro (HUMALOG) 100 UNIT/ML injection Inject 10-35 Units into the skin See admin instructions. 4x a day (just before each meal) 10-03-34-15 units (the 15 units is if you eat a bedtime snack).      . insulin NPH (HUMULIN N PEN) 100 UNIT/ML injection Inject 45 Units into the skin at bedtime.  45 mL  3  . Insulin Pen Needle (B-D ULTRAFINE III SHORT PEN) 31G X 8 MM MISC Use as directed five times daily  600 each  3  . isosorbide mononitrate (IMDUR) 60 MG 24 hr tablet Take 60 mg by mouth daily. On non dialysis days        . Lancet Devices (RA LANCING DEVICE) MISC 1 Device by Does not apply route once.  1 each  0  . Lancets 28G MISC Use as directed to check blood sugar four times daily dx 250.41  200 each  3  . levETIRAcetam (KEPPRA) 250 MG tablet Take 1 tablet (250 mg total) by mouth every 12 (twelve) hours. take in addition to 500mg  bid of Keppra.  60 tablet  11  . levETIRAcetam (KEPPRA) 500 MG tablet take 1 in the a.m, 1 at night and 1 after every dialysis session  75 tablet  6  . metoprolol succinate (TOPROL-XL) 50 MG 24 hr tablet Take one and one-half tabs by mouth once daily  45 tablet  11  . multivitamin (RENA-VIT) TABS tablet Take 1 tablet by mouth daily.        Marland Kitchen NEXIUM 40 MG capsule TAKE ONE CAPSULE BY MOUTH TWICE DAILY  180 capsule  0  . nitroGLYCERIN (NITROSTAT) 0.4 MG SL tablet Place 0.4 mg under the tongue every 5 (five) minutes as needed. For chest pain      .  NON FORMULARY CPAP  For obstructive sleep apnea       . NON FORMULARY Dialysis  On Mon Wed Fri epogen 3800units,  infed 50mg ,  zemplar 2 mcg      . Nutritional Supplements (NEPRO) LIQD Take 1 Can by mouth at bedtime.       . ondansetron (ZOFRAN) 4 MG tablet Take 1 tablet (4 mg total) by mouth 2 (two) times daily as needed for nausea.  60 tablet  6  . ondansetron (ZOFRAN) 4 MG tablet TAKE 1 TABLET BY MOUTH TWICE DAILY AS NEEDED FOR NAUSEA  60 tablet  0  . oxycodone (OXY-IR) 5 MG capsule Take 5 mg by mouth every 4 (four) hours as needed. For pain      . prasugrel (EFFIENT) 10 MG TABS Take 1 tablet (10 mg total) by mouth daily.  30 tablet  8  . promethazine (PHENERGAN) 12.5 MG tablet Take 12.5 mg by mouth every 4 (four) hours as needed. For nausea      . promethazine (PROMETHEGAN) 12.5 MG suppository Place 1 suppository (12.5 mg total) rectally every 6 (six) hours as needed for nausea.  12 each  0  . ranolazine (RANEXA) 500 MG 12 hr tablet Take 500 mg by mouth every evening.       . traMADol (ULTRAM) 50 MG tablet Take 50 mg by mouth 2 (two)  times daily.       Marland Kitchen zolpidem (AMBIEN) 5 MG tablet Take 5 mg by mouth at bedtime as needed. For insomnia         Past Medical History  Diagnosis Date  . SLEEP APNEA, OBSTRUCTIVE     CPAP qhs  . VITAMIN B12 DEFICIENCY   . PARKINSON'S DISEASE   . CORONARY ARTERY DISEASE     s/p  CABG 2003-cath 12/31/09-occluded sap dx  graft, otherwise patent grafts  . Chronic systolic heart failure     LVEF 35-40% by cath 12/31/09 s/p BiV ICD 07/2010  . Gastroparesis   . CORONARY ARTERY BYPASS GRAFT, THREE VESSEL, HX OF 2003  . AUTOMATIC IMPLANTABLE CARDIAC DEFIBRILLATOR SITU 07/2010 BiV ICD  . ANEMIA, MACROCYTIC   . DEPRESSION   . DIABETES MELLITUS, TYPE I   . GERD   . HYPERCHOLESTEROLEMIA   . HYPERTENSION   . RENAL FAILURE, END STAGE     HD MWD a/w anemia of chronic dz, hypotension  . PERIPHERAL NEUROPATHY   . CVA (cerebral vascular accident) 07/2011    L HP and dysphagia, improved  . Seizures     ROS:   All systems reviewed and negative except as noted in the HPI.   Past Surgical History  Procedure Date  . Coronary artery bypass graft   . Cataract extraction, bilateral   . Pacemaker placement     permanent  . Dialysis fistula creation   . Shunt replacement   . Doppler echocardiography 2005, 2008, 2010, &2012     Family History  Problem Relation Age of Onset  . Stroke Mother   . Diabetes Father   . Lung cancer Father   . Diabetes Brother   . Stomach cancer Paternal Aunt   . Colon cancer Neg Hx      History   Social History  . Marital Status: Married    Spouse Name: N/A    Number of Children: 2  . Years of Education: N/A   Occupational History  . retired    Social History Main Topics  . Smoking status: Never Smoker   .  Smokeless tobacco: Never Used     Comment: Married, lives with spouse, supportive dtr and son nearby. Pt is on dialysis  . Alcohol Use: No  . Drug Use: No  . Sexually Active: Not on file   Other Topics Concern  . Not on file   Social History  Narrative   Pt does not get regular exercise.Pt is on dialysis-MWF_since 10/2010Lives with spouse, supportive dtr and son nearby     BP 112/53  Pulse 93  Resp 18  Ht 6\' 1"  (1.854 m)  Wt 199 lb (90.266 kg)  BMI 26.25 kg/m2  Physical Exam:  Well appearing 78 year old man, NAD HEENT: Unremarkable Neck:  8 cm JVD, no thyromegally Lungs:  Clear with no wheezes, rales, or rhonchi. HEART:  Regular rate rhythm, no murmurs, no rubs, no clicks Abd:  soft, positive bowel sounds, no organomegally, no rebound, no guarding Ext:  2 plus pulses, no edema, no cyanosis, no clubbing Skin:  No rashes no nodules Neuro:  CN II through XII intact, motor grossly intact  DEVICE  Normal device function.  See PaceArt for details.   Assess/Plan:

## 2012-11-12 NOTE — Patient Instructions (Signed)
Your physician wants you to follow-up in: 12 months with Dr. Taylor. You will receive a reminder letter in the mail two months in advance. If you don't receive a letter, please call our office to schedule the follow-up appointment.    

## 2012-11-12 NOTE — Assessment & Plan Note (Signed)
His symptoms remain class II, almost class III. I've encouraged the patient to reduce his salt intake. He'll continue his current medical therapy.

## 2012-11-12 NOTE — Assessment & Plan Note (Signed)
His Medtronic biventricular ICD is working normally. We'll plan to recheck in several months. Review of his histograms and cardiac contrast demonstrates very markedly reduced activity. I've encouraged the patient to increase his physical activity.

## 2012-11-14 ENCOUNTER — Ambulatory Visit: Payer: Medicare Other | Admitting: Neurology

## 2012-11-19 ENCOUNTER — Ambulatory Visit: Payer: Medicare Other | Admitting: Neurology

## 2012-11-22 ENCOUNTER — Other Ambulatory Visit: Payer: Self-pay | Admitting: Endocrinology

## 2012-11-25 ENCOUNTER — Other Ambulatory Visit: Payer: Self-pay | Admitting: Gastroenterology

## 2012-12-02 ENCOUNTER — Encounter: Payer: Self-pay | Admitting: Internal Medicine

## 2012-12-03 ENCOUNTER — Ambulatory Visit (INDEPENDENT_AMBULATORY_CARE_PROVIDER_SITE_OTHER): Payer: Medicare Other | Admitting: Neurology

## 2012-12-03 ENCOUNTER — Encounter: Payer: Self-pay | Admitting: Neurology

## 2012-12-03 VITALS — BP 92/50 | HR 92 | Temp 97.5°F | Resp 18 | Wt 197.0 lb

## 2012-12-03 DIAGNOSIS — G219 Secondary parkinsonism, unspecified: Secondary | ICD-10-CM

## 2012-12-03 DIAGNOSIS — G40909 Epilepsy, unspecified, not intractable, without status epilepticus: Secondary | ICD-10-CM

## 2012-12-03 MED ORDER — LEVETIRACETAM 250 MG PO TABS: 250.0000 mg | ORAL_TABLET | Freq: Two times a day (BID) | ORAL | Status: DC

## 2012-12-03 MED ORDER — LEVETIRACETAM 500 MG PO TABS: ORAL_TABLET | ORAL | Status: DC

## 2012-12-03 MED ORDER — RIVASTIGMINE 9.5 MG/24HR TD PT24: 1.0000 | MEDICATED_PATCH | Freq: Every day | TRANSDERMAL | Status: DC

## 2012-12-03 MED ORDER — RIVASTIGMINE 4.6 MG/24HR TD PT24: 1.0000 | MEDICATED_PATCH | Freq: Every day | TRANSDERMAL | Status: DC

## 2012-12-03 NOTE — Progress Notes (Signed)
[name] was seen today in the movement disorders clinic for neurologic f/u.  This patient is accompanied in the office by his spouse and child who supplements the history.  The patient had previously seen Dr. Modesto Charon.  I reviewed Dr. Nash Dimmer notes as well as Dr. Laverna Peace.  The patient has a very complicated history.  The patient has a history of diabetes mellitus type 1, orthostatic hypotension, end stage renal disease (dialysis dependent), and a historical diagnosis of NPH.  The patient's shunt was reevaluated in October, 2013 at which point the patient had a lumbar puncture and opening pressure was normal at 90 mm of water.  The lumbar puncture did not help symptoms.    According to Dr. Nash Dimmer notes, the patient has had multiple episodes of confusional episodes, sometimes lasting 20 minutes and sometimes lasting half a day.  Dr. Modesto Charon suspected that these could potentially be seizure.  An EEG was performed and revealed a mild to moderate encephalopathy and diffuse slowing over the right hemisphere, likely related to his shunt placement. In addition he had waxing and waning beta activities at the vertex of uncertain significance.   The patient is on Keppra.   Dr. Modesto Charon also suspected that the patient had a dementia that was separate and unrelated to NPH.  HISTORY UPDATE:  12/03/2012  In 2010, the patient went to the dr because of falls and nausea.  He was referred to [facility] and initially dx with PD.  After some time and because PD meds were not helping, a LP was done.  After that, they determined that the patient had NPH.  He did not have urinary incontinence ever.  Not long after the dx a shunt was placed.  After the shunt, the falls got better as did the nausea (for a short period of time).  However, right after the surgery he developed CHF and started on dialysis.  After that (not before), orthostatic hypotension was a problem.  Today, the patient/family state that short term memory continues to be a problem.   He continues to have confusional episodes and he will be tremulous.  Sometimes, he is aware during the event and sometimes not.  Episodes are at a max 1 time per week now (much improved, was having them daily).  He is able to walk during the episode with max assist but if he is standing he would fall.  He has some hallucinations for the last 1-2 years (brown bears) but they don't think that this proceeded the diagnosis of NPH.   He has had no tremor.  He is taking the tramadol q day (was bid) but Dr. Modesto Charon advised them to try and cut back. They think that the Keppra and cutting back the ultram decreased frequency of confusional episodes.    ALLERGIES:   Allergies  Allergen Reactions  . Nifedipine     Unknown  . Tape     Unknown    CURRENT MEDICATIONS:  Current Outpatient Prescriptions on File Prior to Visit  Medication Sig Dispense Refill  . acetaminophen (TYLENOL) 325 MG tablet Take 325 mg by mouth every 4 (four) hours as needed. For pain      . aspirin 81 MG tablet Take 81 mg by mouth daily.        Marland Kitchen atorvastatin (LIPITOR) 20 MG tablet Take 20 mg by mouth daily.        . calcium acetate (PHOSLO) 667 MG capsule 3 (three) times daily. 1 capsule at dinner      .  cholecalciferol (VITAMIN D) 400 UNITS TABS Take 1,000 Units by mouth.      . citalopram (CELEXA) 20 MG tablet Take 20 mg by mouth daily.        Marland Kitchen docusate sodium (COLACE) 100 MG capsule Take 100 mg by mouth 2 (two) times daily as needed. For constipation      . glucagon (GLUCAGON EMERGENCY) 1 MG injection Use as directed       . glucose blood (TRUETEST TEST) test strip Use as instructed to check blood sugar four times a day dx 250.41  200 each  3  . HUMULIN N PEN 100 UNIT/ML injection INJECT 45 UNITS INTO THE SKIN AT BEDTIME AS DIRECTED  45 mL  0  . hydrOXYzine (ATARAX/VISTARIL) 25 MG tablet Take AM of dialysis and every 6 hours as needed for itch symptoms  60 tablet  1  . insulin lispro (HUMALOG) 100 UNIT/ML injection Inject 10-35  Units into the skin See admin instructions. 4x a day (just before each meal) 10-03-34-15 units (the 15 units is if you eat a bedtime snack).      . Insulin Pen Needle (B-D ULTRAFINE III SHORT PEN) 31G X 8 MM MISC Use as directed five times daily  600 each  3  . isosorbide mononitrate (IMDUR) 60 MG 24 hr tablet Take 60 mg by mouth daily. On non dialysis days      . Lancet Devices (RA LANCING DEVICE) MISC 1 Device by Does not apply route once.  1 each  0  . Lancets 28G MISC Use as directed to check blood sugar four times daily dx 250.41  200 each  3  . metoprolol succinate (TOPROL-XL) 50 MG 24 hr tablet Take one and one-half tabs by mouth once daily  45 tablet  11  . multivitamin (RENA-VIT) TABS tablet Take 1 tablet by mouth daily.        Marland Kitchen NEXIUM 40 MG capsule TAKE ONE CAPSULE BY MOUTH TWICE DAILY  180 capsule  0  . nitroGLYCERIN (NITROSTAT) 0.4 MG SL tablet Place 0.4 mg under the tongue every 5 (five) minutes as needed. For chest pain      . NON FORMULARY CPAP  For obstructive sleep apnea       . NON FORMULARY Dialysis  On Mon Wed Fri epogen 3800units,  infed 50mg ,  zemplar 2 mcg      . Nutritional Supplements (NEPRO) LIQD Take 1 Can by mouth at bedtime.       . ondansetron (ZOFRAN) 4 MG tablet Take 1 tablet (4 mg total) by mouth 2 (two) times daily as needed for nausea.  60 tablet  6  . oxycodone (OXY-IR) 5 MG capsule Take 5 mg by mouth every 4 (four) hours as needed. For pain      . prasugrel (EFFIENT) 10 MG TABS Take 1 tablet (10 mg total) by mouth daily.  30 tablet  8  . promethazine (PHENERGAN) 12.5 MG tablet Take 12.5 mg by mouth every 4 (four) hours as needed. For nausea      . promethazine (PROMETHEGAN) 12.5 MG suppository Place 1 suppository (12.5 mg total) rectally every 6 (six) hours as needed for nausea.  12 each  0  . ranolazine (RANEXA) 500 MG 12 hr tablet Take 500 mg by mouth every evening.       . traMADol (ULTRAM) 50 MG tablet Take 50 mg by mouth 2 (two) times daily.       Marland Kitchen  zolpidem (AMBIEN) 5 MG tablet  Take 5 mg by mouth at bedtime as needed. For insomnia      . [DISCONTINUED] levETIRAcetam (KEPPRA) 250 MG tablet Take 1 tablet (250 mg total) by mouth every 12 (twelve) hours. take in addition to 500mg  bid of Keppra.  60 tablet  11  . [DISCONTINUED] levETIRAcetam (KEPPRA) 500 MG tablet take 1 in the a.m, 1 at night and 1 after every dialysis session  75 tablet  6  . rivastigmine (EXELON) 4.6 mg/24hr Place 1 patch (4.6 mg total) onto the skin daily. Samples of this drug were given to the patient, quantity 4 boxes, Lot [id number]  30 patch  12  . [DISCONTINUED] ondansetron (ZOFRAN) 4 MG tablet TAKE 1 TABLET BY MOUTH TWICE DAILY AS NEEDED FOR NAUSEA  60 tablet  0    PAST MEDICAL HISTORY:   Past Medical History  Diagnosis Date  . SLEEP APNEA, OBSTRUCTIVE     CPAP qhs  . VITAMIN B12 DEFICIENCY   . PARKINSON'S DISEASE   . CORONARY ARTERY DISEASE     s/p  CABG 2003-cath 12/31/09-occluded sap dx  graft, otherwise patent grafts  . Chronic systolic heart failure     LVEF 35-40% by cath 12/31/09 s/p BiV ICD 07/2010  . Gastroparesis   . CORONARY ARTERY BYPASS GRAFT, THREE VESSEL, HX OF 2003  . AUTOMATIC IMPLANTABLE CARDIAC DEFIBRILLATOR SITU 07/2010 BiV ICD  . ANEMIA, MACROCYTIC   . DEPRESSION   . DIABETES MELLITUS, TYPE I   . GERD   . HYPERCHOLESTEROLEMIA   . HYPERTENSION   . RENAL FAILURE, END STAGE     HD MWD a/w anemia of chronic dz, hypotension  . PERIPHERAL NEUROPATHY   . CVA (cerebral vascular accident) 07/2011    L HP and dysphagia, improved  . Seizures     PAST SURGICAL HISTORY:   Past Surgical History  Procedure Date  . Coronary artery bypass graft   . Cataract extraction, bilateral   . Pacemaker placement     permanent  . Dialysis fistula creation   . Shunt replacement   . Doppler echocardiography 2005, 2008, 2010, &2012    SOCIAL HISTORY:   History   Social History  . Marital Status: Married    Spouse Name: N/A    Number of Children: 2  .  Years of Education: N/A   Occupational History  . retired    Social History Main Topics  . Smoking status: Never Smoker   . Smokeless tobacco: Never Used     Comment: Married, lives with spouse, supportive dtr and son nearby. Pt is on dialysis  . Alcohol Use: No  . Drug Use: No  . Sexually Active: Not on file   Other Topics Concern  . Not on file   Social History Narrative   Pt does not get regular exercise.Pt is on dialysis-MWF_since 10/2010Lives with spouse, supportive dtr and son nearby    FAMILY HISTORY:   Family Status  Relation Status Death Age  . Mother Deceased 33    stroke  . Father Deceased 47    Lung Cancer    ROS: Has nausea and throws up virtually every day.  GI w/u unremarkable.   A complete 10 system review of systems was obtained and was unremarkable apart from what is mentioned above.  PHYSICAL EXAMINATION:    VITALS:   Filed Vitals:   12/03/12 1334  BP: 92/50  Pulse: 92  Temp: 97.5 F (36.4 C)  Resp: 18  Weight: 197 lb (89.359 kg)  GEN:  The patient appears stated age and is in NAD. HEENT:  Normocephalic, atraumatic.  The mucous membranes are moist. The superficial temporal arteries are without ropiness or tenderness. CV:  RRR Lungs:  CTAB Neck/HEME:  There are no carotid bruits bilaterally.  Neurological examination:  Orientation:    Able to name objects and repeat phrases but diverts some questions with humor.  Knows date.  Last MMSE 06/2012:  16/30 Cranial nerves: There is good facial symmetry although there is facial hypomimia.  Pupils are pinpoint and minimally reactive.  Funduscopic exam is attempted, but the disc margins are not well visualized bilaterally.  Extraocular muscles are intact, although there is some mild upgaze paresis.  The speech is fluent and clear there is a decrease in spontaneity of speech. Soft palate rises symmetrically and there is no tongue deviation. Hearing is intact to conversational tone. Sensation: Sensation  is intact to light and pinprick throughout (facial, trunk, extremities). Vibration is intact at the bilateral ankle. There is no extinction with double simultaneous stimulation. There is no sensory dermatomal level identified. Motor: Strength is 5/5 in the bilateral upper and lower extremities.  In the past, there has been some asymmetry noted.  There is no pronator drift. Deep tendon reflexes: Deep tendon reflexes are 0/4 at the bilateral biceps, triceps, brachioradialis, patella and achilles. Plantar responses are downgoing bilaterally.  Movement examination: Tone: There is increased tone in the bilateral upper extremities.   Abnormal movements: None Coordination:  There is definite decremation with RAM's, noted in the bilateral upper extremities with hand opening and closing and finger taps. Gait and Station: The patient has  difficulty arising out of a deep-seated chair without the use of the hands. The patient's stride length is decreased with a shuffling gait.  ASSESSMENT:   1.  Parkinsonism.  I am finding is somewhat difficult to get a handle on his diagnosis, primarily because of a very complicated history.  I suspect that the patient had one of the Parkinsonian states, such as frontotemporal dementia or even one of the Parkinson's plus syndromes that did not respond well to carbidopa/levodopa.  Nonetheless, the title for the diagnosis is somewhat academic at this point.  We know that he did not respond to dopaminergic medication.  We know that he somewhat responded to shunt, but also that despite the shunt he is still having difficulties.    -He is going to continue with physical therapy.    -Talked to the patient and his family about safety and reiterated to the patient many times that he was not to drive (he drove on Thanksgiving).   -I talked to them about the fact that I think that the memory loss is likely progressive.  Dr. Modesto Charon had previously discussed this with them as well.  He also  talked to them about potentially adding an acetylcholinesterase inhibitor.  In the end, we decided to try the Exelon patch.  I do not want to try him on Exelon pills because he already has chronic nausea and emesis, almost on a daily basis.  I explained to them that Exelon is indicated for patients with Parkinson's related dementia.  I gave them samples of 4.6 mg patch.  I asked them to approve this with his other physicians before taking it.  They were shown how to use this.  He will change it every 24 hours.  They will put it in a clean/dry location.  He tolerates this in a month, he can go up to  9.5 mg patch.  Risks, benefits, side effects and alternative therapies were discussed.  The opportunity to ask questions was given and they were answered to the best of my ability.  The patient expressed understanding and willingness to follow the outlined treatment protocols. 2.  episodes of confusion.    -Dr. Modesto Charon was treating the patient as if this were seizure, and the Keppra has seemed to help.  We will continue him on this.  -I talked to them again about the fact the Ultram can lower seizure threshold and, if possible, this medication should be discontinued. 3.  Return in about 6 months (around 06/03/2013). 4.  Time in the room with pt/family was 65 min.

## 2012-12-05 ENCOUNTER — Telehealth: Payer: Self-pay | Admitting: *Deleted

## 2012-12-05 NOTE — Telephone Encounter (Signed)
Left msg on vm stating requesting verbal orders to continue seeing pt for 1 x a week for the next 4 weeks. Is this ok,,,/lmb

## 2012-12-05 NOTE — Telephone Encounter (Signed)
Called Katie no answer LMOM md response.../lmb 

## 2012-12-05 NOTE — Telephone Encounter (Signed)
Valentina Gu, It is fine for PT to continue seeing the patient once a week x 4 weeks. Thx! Rene Kocher

## 2012-12-06 ENCOUNTER — Telehealth: Payer: Self-pay | Admitting: Neurology

## 2012-12-06 NOTE — Telephone Encounter (Signed)
I received a voicemail from the patient's daughter, Eunice Blase.  She reported that the patient was on his second day of Exelon patch.  He had fallen twice and she wanted to make sure that this was not a side effect from the Exelon.  I told her that I did not think that this would be a side effect, but that I was very concerned and we need to keep a close eye on this.  If this continues, then we will just discontinued the Exelon.  For now, safety was reviewed.  The patient's daughter expressed understanding and had no further questions.

## 2012-12-09 ENCOUNTER — Telehealth: Payer: Self-pay | Admitting: Gastroenterology

## 2012-12-09 ENCOUNTER — Other Ambulatory Visit: Payer: Self-pay | Admitting: Internal Medicine

## 2012-12-09 MED ORDER — ESOMEPRAZOLE MAGNESIUM 40 MG PO CPDR: 40.0000 mg | DELAYED_RELEASE_CAPSULE | Freq: Two times a day (BID) | ORAL | Status: DC

## 2012-12-09 NOTE — Telephone Encounter (Signed)
Spoke with patient's daughter, ok to do so per chart information.  I advised daughter that patient will needs a follow up visit because it has been one yr and patient has not been seen.. Patient has history of severe GERD and dysphagia.  I advised daughter I will send Nexium as requested for 90 days and I advised daughter patient will have to keep appointment to get further refills.  Daughter verbalized understanding.  Appointment was made for 12-26-12.

## 2012-12-23 ENCOUNTER — Other Ambulatory Visit: Payer: Self-pay | Admitting: Endocrinology

## 2012-12-24 ENCOUNTER — Other Ambulatory Visit: Payer: Self-pay

## 2012-12-24 MED ORDER — PRASUGREL HCL 10 MG PO TABS: 10.0000 mg | ORAL_TABLET | Freq: Every day | ORAL | Status: AC

## 2012-12-26 ENCOUNTER — Ambulatory Visit (INDEPENDENT_AMBULATORY_CARE_PROVIDER_SITE_OTHER): Payer: 59 | Admitting: Gastroenterology

## 2012-12-26 ENCOUNTER — Encounter: Payer: Self-pay | Admitting: Gastroenterology

## 2012-12-26 VITALS — BP 86/40 | HR 100 | Ht 73.0 in | Wt 195.1 lb

## 2012-12-26 DIAGNOSIS — E119 Type 2 diabetes mellitus without complications: Secondary | ICD-10-CM

## 2012-12-26 DIAGNOSIS — N189 Chronic kidney disease, unspecified: Secondary | ICD-10-CM

## 2012-12-26 DIAGNOSIS — Z8669 Personal history of other diseases of the nervous system and sense organs: Secondary | ICD-10-CM

## 2012-12-26 DIAGNOSIS — R1310 Dysphagia, unspecified: Secondary | ICD-10-CM

## 2012-12-26 DIAGNOSIS — Z8719 Personal history of other diseases of the digestive system: Secondary | ICD-10-CM

## 2012-12-26 NOTE — Progress Notes (Signed)
History of Present Illness: This is a extremely complex patient on hemodialysis for chronic renal failure who has insulin-dependent diabetes, peripheral vascular disease, and multiple neurological deficits including neurogenic dysphagia.  He has had previous endoscopy with empiric dilation said did not help any of his GI complaints.  He has acid reflux well controlled with Nexium 40 mg twice a day.  He is on hemodialysis now Monday Wednesday and Friday, insulin, and other multiple medications listed in his chart.  His wife says that he frequently chokes on food and will become cyanotic, she has to self induce removal of fluid from his oropharynx.  Despite these complaints she apparently does maintain a normal weight and denies abdominal pain or any hepatobiliary or lower gastrointestinal symptoms.  He has frequent nausea related to his renal disease and uses when necessary Zofran and Phenergan.  He also has normal pressure hydrocephalus and has had ventricular shunting.Dr.Tat in neurology as recently evaluated this patient does not feel that he has Parkinson's disease.    Current Medications, Allergies, Past Medical History, Past Surgical History, Family History and Social History were reviewed in [employer] record.   Assessment and plan: He obviously has rather severe neurogenic dysphagia, and I've scheduled him for barium swallow with speech pathology.  This is an extremely complex and ill patient with multiple life-threatening medical conditions, and I would suspect limited prognosis.  He may need counseling with primary care and neurology to determine if he needs  PEG placement because of his severe dysphagia.  Hopefully speech pathology will be able to help this patient short of that maneuver.  I've asked him to continue his PPI therapy and other medications as listed and reviewed.  I've instructed his wife in throat clearing mechanisms and Heimlich maneuver.  I doubt we have much  else to offer this patient from a gastroenterology standpoint. Encounter Diagnosis  Name Primary?  Marland Kitchen Dysphagia Yes

## 2012-12-26 NOTE — Patient Instructions (Signed)
You have been scheduled for a modified barium swallow on 12-27-2012 at 1 PM. Please arrive 15 minutes prior to your test for registration. You will go to [facility] Radiology (1st Floor) for your appointment. Please refrain from eating or drinking anything 4 hours prior to your test. Should you need to cancel or reschedule your appointment, please contact [phone] [facility]) or [phone] [facility]). _____________________________________________________________________ A Modified Barium Swallow Study, or MBS, is a special x-ray that is taken to check swallowing skills. It is carried out by a Marine scientist and a Warehouse manager (SLP). During this test, yourmouth, throat, and esophagus, a muscular tube which connects your mouth to your stomach, is checked. The test will help you, your doctor, and the SLP plan what types of foods and liquids are easier for you to swallow. The SLP will also identify positions and ways to help you swallow more easily and safely. What will happen during an MBS? You will be taken to an x-ray room and seated comfortably. You will be asked to swallow small amounts of food and liquid mixed with barium. Barium is a liquid or paste that allows images of your mouth, throat and esophagus to be seen on x-ray. The x-ray captures moving images of the food you are swallowing as it travels from your mouth through your throat and into your esophagus. This test helps identify whether food or liquid is entering your lungs (aspiration). The test also shows which part of your mouth or throat lacks strength or coordination to move the food or liquid in the right direction. This test typically takes 30 minutes to 1 hour to complete. _______________________________________________________________________

## 2012-12-27 ENCOUNTER — Ambulatory Visit (HOSPITAL_COMMUNITY): Payer: 59

## 2013-01-02 ENCOUNTER — Ambulatory Visit (INDEPENDENT_AMBULATORY_CARE_PROVIDER_SITE_OTHER): Payer: 59 | Admitting: Endocrinology

## 2013-01-02 VITALS — BP 134/70 | HR 75 | Temp 97.8°F | Wt 194.0 lb

## 2013-01-02 DIAGNOSIS — E1029 Type 1 diabetes mellitus with other diabetic kidney complication: Secondary | ICD-10-CM

## 2013-01-02 LAB — HEMOGLOBIN A1C: Mean Plasma Glucose: 146 mg/dL — ABNORMAL HIGH (ref <117)

## 2013-01-02 NOTE — Patient Instructions (Addendum)
Please continue humalog, (just before each meal) 10-03-34-15 units (the 15 units is if you eat a bedtime snack).  take extra novolog 5 units as needed for any sugar over 300.  Please schedule a follow-up appointment in 4 months.  continue nph, 45 units units at bedtime only.  check your blood sugar 4 times a day--before the 3 meals, and at bedtime.  also check if you have symptoms of your blood sugar being too high or too low.  please keep a record of the readings and bring it to your next appointment here.  please call us sooner if you are having low blood sugar episode.

## 2013-01-02 NOTE — Progress Notes (Signed)
Subjective:    Patient ID: [name], male    DOB: 09-27-1933, 79 y.o.   MRN: [id number]  HPI Pt returns for f/u of IDDM (dx'ed 1977, complicated by ESRD, retinopathy, gastroparesis, CVA, CAD, and peripheral sensory neuropathy).  he brings a record of his cbg's which i have reviewed today.  It is highest in the afternoon, and in am.  He does not check at hs.  This am, he had a "shaking spell," just after which cbg was 140.  dtr brings a record of her cbg's which i have reviewed today, and says memory loss is limiting pt's glycemic control.  cbg's vary from 150-200.  There is no trend throughout the day. Past Medical History  Diagnosis Date  . SLEEP APNEA, OBSTRUCTIVE     CPAP qhs  . VITAMIN B12 DEFICIENCY   . PARKINSON'S DISEASE   . CORONARY ARTERY DISEASE     s/p  CABG 2003-cath 12/31/09-occluded sap dx  graft, otherwise patent grafts  . Chronic systolic heart failure     LVEF 35-40% by cath 12/31/09 s/p BiV ICD 07/2010  . Gastroparesis   . CORONARY ARTERY BYPASS GRAFT, THREE VESSEL, HX OF 2003  . AUTOMATIC IMPLANTABLE CARDIAC DEFIBRILLATOR SITU 07/2010 BiV ICD  . ANEMIA, MACROCYTIC   . DEPRESSION   . DIABETES MELLITUS, TYPE I   . GERD   . HYPERCHOLESTEROLEMIA   . HYPERTENSION   . RENAL FAILURE, END STAGE     HD MWD a/w anemia of chronic dz, hypotension  . PERIPHERAL NEUROPATHY   . CVA (cerebral vascular accident) 07/2011    L HP and dysphagia, improved  . Seizures     Past Surgical History  Procedure Date  . Coronary artery bypass graft   . Cataract extraction, bilateral   . Pacemaker placement     permanent  . Dialysis fistula creation   . Shunt replacement   . Doppler echocardiography 2005, 2008, 2010, &2012    History   Social History  . Marital Status: Married    Spouse Name: N/A    Number of Children: 2  . Years of Education: N/A   Occupational History  . retired    Social History Main Topics  . Smoking status: Never Smoker   . Smokeless tobacco: Never Used      Comment: Married, lives with spouse, supportive dtr and son nearby. Pt is on dialysis  . Alcohol Use: No  . Drug Use: No  . Sexually Active: Not on file   Other Topics Concern  . Not on file   Social History Narrative   Pt does not get regular exercise.Pt is on dialysis-MWF_since 10/2010Lives with spouse, supportive dtr and son nearby    Current Outpatient Prescriptions on File Prior to Visit  Medication Sig Dispense Refill  . acetaminophen (TYLENOL) 325 MG tablet Take 325 mg by mouth every 4 (four) hours as needed. For pain      . aspirin 81 MG tablet Take 81 mg by mouth daily.        Marland Kitchen atorvastatin (LIPITOR) 20 MG tablet Take 20 mg by mouth daily.        . calcium acetate (PHOSLO) 667 MG capsule 3 (three) times daily. 1 capsule at dinner      . cholecalciferol (VITAMIN D) 400 UNITS TABS Take 1,000 Units by mouth.      . citalopram (CELEXA) 20 MG tablet Take 20 mg by mouth daily.        Marland Kitchen docusate sodium (  COLACE) 100 MG capsule Take 100 mg by mouth 2 (two) times daily as needed. For constipation      . esomeprazole (NEXIUM) 40 MG capsule Take 1 capsule (40 mg total) by mouth 2 (two) times daily.  90 capsule  0  . Ginger, Zingiber officinalis, (GINGER ROOT PO) Take by mouth.      Marland Kitchen glucagon (GLUCAGON EMERGENCY) 1 MG injection Use as directed       . glucose blood (TRUETEST TEST) test strip Use as instructed to check blood sugar four times a day dx 250.41  200 each  3  . HUMULIN N PEN 100 UNIT/ML injection INJECT 45 UNITS SUBCUTANEOUS EVERY NIGHT AT BEDTIME AS DIRECTED  15 mL  0  . hydrOXYzine (ATARAX/VISTARIL) 25 MG tablet TAKE ONE TABLET BY MOUTH IN THE MORNING OF DIALYSIS AND EVERY 6 HOURS AS NEEDED FOR ITCHING SYMPTOMS  60 tablet  3  . insulin lispro (HUMALOG) 100 UNIT/ML injection Inject 10-35 Units into the skin See admin instructions. 4x a day (just before each meal) 10-03-34-15 units (the 15 units is if you eat a bedtime snack).      . Insulin Pen Needle (B-D ULTRAFINE III  SHORT PEN) 31G X 8 MM MISC Use as directed five times daily  600 each  3  . isosorbide mononitrate (IMDUR) 60 MG 24 hr tablet Take 60 mg by mouth daily. On non dialysis days      . Lancet Devices (RA LANCING DEVICE) MISC 1 Device by Does not apply route once.  1 each  0  . Lancets 28G MISC Use as directed to check blood sugar four times daily dx 250.41  200 each  3  . levETIRAcetam (KEPPRA) 250 MG tablet Take 1 tablet (250 mg total) by mouth every 12 (twelve) hours. take in addition to 500mg  bid of Keppra.  60 tablet  5  . levETIRAcetam (KEPPRA) 500 MG tablet take 1 in the a.m, 1 at night and 1 after every dialysis session  75 tablet  6  . metoprolol succinate (TOPROL-XL) 50 MG 24 hr tablet Take one and one-half tabs by mouth once daily  45 tablet  11  . multivitamin (RENA-VIT) TABS tablet Take 1 tablet by mouth daily.        . nitroGLYCERIN (NITROSTAT) 0.4 MG SL tablet Place 0.4 mg under the tongue every 5 (five) minutes as needed. For chest pain      . NON FORMULARY CPAP  For obstructive sleep apnea       . NON FORMULARY Dialysis  On Mon Wed Fri epogen 3800units,  infed 50mg ,  zemplar 2 mcg      . Nutritional Supplements (NEPRO) LIQD Take 1 Can by mouth at bedtime.       . ondansetron (ZOFRAN) 4 MG tablet Take 1 tablet (4 mg total) by mouth 2 (two) times daily as needed for nausea.  60 tablet  6  . oxycodone (OXY-IR) 5 MG capsule Take 5 mg by mouth every 4 (four) hours as needed. For pain      . prasugrel (EFFIENT) 10 MG TABS Take 1 tablet (10 mg total) by mouth daily.  30 tablet  8  . promethazine (PHENERGAN) 12.5 MG tablet Take 12.5 mg by mouth every 4 (four) hours as needed. For nausea      . promethazine (PROMETHEGAN) 12.5 MG suppository Place 1 suppository (12.5 mg total) rectally every 6 (six) hours as needed for nausea.  12 each  0  . ranolazine (  RANEXA) 500 MG 12 hr tablet Take 500 mg by mouth every evening.       . rivastigmine (EXELON) 9.5 mg/24hr Place 1 patch (9.5 mg total) onto  the skin daily.  90 patch  3  . traMADol (ULTRAM) 50 MG tablet Take 50 mg by mouth 2 (two) times daily.       Marland Kitchen zolpidem (AMBIEN) 5 MG tablet Take 5 mg by mouth at bedtime as needed. For insomnia      . rivastigmine (EXELON) 4.6 mg/24hr Place 1 patch (4.6 mg total) onto the skin daily. Samples of this drug were given to the patient, quantity 4 boxes, Lot [id number]  30 patch  12    Allergies  Allergen Reactions  . Nifedipine     Unknown  . Tape     Unknown    Family History  Problem Relation Age of Onset  . Stroke Mother   . Diabetes Father   . Lung cancer Father   . Diabetes Brother   . Stomach cancer Paternal Aunt   . Colon cancer Neg Hx     BP 134/70  Pulse 75  Temp 97.8 F (36.6 C) (Oral)  Wt 194 lb (87.998 kg)  SpO2 98%   Review of Systems denies hypoglycemia    Objective:   Physical Exam Pulses: dorsalis pedis intact bilat, but decreased from normal.   Feet: no deformity.  no ulcer on the feet.  feet are of normal color and temp.  no edema.   Neuro: sensation is intact to touch on the feet, but decreased from normal.  Lab Results  Component Value Date   HGBA1C 6.7* 01/02/2013      Assessment & Plan:  DM: well-controlled

## 2013-01-06 ENCOUNTER — Other Ambulatory Visit: Payer: Self-pay | Admitting: Internal Medicine

## 2013-01-07 ENCOUNTER — Ambulatory Visit (HOSPITAL_COMMUNITY)
Admission: RE | Admit: 2013-01-07 | Discharge: 2013-01-07 | Disposition: A | Discharge status: Home / Self Care | Payer: Medicare Other | Source: Ambulatory Visit | Attending: Gastroenterology | Admitting: Gastroenterology

## 2013-01-07 ENCOUNTER — Telehealth: Payer: Self-pay | Admitting: *Deleted

## 2013-01-07 DIAGNOSIS — R1310 Dysphagia, unspecified: Secondary | ICD-10-CM | POA: Insufficient documentation

## 2013-01-07 NOTE — Telephone Encounter (Signed)
Called Katie no answer LMOM md response...Raechel Chute

## 2013-01-07 NOTE — Procedures (Signed)
Objective Swallowing Evaluation: Modified Barium Swallowing Study  Patient Details  Name: [name] MRN: [id number] Date of Birth: 1933-02-12  Today's Date: 01/07/2013 Time: 1301-1402 SLP Time Calculation (min): 61 min  Past Medical History:  Past Medical History  Diagnosis Date  . SLEEP APNEA, OBSTRUCTIVE     CPAP qhs  . VITAMIN B12 DEFICIENCY   . PARKINSON'S DISEASE   . CORONARY ARTERY DISEASE     s/p  CABG 2003-cath 12/31/09-occluded sap dx  graft, otherwise patent grafts  . Chronic systolic heart failure     LVEF 35-40% by cath 12/31/09 s/p BiV ICD 07/2010  . Gastroparesis   . CORONARY ARTERY BYPASS GRAFT, THREE VESSEL, HX OF 2003  . AUTOMATIC IMPLANTABLE CARDIAC DEFIBRILLATOR SITU 07/2010 BiV ICD  . ANEMIA, MACROCYTIC   . DEPRESSION   . DIABETES MELLITUS, TYPE I   . GERD   . HYPERCHOLESTEROLEMIA   . HYPERTENSION   . RENAL FAILURE, END STAGE     HD MWD a/w anemia of chronic dz, hypotension  . PERIPHERAL NEUROPATHY   . CVA (cerebral vascular accident) 07/2011    L HP and dysphagia, improved  . Seizures    Past Surgical History:  Past Surgical History  Procedure Date  . Coronary artery bypass graft   . Cataract extraction, bilateral   . Pacemaker placement     permanent  . Dialysis fistula creation   . Shunt replacement   . Doppler echocardiography 2005, 2008, 2010, &2012   HPI:  79 yo male referred by Dr Jarold Motto for MBS due to concerns pt may be aspirating solid foods.  Family present and reports pt has choked on food and required Heimlich manuever as well as becoming cyanotic x3 since September 2013 - two episodes January 2014.  GI MD suspects pt has a severe neurogenic dysphagia.  Pt without weight loss nor pulmonary infections per family.  Pt denies problems swalllowing pills and takes multiple pills at one time.        Assessment / Plan / Recommendation Clinical Impression  Dysphagia Diagnosis: Moderate pharyngeal phase dysphagia;Mild cervical esophageal phase  dysphagia;Moderate oral phase dysphagia   Clinical impression: Pt presents with moderate oropharyngeal dysphagia with suspected decreased intraoral bolus pressure and intermittent discoordination of oral transfer.  Vallecular stasis noted mostly with solid/pudding without pt awareness/sensation.  Head turn left appeared to prevent severe vallecular stasis, but difficult to definitively determine with inconsistent swallow response.  Dry swallows and liquid swallows also helpful to decr pharyngeal accumulation.  Trace aspiration noted with thin x1 of approx 9 boluses and cleared adequately with reflexive cough.   Suspect pt's dysphagia is chronic given h/o dysphagia (previous MBS studies) from his neurogenic condition with exacerbations from multiple comorbidities.    Pt's symptoms of coughing with solids possibly secondary to aspiration from vallecular residuals to which he is not aware.  Thoroughly advised family to strict aspiration precautions/compensatory strategies/adaptive equipment/ diet modficiations to minimize amount of aspiration.    Provale bolus flow cup (5 cc or 1 cc liquid released) would decrease amount of liquids pt receives in each bolus and may decrease his aspiration.   Pt tends to take large boluses of liquids and consume at rapid rate per observation and family statement.    Family admits pt tolerates intake better if he eats slowly.  Further advised family to modify diet to pureed if he consistently has problems with solids and to purchase thickener to use in drinks as needed if prevents cough and increases  pt's comfort with po.     Highly recommend pt have SLP follow up (? OP) for oropharyngeal exercises to maximize swallow function via incorporation of strengthening exercises and generalization of swallow strategies for airway protection.     Treatment Recommendation    Outpt SLP   Diet Recommendation Dysphagia 3 (Mechanical Soft);Thin liquid;Nectar-thick liquid   Liquid  Administration via: Cup;Straw Medication Administration: Whole meds with puree Supervision: Full supervision/cueing for compensatory strategies Compensations: Check for pocketing;Small sips/bites;Follow solids with liquid;Multiple dry swallows after each bite/sip Postural Changes and/or Swallow Maneuvers: Head turn left during swallow;Seated upright 90 degrees;Upright 30-60 min after meal (several small meals throughout the day)    Other  Recommendations Oral Care Recommendations: Oral care before and after PO Other Recommendations: Order thickener from pharmacy (order thickener for prn use)   Follow Up Recommendations  Outpatient SLP           SLP Swallow Goals  n/a   General HPI: 79 yo male referred by Dr Jarold Motto for MBS due to concerns pt may be aspirating solid foods.  Family present and reports pt has choked on food and required Heimlich manuever as well as becoming cyanotic x3 since September 2013 - two episodes January 2014.  GI MD suspects pt has a severe neurogenic dysphagia.  Pt without weight loss nor pulmonary infections per family.  Pt denies problems swalllowing pills and takes multiple pills at one time.    Type of Study: Modified Barium Swallowing Study Previous Swallow Assessment: MBS June 2012:  regular/thin with chin tuck, following solids with liquids, dry swallows, eliminating distractions/conversation during meals.  EGD July 2012 with proximal dilatation per family - slp did not locate report in EPIC.  Previous endoscopic eval Aug 2008- gastritis with rec for Aciphex  Diet Prior to this Study: Regular;Thin liquids Temperature Spikes Noted: No Respiratory Status: Room air History of Recent Intubation: No Behavior/Cognition: Alert;Cooperative;Decreased sustained attention (delayed responses) Oral Cavity - Dentition: Adequate natural dentition Oral Motor / Sensory Function: Within functional limits Self-Feeding Abilities: Able to feed self Patient Positioning:  Upright in chair Baseline Vocal Quality: Clear Volitional Cough: Strong Volitional Swallow: Able to elicit Anatomy: Within functional limits (appearance of osteophyte at C5-C6) Pharyngeal Secretions: Not observed secondary MBS    Reason for Referral   concern for aspiration  Oral Phase Oral Preparation/Oral Phase Oral Phase: Impaired Oral - Nectar Oral - Nectar Cup: Delayed oral transit Oral - Thin Oral - Thin Teaspoon: Delayed oral transit Oral - Thin Cup: Delayed oral transit Oral - Thin Straw: Delayed oral transit Oral - Solids Oral - Puree: Within functional limits Oral - Regular: Impaired mastication (residual in anterior portion of mouth without awareness) Oral - Pill: Within functional limits Oral Phase - Comment Oral Phase - Comment: pt takes large boluses of liquids:  delayed transiting noted  suspect decr intra oral pressure present    Pharyngeal Phase Pharyngeal Phase Pharyngeal Phase: Impaired Pharyngeal - Nectar Pharyngeal - Nectar Cup: Within functional limits Pharyngeal - Thin Pharyngeal - Thin Teaspoon: Premature spillage to pyriform sinuses Pharyngeal - Thin Cup: Trace aspiration;Pharyngeal residue - valleculae;Penetration/Aspiration during swallow;Reduced tongue base retraction Penetration/Aspiration details (thin cup): Material enters airway, passes BELOW cords then ejected out Pharyngeal - Thin Straw: Pharyngeal residue - valleculae (chin tuck did not decr vallecular stasis significantly) Pharyngeal - Solids Pharyngeal - Puree: Delayed swallow initiation;Pharyngeal residue - valleculae (head turn left, dry swallow, liquid swallow) Pharyngeal - Regular: Pharyngeal residue - valleculae (head turn left, chin tuck, dry swallows)  Pharyngeal - Pill: Within functional limits  Cervical Esophageal Phase    GO    Cervical Esophageal Phase Cervical Esophageal Phase: Impaired Cervical Esophageal Phase - Nectar Nectar Cup: Prominent cricopharyngeal  segment Cervical Esophageal Phase - Thin Thin Teaspoon: Prominent cricopharyngeal segment;Reduced cricopharyngeal relaxation (trace stasis at UES, appeared mixed with secretions) Thin Cup: Prominent cricopharyngeal segment;Esophageal backflow into cervical esophagus;Reduced cricopharyngeal relaxation (trace backflow) Thin Straw: Prominent cricopharyngeal segment;Reduced cricopharyngeal relaxation (trace stasis at UES, appeared mixed with secretion) Cervical Esophageal Phase - Solids Clearance of barium through UES appeared better than on previous MBS in June 2012.   Puree: Within functional limits Regular: Within functional limits Pill: Within functional limits    Functional Assessment Tool Used: MBS, clinical judgement Functional Limitations: Swallowing Swallow Current Status (O1308): At least 20 percent but less than 40 percent impaired, limited or restricted Swallow Goal Status [phone]): At least 20 percent but less than 40 percent impaired, limited or restricted Swallow Discharge Status [phone]): At least 20 percent but less than 40 percent impaired, limited or restricted    Donavan Burnet, MS [facility] SLP [phone]

## 2013-01-07 NOTE — Telephone Encounter (Signed)
Requesting verbal order to continue seeing pt 1 time a week for 3 weeks. Due to pt having dialysis haven't been able to see him much. Pt is wanting therapy continue....Raechel Chute

## 2013-01-07 NOTE — Telephone Encounter (Signed)
verbal ok to continue 3x/wk

## 2013-01-13 DIAGNOSIS — R112 Nausea with vomiting, unspecified: Secondary | ICD-10-CM

## 2013-01-13 DIAGNOSIS — M6281 Muscle weakness (generalized): Secondary | ICD-10-CM

## 2013-01-13 DIAGNOSIS — IMO0001 Reserved for inherently not codable concepts without codable children: Secondary | ICD-10-CM

## 2013-01-13 DIAGNOSIS — E1029 Type 1 diabetes mellitus with other diabetic kidney complication: Secondary | ICD-10-CM

## 2013-01-16 ENCOUNTER — Other Ambulatory Visit: Payer: Self-pay | Admitting: Gastroenterology

## 2013-01-27 ENCOUNTER — Other Ambulatory Visit: Payer: Self-pay | Admitting: Internal Medicine

## 2013-01-27 ENCOUNTER — Other Ambulatory Visit: Payer: Self-pay | Admitting: Endocrinology

## 2013-01-30 ENCOUNTER — Other Ambulatory Visit: Payer: Self-pay | Admitting: Internal Medicine

## 2013-01-30 ENCOUNTER — Ambulatory Visit (INDEPENDENT_AMBULATORY_CARE_PROVIDER_SITE_OTHER): Payer: 59 | Admitting: Internal Medicine

## 2013-01-30 ENCOUNTER — Encounter: Payer: Self-pay | Admitting: Internal Medicine

## 2013-01-30 VITALS — BP 110/52 | HR 85 | Temp 97.0°F | Wt 192.8 lb

## 2013-01-30 DIAGNOSIS — R627 Adult failure to thrive: Secondary | ICD-10-CM

## 2013-01-30 DIAGNOSIS — R269 Unspecified abnormalities of gait and mobility: Secondary | ICD-10-CM

## 2013-01-30 DIAGNOSIS — I69991 Dysphagia following unspecified cerebrovascular disease: Secondary | ICD-10-CM

## 2013-01-30 DIAGNOSIS — I69391 Dysphagia following cerebral infarction: Secondary | ICD-10-CM | POA: Insufficient documentation

## 2013-01-30 DIAGNOSIS — G40909 Epilepsy, unspecified, not intractable, without status epilepticus: Secondary | ICD-10-CM

## 2013-01-30 DIAGNOSIS — N186 End stage renal disease: Secondary | ICD-10-CM

## 2013-01-30 MED ORDER — CITALOPRAM HYDROBROMIDE 20 MG PO TABS: 20.0000 mg | ORAL_TABLET | Freq: Every day | ORAL | Status: AC

## 2013-01-30 MED ORDER — ONDANSETRON HCL 4 MG PO TABS: 4.0000 mg | ORAL_TABLET | Freq: Two times a day (BID) | ORAL | Status: AC | PRN

## 2013-01-30 NOTE — Progress Notes (Signed)
Subjective:    Patient ID: [name], male    DOB: 25-Oct-1933, 79 y.o.   MRN: [id number]  HPI  Here for follow up - reviewed chronic medical issues:  chronic nausea - controls with zofran bid and prometh supp prn -needs refills  CVA with L HP and dysphagia 07/2011, s/p SNF rehab for same - home with family since 09/08/11 -continued gait disorder working with home therapy, choking events with dysphasia reevaluated January 2014 barium swallow -feeling in patient declines reconsideration of PEG  Seizures - dx fall 2012 after neuro eval for "AMS" and "unresponsive spells" On Keppra and imporved symptoms with fewer recurrences -  No incontinence, no seziure motor activity during any spells -  Gait disorder and weakness, falls at home with reluctance to use walker -  nsurg eval 07/19/11: head ct ok (no recurrent NPH change since VP shunt 08/2009) and repeat Nsurg eval 09/2012 hosp unremarkable no MRI due to PPM Still walking "bent over and not picking feet up" -    Past Medical History  Diagnosis Date  . SLEEP APNEA, OBSTRUCTIVE     CPAP qhs  . VITAMIN B12 DEFICIENCY   . PARKINSON'S DISEASE   . CORONARY ARTERY DISEASE     s/p  CABG 2003-cath 12/31/09-occluded sap dx  graft, otherwise patent grafts  . Chronic systolic heart failure     LVEF 35-40% by cath 12/31/09 s/p BiV ICD 07/2010  . Gastroparesis   . CORONARY ARTERY BYPASS GRAFT, THREE VESSEL, HX OF 2003  . AUTOMATIC IMPLANTABLE CARDIAC DEFIBRILLATOR SITU 07/2010 BiV ICD  . ANEMIA, MACROCYTIC   . DEPRESSION   . DIABETES MELLITUS, TYPE I   . GERD   . HYPERCHOLESTEROLEMIA   . HYPERTENSION   . RENAL FAILURE, END STAGE     HD MWD a/w anemia of chronic dz, hypotension  . PERIPHERAL NEUROPATHY   . CVA (cerebral vascular accident) 07/2011    L HP and dysphagia, improved  . Seizures    Review of Systems  Constitutional: Positive for fatigue. Negative for fever and unexpected weight change.  Respiratory: Negative for shortness of  breath.   Cardiovascular: Negative for chest pain and leg swelling.  Gastrointestinal: Negative for abdominal pain, diarrhea, constipation and blood in stool.  Neurological: Negative for dizziness and facial asymmetry.       Objective:   Physical Exam  BP 110/52  Pulse 85  Temp 97 F (36.1 C) (Oral)  Wt 192 lb 12.8 oz (87.454 kg)  SpO2 97% Wt Readings from Last 3 Encounters:  01/30/13 192 lb 12.8 oz (87.454 kg)  01/02/13 194 lb (87.998 kg)  12/26/12 195 lb 2 oz (88.508 kg)   Constitutional: sitting in WC, but walks with cane asst -he appears well-developed and well-nourished. No distress. Wife and son at side Neck: Normal range of motion. Neck supple. No JVD present. No thyromegaly present.  Cardiovascular: Normal rate, regular rhythm and normal heart sounds.  No murmur heard. No BLE edema. Pacer pocket right anterior chest with very mild bruising, no swelling or hematoma  Pulmonary/Chest: Effort normal and breath sounds normal. No respiratory distress. no wheezes.  MSkel: no gross deformities, no effusions Neuro: AAOx2, CN2-12 symmetrically intact; MAE well - no detectable L HP or dysarthria Psyc: reserved affect, flat and withdrawn mood      Lab Results  Component Value Date   WBC 4.4 10/01/2012   HGB 11.1* 10/01/2012   HCT 31.3* 10/01/2012   PLT 148* 10/01/2012   CHOL 131  08/09/2011   TRIG 193* 08/09/2011   HDL 30* 08/09/2011   LDLDIRECT 68.2 07/26/2010   ALT 17 09/30/2012   AST 18 09/30/2012   NA 137 10/01/2012   K 4.2 10/01/2012   CL 95* 10/01/2012   CREATININE 8.83* 10/01/2012   BUN 47* 10/01/2012   CO2 25 10/01/2012   TSH 1.296 08/15/2010   INR 1.14 08/09/2011   HGBA1C 6.7* 01/02/2013     Assessment & Plan:  See problem list. Medications and labs reviewed today.   Weakness, chronic and multifactorial, rapidly progressive physical endurance and cognitive decline late 2013 - likely related to underlying dementia as well as numerous chronic medical issues: hypotension,  dialysis, meds - ? PK dz - encouraged review of same with neuro Continue orthostatic precautions, not to go unsupervised - meds for blood pressure as per dialysis providers and if SBP<100 in AM, hold toprol  Time spent with pt/family today 25 minutes, greater than 50% time spent counseling patient on FTT, weakness, gait disorder, nausea and medication review. Also review of prior records

## 2013-01-30 NOTE — Assessment & Plan Note (Signed)
AMS > seizure events in 2012 - now on Keppra -  event 07/2011 clinic associated with hypoglycemia (50s) but other events since without hypoglycemia.  unable to have MRI due to PPM - ongoing OP neuro tx, no driving  Also note prior ?dx of Pk pre 2009 - then changed dx to NPH and underwent VP shunt 08/2009 for same with seeming temporary improvement No recent breakthrough events, continue same medications

## 2013-01-30 NOTE — Patient Instructions (Signed)
It was good to see you today. We have reviewed your interval records including labs and tests today Medications reviewed and updated, no changes at this time.  Refill on medication(s) as discussed today. Remember to eat slow, take small bites, use thickener as directed by speech therapy to avoid choking episodes we'll [facility] and rolling walker as requested . Our office will contact you regarding appointment(s) once made. Please schedule followup in 4-6 months, call sooner if problems. Dysphagia Diet Level 3, Advanced This dysphagia advanced diet is restricted to:  Foods that are moist, with "bite-size" pieces.   Foods that are not hard, sticky, or crunchy.   Well-moistened breads with extra margarine, jelly, and syrup. No dry bread such as toast or crackers.   Foods that do not include nuts, seeds, dry fruit, coconut, or pineapple.   Vegetables that are less than 1 inch in size. No cooked corn or non-tender, rubbery cooked vegetables such as broccoli, cabbage, Brussels sprouts, asparagus, or cauliflower.  FOOD TEXTURES Beverages  Recommended: Any beverages, depending on recommendations for liquid consistency.   You are currently limited to one of the following liquid consistency levels:   Thin.   Nectar-like.   Honey-like.   Spoon-thick.  Breads  Recommended: Any well-moistened breads, biscuits, muffins, pancakes, and waffles. Need to add adequate syrup, jelly, margarine, or butter to moisten well.   Avoid: Dry bread, toast, and crackers. Tough, crusty breads such as [place] bread or baguettes.  Cereals  Recommended: Cooked cereals with little texture, including oatmeal. Unprocessed wheat bran stirred into cereals for bulk. If thin liquids are restricted, it is important that all of the liquid is absorbed into the cereal.   Avoid: All dry cereals and any cooked cereals that may contain flax seeds or other seeds or nuts.  Whole-grain, dry, or coarse cereals. Cereals with nuts, seeds, dried fruit, or coconut.  Desserts  Recommended: All except those listed to avoid.   Avoid: Dry cakes or cookies that are chewy or very dry. Anything with nuts, seeds, coconut, pineapple, or dried fruit.   These foods are considered thin liquids and should be avoided if thin liquids are restricted:   Frozen malts, milk shakes, frozen yogurt, eggnog, nutritional supplements, ice cream, sherbet, regular or sugar-free gelatin, or any foods that become thin liquid at either room temperature, 70 F (21.1 C) or body temperature, 98 F (36.7 C).  Fats  Recommended: All except those listed to avoid.   Avoid: All fats with coarse, difficult-to-chew, or chunky additives such as cream cheese spread with nuts or pineapple.  Fruits  Recommended: All canned and cooked fruits. Soft, peeled fresh fruits such as peaches, nectarines, kiwi, mangoes, cantaloupe, and honeydew. Soft berries with small seeds such as strawberries.   Avoid: Difficult-to-chew fresh fruits such as apples or pears. Stringy, high-pulp fruits such as papaya, pineapple, or mango. Fresh fruits with difficult-to-chew peels, such as grapes. Uncooked dried fruits such as prunes and apricots. Fruit leather, fruit snacks, and dried fruits.  Meats and Meat Substitutes Meat should be chopped into -inch to -inch pieces.  Recommended: Thin-sliced, tender, or ground meats and poultry. Well-moistened fish. Eggs prepared any way. Yogurt without nuts or coconut. Casseroles with small chunks of meat, ground meats, or tender meats.   Avoid: Tough, dry meats and poultry. Dry fish or fish with bones. Chunky peanut butter. Yogurt with nuts or coconut.  Potatoes and Starches  Recommended: All, including rice, wild rice, moist  bread dressing, and tender, fried potatoes.   Avoid: Tough, crisp fried potatoes. Potato skins. Dry bread dressing.  Soups  Recommended: All soups except those  listed to avoid. Strained corn or clam chowder. Soups will need to be thickened to appropriate consistency, if soup is thinner than prescribed liquid consistency.   Avoid: Soups with tough meats. Corn or clam chowders. Soups with large chunks of meat and vegetables that are bigger than 1 inch.  Vegetables   Recommended: All cooked, tender vegetables. Shredded lettuce.   Avoid: All raw vegetables except shredded lettuce. Cooked corn. Non-tender or rubbery cooked vegetables.  Miscellaneous  Recommended: All seasonings and sweeteners. All sauces. Non-chewy candies without nuts, seeds, or coconut. Jams, jellies, honey, and preserves.   Avoid: Seeds, nuts, and coconut. Chewy caramel or taffy candies. Candies with nuts, seeds, or coconut.  Document Released: 12/11/2005 Document Revised: 03/04/2012 Document Reviewed: 09/10/2007 [facility] [place] [facility] Patient Information 2013 [place], [place].

## 2013-01-30 NOTE — Assessment & Plan Note (Signed)
HD MWF - per renal Wife to notify them as well as [place] if continued problems with hypotension causing "weakness"

## 2013-01-30 NOTE — Assessment & Plan Note (Signed)
Chronic failure to thrive, multifactorial related to chronic disease illnesses Currently at baseline, continue physical therapy through home care as tolerated

## 2013-01-30 NOTE — Assessment & Plan Note (Signed)
Evaluation by GI and speech therapy December 2013 in January 2014 reviewed Barium swallow: Severe dysplasia noted, recommendation for dysphagia 3 diet Family and patient adamantly refused consideration of PEG We reviewed risk of aspiration and pneumonia with choking events, understand and accept risk to continue taking food by mouth

## 2013-01-31 ENCOUNTER — Telehealth: Payer: Self-pay | Admitting: Internal Medicine

## 2013-01-31 NOTE — Telephone Encounter (Signed)
Called Katie no answer LMOM with regina response...lmb

## 2013-01-31 NOTE — Telephone Encounter (Signed)
Katie a PT with [facility] is wanting to get a verbal order to continue PT 1 day a week for 8 weeks, call her at [phone]

## 2013-01-31 NOTE — Telephone Encounter (Signed)
Ok to continue PT [employer]

## 2013-02-17 ENCOUNTER — Ambulatory Visit (INDEPENDENT_AMBULATORY_CARE_PROVIDER_SITE_OTHER): Payer: 59 | Admitting: *Deleted

## 2013-02-17 ENCOUNTER — Encounter: Payer: Self-pay | Admitting: Internal Medicine

## 2013-02-17 ENCOUNTER — Other Ambulatory Visit: Payer: Self-pay | Admitting: Internal Medicine

## 2013-02-17 ENCOUNTER — Telehealth: Payer: Self-pay | Admitting: *Deleted

## 2013-02-17 DIAGNOSIS — I5022 Chronic systolic (congestive) heart failure: Secondary | ICD-10-CM

## 2013-02-17 DIAGNOSIS — Z95810 Presence of automatic (implantable) cardiac defibrillator: Secondary | ICD-10-CM

## 2013-02-17 NOTE — Telephone Encounter (Signed)
Lou received fax wanting to know would this pt benefit from the [facility]...Raechel Chute

## 2013-02-17 NOTE — Telephone Encounter (Signed)
Called [name] back she states we have to call rochelle or michelle to get pt set up @ [phone]. Called spoke with  Marcelino Duster she states will fax over [facility] form to complete and once they have been approve md will be notified...Raechel Chute

## 2013-02-17 NOTE — Telephone Encounter (Signed)
yes

## 2013-02-25 LAB — REMOTE ICD DEVICE
AL IMPEDENCE ICD: 456 Ohm
AL THRESHOLD: 1.375 V
BAMS-0001: 170 {beats}/min
BATTERY VOLTAGE: 3.0119 V
CHARGE TIME: 10.64 s
FVT: 0
LV LEAD THRESHOLD: 1 V
PACEART VT: 0
RV LEAD AMPLITUDE: 19.1 mv
TOT-0001: 1
TOT-0002: 0
TZAT-0001ATACH: 2
TZAT-0004SLOWVT: 8
TZAT-0004SLOWVT: 8
TZAT-0005SLOWVT: 88 pct
TZAT-0005SLOWVT: 91 pct
TZAT-0011SLOWVT: 10 ms
TZAT-0012ATACH: 150 ms
TZAT-0012ATACH: 150 ms
TZAT-0012FASTVT: 200 ms
TZAT-0012SLOWVT: 200 ms
TZAT-0012SLOWVT: 200 ms
TZAT-0018ATACH: NEGATIVE
TZAT-0018FASTVT: NEGATIVE
TZAT-0020ATACH: 1.5 ms
TZAT-0020ATACH: 1.5 ms
TZAT-0020FASTVT: 1.5 ms
TZAT-0020SLOWVT: 1.5 ms
TZAT-0020SLOWVT: 1.5 ms
TZON-0003ATACH: 350 ms
TZON-0003SLOWVT: 340 ms
TZON-0003VSLOWVT: 400 ms
TZON-0004SLOWVT: 16
TZON-0004VSLOWVT: 20
TZST-0001ATACH: 4
TZST-0001ATACH: 6
TZST-0001FASTVT: 2
TZST-0001FASTVT: 6
TZST-0001SLOWVT: 5
TZST-0002FASTVT: NEGATIVE
TZST-0002FASTVT: NEGATIVE
TZST-0002FASTVT: NEGATIVE
TZST-0003SLOWVT: 20 J
TZST-0003SLOWVT: 35 J
VENTRICULAR PACING ICD: 99.95 pct

## 2013-03-03 ENCOUNTER — Other Ambulatory Visit: Payer: Self-pay | Admitting: Internal Medicine

## 2013-03-03 ENCOUNTER — Other Ambulatory Visit: Payer: Self-pay | Admitting: *Deleted

## 2013-03-03 ENCOUNTER — Other Ambulatory Visit: Payer: Self-pay | Admitting: Gastroenterology

## 2013-03-03 MED ORDER — RENA-VITE PO TABS: 1.0000 | ORAL_TABLET | Freq: Every day | ORAL | Status: AC

## 2013-03-05 ENCOUNTER — Encounter: Payer: Self-pay | Admitting: *Deleted

## 2013-03-07 NOTE — Progress Notes (Signed)
PT evaluation Addendum - late entry for G-codes   10/20/12 05/21/01  PT G-Codes **NOT FOR INPATIENT CLASS**  Functional Assessment Tool Used clinical judgement  Functional Limitation Mobility: Walking and moving around  Mobility: Walking and Moving Around Current Status (G9562) CJ  Mobility: Walking and Moving Around Goal Status (Z3086) CI  PT General Charges  $$ ACUTE PT VISIT 1 Procedure  PT Evaluation  $Initial PT Evaluation Tier I 1 Procedure  PT Treatments  $Gait Training 8-22 mins  $Therapeutic Activity 8-22 mins  03/07/2013 Corlis Hove, PT [id number]

## 2013-03-10 ENCOUNTER — Other Ambulatory Visit: Payer: Self-pay | Admitting: Endocrinology

## 2013-04-21 ENCOUNTER — Other Ambulatory Visit: Payer: Self-pay | Admitting: Gastroenterology

## 2013-04-29 ENCOUNTER — Telehealth: Payer: Self-pay

## 2013-04-29 NOTE — Telephone Encounter (Signed)
Dentist called stating pt has unilateral submandibular swelling. Pt is not having any pain and mass is movable but dentist is requesting VAL evaluate. Pt's spouse contacted and transferred to schedule appt.

## 2013-05-06 ENCOUNTER — Ambulatory Visit: Payer: 59 | Admitting: Internal Medicine

## 2013-05-06 ENCOUNTER — Ambulatory Visit (INDEPENDENT_AMBULATORY_CARE_PROVIDER_SITE_OTHER): Payer: 59 | Admitting: Internal Medicine

## 2013-05-06 ENCOUNTER — Encounter: Payer: Self-pay | Admitting: Internal Medicine

## 2013-05-06 ENCOUNTER — Ambulatory Visit: Payer: 59 | Admitting: Endocrinology

## 2013-05-06 VITALS — BP 100/58 | HR 102 | Temp 97.9°F | Wt 193.4 lb

## 2013-05-06 DIAGNOSIS — R21 Rash and other nonspecific skin eruption: Secondary | ICD-10-CM

## 2013-05-06 DIAGNOSIS — L259 Unspecified contact dermatitis, unspecified cause: Secondary | ICD-10-CM

## 2013-05-06 DIAGNOSIS — N186 End stage renal disease: Secondary | ICD-10-CM

## 2013-05-06 DIAGNOSIS — K1120 Sialoadenitis, unspecified: Secondary | ICD-10-CM

## 2013-05-06 NOTE — Assessment & Plan Note (Signed)
HD MWF - per renal Wife to notify them re: temporarily ease of fluid restrictions for hydration

## 2013-05-06 NOTE — Progress Notes (Signed)
Subjective:    Patient ID: [name], male    DOB: April 01, 1933, 79 y.o.   MRN: [id number]  HPI  Patient presents to the office today with his wife and daughter with a CC of a mass near the jaw.  Patient went to the dentist last Tuesday, and dentist suspected that the mass was a blocked salivary gland.  Patient was instructed to suck on sour candy, but did not like the taste so he has not done this at home.  Patient has had no prior dental problems.  Patient and family deny fever chills, purulent drainage, redness, warmth, and increased difficulty swallowing from baseline.  Patient does not have dentures or dental implants.    Patient's wife also complains of rash in the groin region.  She states that she has been using Desitin at home with mild relief.  There is no drainage, abnormal warmth, or spreading of the rash that the family has noticed.  Wife states she thinks this is likely due to friction from the adult briefs the patient wears.  Past Medical History  Diagnosis Date  . SLEEP APNEA, OBSTRUCTIVE     CPAP qhs  . VITAMIN B12 DEFICIENCY   . PARKINSON'S DISEASE   . CORONARY ARTERY DISEASE     s/p  CABG 2003-cath 12/31/09-occluded sap dx  graft, otherwise patent grafts  . Chronic systolic heart failure     LVEF 35-40% by cath 12/31/09 s/p BiV ICD 07/2010  . Gastroparesis   . CORONARY ARTERY BYPASS GRAFT, THREE VESSEL, HX OF 2003  . AUTOMATIC IMPLANTABLE CARDIAC DEFIBRILLATOR SITU 07/2010 BiV ICD  . ANEMIA, MACROCYTIC   . DEPRESSION   . DIABETES MELLITUS, TYPE I   . GERD   . HYPERCHOLESTEROLEMIA   . HYPERTENSION   . RENAL FAILURE, END STAGE     HD MWD a/w anemia of chronic dz, hypotension  . PERIPHERAL NEUROPATHY   . CVA (cerebral vascular accident) 07/2011    L HP and dysphagia, improved  . Seizures    Family History  Problem Relation Age of Onset  . Stroke Mother   . Diabetes Father   . Lung cancer Father   . Diabetes Brother   . Stomach cancer Paternal Aunt   . Colon cancer  Neg Hx        Review of Systems  Constitutional: Negative for fever, chills and fatigue.  HENT: Positive for drooling and trouble swallowing. Negative for congestion, rhinorrhea, neck pain, neck stiffness, dental problem and voice change.   Respiratory: Negative for apnea, cough, chest tightness, shortness of breath, wheezing and stridor.   Cardiovascular: Negative for chest pain, palpitations and leg swelling.  Gastrointestinal: Negative for nausea and vomiting.  Skin: Positive for rash. Negative for pallor and wound.  All other systems reviewed and are negative.       Objective:   Physical Exam  Nursing note and vitals reviewed. Constitutional: He is oriented to person, place, and time. He appears well-developed and well-nourished. No distress.  HENT:  Head: Normocephalic and atraumatic. No trismus in the jaw.    Mouth/Throat: Oropharynx is clear and moist and mucous membranes are normal. He does not have dentures. No oral lesions. Normal dentition. No dental abscesses, edematous, lacerations or dental caries.  No visible blockage of the salivary duct.  No expression of saliva with palpation of the duct.  No tenderness to palpation in the sublingual area.    Eyes: Conjunctivae are normal. No scleral icterus.  Cardiovascular: Normal rate, regular  rhythm and normal heart sounds.  Exam reveals no gallop and no friction rub.   No murmur heard. Pulmonary/Chest: Effort normal and breath sounds normal. No respiratory distress. He has no wheezes. He has no rales. He exhibits no tenderness.  Neurological: He is alert and oriented to person, place, and time.  Skin: Skin is warm and dry. He is not diaphoretic.  Erythematous macular rash located in the crural folds.  No noted purpura, petechia, or discharge.  Psychiatric:  Flat affect, with minimal speech.     Lab Results  Component Value Date   WBC 4.4 10/01/2012   HGB 11.1* 10/01/2012   HCT 31.3* 10/01/2012   PLT 148* 10/01/2012    GLUCOSE 186* 10/01/2012   CHOL 131 08/09/2011   TRIG 193* 08/09/2011   HDL 30* 08/09/2011   LDLDIRECT 68.2 07/26/2010   LDLCALC 62 08/09/2011   ALT 17 09/30/2012   AST 18 09/30/2012   NA 137 10/01/2012   K 4.2 10/01/2012   CL 95* 10/01/2012   CREATININE 8.83* 10/01/2012   BUN 47* 10/01/2012   CO2 25 10/01/2012   TSH 1.296 08/15/2010   INR 1.14 08/09/2011   HGBA1C 6.7* 01/02/2013         Assessment & Plan:  Patient presents to the office today with "mass" near jaw line:  1.  "Mass":  Symptoms and physical exam very consistent with Sialoadenitis.  Patient and family instructed to have the patient suck on any type of candy that he find palatable.  Warm compresses to the area will also be helpful.  Patient given strict return precautions.  Symptoms should be resolved in the next two weeks.  Patient and his family to call if symptoms do not resolve or sooner if worse.  2.  Crural rash:  Patient instructed that this is likely contact dermatitis.  Patient prescribed barrier cream to use as needed.         I have personally reviewed this case with PA student. I also personally examined this patient. I agree with history and findings as documented above. I reviewed, discussed and approve of the assessment and plan as listed above. Rene Paci, MD

## 2013-05-06 NOTE — Progress Notes (Signed)
  Subjective:    Patient ID: [name], male    DOB: July 16, 1933, 79 y.o.   MRN: [id number]  HPI  See CC and PA student note:   Past Medical History  Diagnosis Date  . SLEEP APNEA, OBSTRUCTIVE     CPAP qhs  . VITAMIN B12 DEFICIENCY   . PARKINSON'S DISEASE   . CORONARY ARTERY DISEASE     s/p  CABG 2003-cath 12/31/09-occluded sap dx  graft, otherwise patent grafts  . Chronic systolic heart failure     LVEF 35-40% by cath 12/31/09 s/p BiV ICD 07/2010  . Gastroparesis   . CORONARY ARTERY BYPASS GRAFT, THREE VESSEL, HX OF 2003  . AUTOMATIC IMPLANTABLE CARDIAC DEFIBRILLATOR SITU 07/2010 BiV ICD  . ANEMIA, MACROCYTIC   . DEPRESSION   . DIABETES MELLITUS, TYPE I   . GERD   . HYPERCHOLESTEROLEMIA   . HYPERTENSION   . RENAL FAILURE, END STAGE     HD MWD a/w anemia of chronic dz, hypotension  . PERIPHERAL NEUROPATHY   . CVA (cerebral vascular accident) 07/2011    L HP and dysphagia, improved  . Seizures    Review of Systems  Constitutional: Positive for fatigue. Negative for fever and unexpected weight change.  Respiratory: Negative for shortness of breath.   Cardiovascular: Negative for chest pain and leg swelling.  Gastrointestinal: Negative for abdominal pain, diarrhea, constipation and blood in stool.  Neurological: Negative for dizziness and facial asymmetry.       Objective:   Physical Exam  BP 100/58  Pulse 102  Temp(Src) 97.9 F (36.6 C) (Oral)  Wt 193 lb 6.4 oz (87.726 kg)  BMI 25.52 kg/m2  SpO2 97% Wt Readings from Last 3 Encounters:  05/06/13 193 lb 6.4 oz (87.726 kg)  01/30/13 192 lb 12.8 oz (87.454 kg)  01/02/13 194 lb (87.998 kg)   Constitutional: sitting in WC, but walks with cane asst -he appears well-developed and well-nourished. No distress. Wife and dtr at side HENT: Neck: Normal range of motion. Neck supple. No JVD present. No thyromegaly present.  Cardiovascular: Normal rate, regular rhythm and normal heart sounds.  No murmur heard. No BLE edema. Pacer  pocket right anterior chest with very mild bruising, no swelling or hematoma  Pulmonary/Chest: Effort normal and breath sounds normal. No respiratory distress. no wheezes.  MSkel: no gross deformities, no effusions Neuro: AAOx2, CN2-12 symmetrically intact; MAE well - no detectable L HP or dysarthria Psyc: reserved affect, flat and withdrawn mood      Lab Results  Component Value Date   WBC 4.4 10/01/2012   HGB 11.1* 10/01/2012   HCT 31.3* 10/01/2012   PLT 148* 10/01/2012   CHOL 131 08/09/2011   TRIG 193* 08/09/2011   HDL 30* 08/09/2011   LDLDIRECT 68.2 07/26/2010   ALT 17 09/30/2012   AST 18 09/30/2012   NA 137 10/01/2012   K 4.2 10/01/2012   CL 95* 10/01/2012   CREATININE 8.83* 10/01/2012   BUN 47* 10/01/2012   CO2 25 10/01/2012   TSH 1.296 08/15/2010   INR 1.14 08/09/2011   HGBA1C 6.7* 01/02/2013     Assessment & Plan:   sialadenitis - no evidence for infectious or competent features on exam - education reassurance provided. Family will call if worse or unimproved  Contact rash - advised A&D or barrier cream - no evidence for bacterial or fungal infection

## 2013-05-06 NOTE — Patient Instructions (Addendum)
It was good to see you today. Keep sugar-free hard candy in your mouth at all times to help increase salivary gland production. Hydration freely as needed for next few days Call us if increasing size, pain, redness or fever develop, but no evidence for infection or need for antibiotics at this time try A&D ointment or barrier cream to contact rash as discussed - written prescription given to you for barrier cream today Call if symptoms worse or unimproved, keep other scheduled visits as planned  Sialadenitis Sialadenitis is an inflammation (soreness) of the salivary glands. The parotid is the main salivary gland. It lies behind the angle of the jaw below the ear. The saliva produced comes out of a tiny opening (duct) inside the cheek on either side. This is usually at the level of the upper back teeth. If it is swollen, the ear is pushed up and out. This helps tell this condition apart from a simple lymph gland infection (swollen glands) in the same area. Mumps has mostly disappeared since the start of immunization against mumps. Now the most common cause of parotitis is germ (bacterial) infection or inflammation of the lymphatics (the lymph channels). The other major salivary gland is located in the floor of the mouth. Smaller salivary glands are located in the mouth. This includes the:  Lips.  Lining of the mouth.  Pharynx.  Hard palate (front part of the roof of the mouth). The salivary glands do many things, including:  Lubrication.  Breaking down food.  Production of hormones and antibodies (to protect against germs which may cause illness).  Help with the sense of taste. ACUTE BACTERIAL SIALADENITIS This is a sudden inflammatory response to bacterial infection. This causes redness, pain, swelling and tenderness over the infected gland. In the past, it was common in dehydrated and debilitated patients often following an operation. It is now more commonly seen:  After  radiotherapy.  In patients with poor immune systems. Treatment is:  The correction of fluid balance (rehydration).  Medicine that kill germs (antibiotics).  Pain relief. CHRONIC RECURRENT SIALADENITIS This refers to repeated episodes of discomfort and swelling of one of the salivary glands. It often occurs after eating. Chronic sialadenitis is usually less painful. It is associated with recurrent enlargement of a salivary gland, often following meals, and typically with an absence of redness. The chronic form of the disease often is associated with conditions linked to decreased salivary flow, rather than dehydration (loss of body fluids). These conditions include:  A stone, or concretion, formed in the gallbladder, kidneys, or other parts of the body (calculi).  Salivary stasis.  A change in the fluid and electrolyte (the salts in your body fluids) makeup of the gland. It is treated with:  Gland massage.  Methods to stimulate the flow of saliva, (for example, lemon juice).  Antibiotics if required. Surgery to remove the gland is possible, but its benefits need to be balanced against risks.  VIRAL SIALADENITIS Several viruses infect the salivary glands. Some of these include the mumps virus that commonly infects the parotid gland. Other viruses causing problems are:  The HIV virus.  Herpes.  Some of the influenza ("flu") viruses. RECURRENT SIALADENITIS IN CHILDREN This condition is thought to be due to swelling or ballooning of the ducts. It results in the same symptoms as acute bacterial parotitis. It is usually caused by germs (bacteria). It is often treated using penicillin. It may get well without treatment. Surgery is usually not required. TUBERCULOUS SIALADENITIS The salivary  glands may become infected with the same bacteria causing tuberculosis ("TB"). Treatment is with anti-tuberculous antibiotic therapy. OTHER UNCOMMON CAUSES OF SIALADENITIS   Sjogren's syndrome is a  condition in which arthritis is associated with a decrease in activity of the glands of the body that produce saliva and tears. The diagnosis is made with blood tests or by examination of a piece of tissue from the inside of the lip. Some people with this condition are bothered by:  A dry mouth.  Intermittent salivary gland enlargement.  Atypical mycobacteria is a germ similar to tuberculosis. It often infects children. It is often resistant to antibiotic treatment. It may require surgical treatment to remove the infected salivary gland.  Actinomycosis is an infection of the parotid gland that may also involve the overlying skin. The diagnosis is made by detecting granules of sulphur produced by the bacteria on microscopic examination. Treatment is a prolonged course of penicillin for up to one year.  Nutritional causes include vitamin deficiencies and bulimia.  Diabetes and problems with your thyroid.  Obesity, cirrhosis, and malabsorption are some metabolic causes. HOME CARE INSTRUCTIONS   Apply ice bags every 2 hours for 15 to 20 minutes, while awake, to the sore gland for 24 hours, then as directed by your caregiver. Place the ice in a plastic bag with a towel around it to prevent frostbite to the skin.  Only take over-the-counter or prescription medicines for pain, discomfort, or fever as directed by your caregiver. SEEK IMMEDIATE MEDICAL CARE IF:   There is increased pain or swelling in your gland that is not controlled with medicine.  An oral temperature above 102 F (38.9 C) develops, not controlled by medicine.  You develop difficulty opening your mouth, swallowing, or speaking. Document Released: 06/02/2002 Document Revised: 03/04/2012 Document Reviewed: 07/27/2008 [facility] Patient Information 2013 [place], [place].

## 2013-05-15 ENCOUNTER — Encounter: Payer: Self-pay | Admitting: Endocrinology

## 2013-05-15 ENCOUNTER — Ambulatory Visit (INDEPENDENT_AMBULATORY_CARE_PROVIDER_SITE_OTHER): Payer: 59 | Admitting: Endocrinology

## 2013-05-15 VITALS — BP 130/72 | HR 100 | Ht 71.0 in | Wt 192.0 lb

## 2013-05-15 DIAGNOSIS — E1029 Type 1 diabetes mellitus with other diabetic kidney complication: Secondary | ICD-10-CM

## 2013-05-15 NOTE — Progress Notes (Signed)
Subjective:    Patient ID: [name], male    DOB: 06/12/1933, 79 y.o.   MRN: [id number]  HPI Pt returns for f/u of IDDM (dx'ed 1977; he has moderate sensory neuropathy of the lower extremities, and associated ESRD, retinopathy, gastroparesis, CVA, CAD, and peripheral sensory neuropathy).  no cbg record, but wife states cbg's vary from 123-300.  There is no trend throughout the day.  He says he does not feel well in general, but there is no change since last ov. Past Medical History  Diagnosis Date  . SLEEP APNEA, OBSTRUCTIVE     CPAP qhs  . VITAMIN B12 DEFICIENCY   . PARKINSON'S DISEASE   . CORONARY ARTERY DISEASE     s/p  CABG 2003-cath 12/31/09-occluded sap dx  graft, otherwise patent grafts  . Chronic systolic heart failure     LVEF 35-40% by cath 12/31/09 s/p BiV ICD 07/2010  . Gastroparesis   . CORONARY ARTERY BYPASS GRAFT, THREE VESSEL, HX OF 2003  . AUTOMATIC IMPLANTABLE CARDIAC DEFIBRILLATOR SITU 07/2010 BiV ICD  . ANEMIA, MACROCYTIC   . DEPRESSION   . DIABETES MELLITUS, TYPE I   . GERD   . HYPERCHOLESTEROLEMIA   . HYPERTENSION   . RENAL FAILURE, END STAGE     HD MWD a/w anemia of chronic dz, hypotension  . PERIPHERAL NEUROPATHY   . CVA (cerebral vascular accident) 07/2011    L HP and dysphagia, improved  . Seizures     Past Surgical History  Procedure Laterality Date  . Coronary artery bypass graft    . Cataract extraction, bilateral    . Pacemaker placement      permanent  . Dialysis fistula creation    . Shunt replacement    . Doppler echocardiography  2005, 2008, 2010, &2012    History   Social History  . Marital Status: Married    Spouse Name: N/A    Number of Children: 2  . Years of Education: N/A   Occupational History  . retired    Social History Main Topics  . Smoking status: Never Smoker   . Smokeless tobacco: Never Used     Comment: Married, lives with spouse, supportive dtr and son nearby. Pt is on dialysis  . Alcohol Use: No  . Drug Use:  No  . Sexually Active: Not on file   Other Topics Concern  . Not on file   Social History Narrative   Pt does not get regular exercise.   Pt is on dialysis-MWF_since 09/2009   Lives with spouse, supportive dtr and son nearby    Current Outpatient Prescriptions on File Prior to Visit  Medication Sig Dispense Refill  . acetaminophen (TYLENOL) 325 MG tablet Take 325 mg by mouth every 4 (four) hours as needed. For pain      . aspirin 81 MG tablet Take 81 mg by mouth daily.        Marland Kitchen atorvastatin (LIPITOR) 20 MG tablet Take 20 mg by mouth daily.        . calcium acetate (PHOSLO) 667 MG capsule 3 (three) times daily. 1 capsule at dinner      . cholecalciferol (VITAMIN D) 400 UNITS TABS Take 1,000 Units by mouth.      . citalopram (CELEXA) 20 MG tablet Take 1 tablet (20 mg total) by mouth daily.  90 tablet  3  . docusate sodium (COLACE) 100 MG capsule Take 100 mg by mouth 2 (two) times daily as needed. For constipation      .  Ginger, Zingiber officinalis, (GINGER ROOT PO) Take by mouth.      Marland Kitchen glucagon (GLUCAGON EMERGENCY) 1 MG injection Use as directed       . glucose blood (TRUETEST TEST) test strip Use as instructed to check blood sugar four times a day dx 250.41  200 each  3  . HUMALOG KWIKPEN 100 UNIT/ML injection INJECT FOUR TIMES DAILY: 15-10-30-15 UNITS  75 mL  0  . HUMULIN N PEN 100 UNIT/ML injection INJECT 45 UNITS SUBCUTANEOUSLY EVERY NIGHT AT BEDTIME AS DIRECTED  15 mL  4  . hydrOXYzine (ATARAX/VISTARIL) 25 MG tablet TAKE ONE TABLET BY MOUTH IN THE MORNING OF DIALYSIS AND EVERY 6 HOURS AS NEEDED FOR ITCHING SYMPTOMS  60 tablet  3  . Insulin Pen Needle (B-D ULTRAFINE III SHORT PEN) 31G X 8 MM MISC Use as directed five times daily  600 each  3  . isosorbide mononitrate (IMDUR) 60 MG 24 hr tablet Take 60 mg by mouth daily. On non dialysis days      . Lancet Devices (RA LANCING DEVICE) MISC 1 Device by Does not apply route once.  1 each  0  . Lancets 28G MISC Use as directed to check  blood sugar four times daily dx 250.41  200 each  3  . levETIRAcetam (KEPPRA) 750 MG tablet Take 750 mg by mouth 2 (two) times daily.      . metoprolol succinate (TOPROL-XL) 50 MG 24 hr tablet Take one and one-half tabs by mouth once daily  45 tablet  11  . multivitamin (RENA-VIT) TABS tablet Take 1 tablet by mouth daily.  30 tablet  11  . NEXIUM 40 MG capsule TAKE ONE CAPSULE BY MOUTH TWICE DAILY  90 capsule  5  . nitroGLYCERIN (NITROSTAT) 0.4 MG SL tablet Place 0.4 mg under the tongue every 5 (five) minutes as needed. For chest pain      . NON FORMULARY CPAP  For obstructive sleep apnea       . NON FORMULARY Dialysis  On Mon Wed Fri epogen 3800units,  infed 50mg ,  zemplar 2 mcg      . Nutritional Supplements (NEPRO) LIQD Take 1 Can by mouth at bedtime.       . ondansetron (ZOFRAN) 4 MG tablet Take 1 tablet (4 mg total) by mouth every 12 (twelve) hours as needed for nausea.  60 tablet  5  . oxycodone (OXY-IR) 5 MG capsule Take 5 mg by mouth every 4 (four) hours as needed. For pain      . prasugrel (EFFIENT) 10 MG TABS Take 1 tablet (10 mg total) by mouth daily.  30 tablet  8  . promethazine (PHENERGAN) 12.5 MG tablet Take 12.5 mg by mouth every 4 (four) hours as needed. For nausea      . promethazine (PROMETHEGAN) 12.5 MG suppository Place 1 suppository (12.5 mg total) rectally every 6 (six) hours as needed for nausea.  12 each  0  . ranolazine (RANEXA) 500 MG 12 hr tablet       . rivastigmine (EXELON) 9.5 mg/24hr Place 1 patch (9.5 mg total) onto the skin daily.  90 patch  3  . traMADol (ULTRAM) 50 MG tablet Take 50 mg by mouth 2 (two) times daily.       Marland Kitchen zolpidem (AMBIEN) 5 MG tablet Take 5 mg by mouth at bedtime as needed. For insomnia       No current facility-administered medications on file prior to visit.    Allergies  Allergen Reactions  . Nifedipine     Unknown  . Tape     Unknown    Family History  Problem Relation Age of Onset  . Stroke Mother   . Diabetes Father    . Lung cancer Father   . Diabetes Brother   . Stomach cancer Paternal Aunt   . Colon cancer Neg Hx     BP 130/72  Pulse 100  Ht 5\' 11"  (1.803 m)  Wt 192 lb (87.091 kg)  BMI 26.79 kg/m2  SpO2 98%  Review of Systems denies hypoglycemia and weight change    Objective:   Physical Exam VITAL SIGNS:  See vs page GENERAL: no distress     Assessment & Plan:  DM: uncertain control.  This insulin regimen was chosen from multiple options, as it best matches his insulin to his changing requirements throughout the day.   Toe abrasion, new

## 2013-05-15 NOTE — Patient Instructions (Addendum)
Please continue humalog, (just before each meal) 10-03-34-15 units (the 15 units is if you eat a bedtime snack).  take extra novolog 5 units as needed for any sugar over 300.  Please schedule a follow-up appointment in 4 months.  continue nph, 45 units units at bedtime only.  check your blood sugar 4 times a day--before the 3 meals, and at bedtime.  also check if you have symptoms of your blood sugar being too high or too low.  please keep a record of the readings and bring it to your next appointment here.  please call us sooner if you are having low blood sugar episode.    Keep the scrape on the right 2nd to covered with neosporin and a bandaid.  Please call if it looks worse. blood tests are being requested for you today.  We'll contact you with results.

## 2013-05-20 ENCOUNTER — Encounter: Payer: Self-pay | Admitting: Internal Medicine

## 2013-05-20 ENCOUNTER — Ambulatory Visit (INDEPENDENT_AMBULATORY_CARE_PROVIDER_SITE_OTHER): Payer: 59 | Admitting: *Deleted

## 2013-05-20 DIAGNOSIS — I5022 Chronic systolic (congestive) heart failure: Secondary | ICD-10-CM

## 2013-05-20 DIAGNOSIS — I428 Other cardiomyopathies: Secondary | ICD-10-CM

## 2013-05-20 DIAGNOSIS — Z95810 Presence of automatic (implantable) cardiac defibrillator: Secondary | ICD-10-CM

## 2013-05-21 ENCOUNTER — Other Ambulatory Visit: Payer: Self-pay

## 2013-05-21 MED ORDER — INSULIN LISPRO 100 UNIT/ML (KWIKPEN): PEN_INJECTOR | SUBCUTANEOUS | Status: DC

## 2013-05-26 LAB — REMOTE ICD DEVICE
AL IMPEDENCE ICD: 494 Ohm
ATRIAL PACING ICD: 0.03 pct
BAMS-0001: 170 {beats}/min
CHARGE TIME: 10.64 s
FVT: 0
LV LEAD THRESHOLD: 0.875 V
PACEART VT: 0
TOT-0001: 1
TOT-0002: 0
TZAT-0001ATACH: 1
TZAT-0001ATACH: 2
TZAT-0001ATACH: 3
TZAT-0001FASTVT: 1
TZAT-0002ATACH: NEGATIVE
TZAT-0002ATACH: NEGATIVE
TZAT-0004SLOWVT: 8
TZAT-0004SLOWVT: 8
TZAT-0011SLOWVT: 10 ms
TZAT-0011SLOWVT: 10 ms
TZAT-0012ATACH: 150 ms
TZAT-0012FASTVT: 200 ms
TZAT-0012SLOWVT: 200 ms
TZAT-0012SLOWVT: 200 ms
TZAT-0013SLOWVT: 2
TZAT-0018ATACH: NEGATIVE
TZAT-0018ATACH: NEGATIVE
TZAT-0018ATACH: NEGATIVE
TZAT-0019ATACH: 6 V
TZAT-0019ATACH: 6 V
TZAT-0019ATACH: 6 V
TZAT-0019SLOWVT: 8 V
TZAT-0020FASTVT: 1.5 ms
TZON-0003SLOWVT: 340 ms
TZON-0003VSLOWVT: 400 ms
TZON-0004SLOWVT: 16
TZON-0004VSLOWVT: 20
TZST-0001ATACH: 5
TZST-0001ATACH: 6
TZST-0001FASTVT: 2
TZST-0001SLOWVT: 5
TZST-0002ATACH: NEGATIVE
TZST-0002ATACH: NEGATIVE
TZST-0002FASTVT: NEGATIVE
TZST-0002FASTVT: NEGATIVE
TZST-0002FASTVT: NEGATIVE
TZST-0003SLOWVT: 20 J
TZST-0003SLOWVT: 25 J
TZST-0003SLOWVT: 35 J
VENTRICULAR PACING ICD: 99.9 pct

## 2013-05-27 ENCOUNTER — Ambulatory Visit: Payer: Self-pay | Admitting: Otolaryngology

## 2013-05-29 ENCOUNTER — Telehealth: Payer: Self-pay | Admitting: *Deleted

## 2013-05-29 NOTE — Telephone Encounter (Signed)
Called daughter back (Debbie) LMOM md response...Raechel Chute

## 2013-05-29 NOTE — Telephone Encounter (Signed)
Left msg on vm stating wanting to inform md went to see [facility] concerning dad mass on his jaw. They did do a tissue sample and it did come back with some cancer cell in it. He has arrange dad to have a cat scan & appt to see oncologist for next week...Raechel Chute

## 2013-05-29 NOTE — Telephone Encounter (Signed)
Thanks for the message and update. We will look for the consult reports and scan into EMR when they come Please let us know if there is anything we can do to help - thanks

## 2013-06-03 ENCOUNTER — Ambulatory Visit: Payer: Self-pay | Admitting: Otolaryngology

## 2013-06-05 ENCOUNTER — Ambulatory Visit: Payer: Self-pay | Admitting: Oncology

## 2013-06-07 ENCOUNTER — Encounter: Payer: Self-pay | Admitting: Internal Medicine

## 2013-06-12 LAB — CREATININE, SERUM
Creatinine: 6.43 mg/dL — ABNORMAL HIGH (ref 0.60–1.30)
EGFR (African American): 9 — ABNORMAL LOW
EGFR (Non-African Amer.): 8 — ABNORMAL LOW

## 2013-06-18 ENCOUNTER — Other Ambulatory Visit: Payer: Self-pay

## 2013-06-18 MED ORDER — INSULIN LISPRO 100 UNIT/ML (KWIKPEN): PEN_INJECTOR | SUBCUTANEOUS | Status: AC

## 2013-06-19 ENCOUNTER — Ambulatory Visit: Payer: Self-pay | Admitting: Vascular Surgery

## 2013-06-24 ENCOUNTER — Ambulatory Visit: Payer: Self-pay | Admitting: Oncology

## 2013-06-24 LAB — COMPREHENSIVE METABOLIC PANEL
Albumin: 3.4 g/dL (ref 3.4–5.0)
Alkaline Phosphatase: 79 U/L (ref 50–136)
Anion Gap: 10 (ref 7–16)
Bilirubin,Total: 0.7 mg/dL (ref 0.2–1.0)
Chloride: 98 mmol/L (ref 98–107)
Creatinine: 6.8 mg/dL — ABNORMAL HIGH (ref 0.60–1.30)
EGFR (African American): 8 — ABNORMAL LOW
EGFR (Non-African Amer.): 7 — ABNORMAL LOW
Glucose: 191 mg/dL — ABNORMAL HIGH (ref 65–99)
Osmolality: 283 (ref 275–301)
SGOT(AST): 115 U/L — ABNORMAL HIGH (ref 15–37)
SGPT (ALT): 94 U/L — ABNORMAL HIGH (ref 12–78)
Total Protein: 7.7 g/dL (ref 6.4–8.2)

## 2013-06-24 LAB — CBC CANCER CENTER
Eosinophil #: 0.1 x10 3/mm (ref 0.0–0.7)
HGB: 10.8 g/dL — ABNORMAL LOW (ref 13.0–18.0)
MCH: 32.4 pg (ref 26.0–34.0)
MCHC: 34.6 g/dL (ref 32.0–36.0)
MCV: 94 fL (ref 80–100)
Monocyte #: 0.6 x10 3/mm (ref 0.2–1.0)
Neutrophil #: 4.4 x10 3/mm (ref 1.4–6.5)
Neutrophil %: 74.3 %
Platelet: 188 x10 3/mm (ref 150–440)
RBC: 3.34 10*6/uL — ABNORMAL LOW (ref 4.40–5.90)
RDW: 14.8 % — ABNORMAL HIGH (ref 11.5–14.5)
WBC: 6 x10 3/mm (ref 3.8–10.6)

## 2013-06-25 ENCOUNTER — Other Ambulatory Visit: Payer: Self-pay | Admitting: Neurology

## 2013-06-25 NOTE — Telephone Encounter (Signed)
Pt's daughter aware and appt made for August 14th and rx sent in.

## 2013-06-25 NOTE — Telephone Encounter (Signed)
One month refill with 1 extra.  Overdue for follow up appointment and needs appt before those run out or cannot refill

## 2013-06-25 NOTE — Telephone Encounter (Signed)
Pt's daughter calling, pt recently diagnosed with cancer and is starting chemotherapy.  It will be difficult to get him to [facility] for a f/u for the next couple of months and want to know if he can have refills of his keppra and exelon until he can get back. Other than the cancer she said he is doing well.  No problems on the exelon or keppra.

## 2013-06-26 ENCOUNTER — Encounter: Payer: Self-pay | Admitting: Internal Medicine

## 2013-06-26 ENCOUNTER — Telehealth: Payer: Self-pay | Admitting: *Deleted

## 2013-06-26 NOTE — Telephone Encounter (Signed)
Pt's daughter, Donivan Scull called and lvm stating that the pt has been diagnosed with Stage 4 small cell carcinoma and has begun chemo this week (Tues). Pt's bg levels have been high, they feel it is probably due to the chemo but wanted Dr Everardo All to be aware. Morning bg has been as high as 470 and low as 226. She would like to know if there needs to be any changes in dosage of insulin? She states any advice would be helpful. Return [phone]. Please advise in Dr George Hugh absence. Thank you.

## 2013-06-26 NOTE — Telephone Encounter (Signed)
Called but could not leave a message >> sent a msg through MyChart.

## 2013-07-01 ENCOUNTER — Other Ambulatory Visit: Payer: Self-pay

## 2013-07-01 ENCOUNTER — Other Ambulatory Visit: Payer: Self-pay | Admitting: Neurology

## 2013-07-01 LAB — CBC CANCER CENTER
Basophil #: 0.1 x10 3/mm (ref 0.0–0.1)
Eosinophil #: 0 x10 3/mm (ref 0.0–0.7)
Eosinophil %: 0.5 %
HCT: 25.1 % — ABNORMAL LOW (ref 40.0–52.0)
HGB: 8.7 g/dL — ABNORMAL LOW (ref 13.0–18.0)
Lymphocyte #: 0.5 x10 3/mm — ABNORMAL LOW (ref 1.0–3.6)
MCV: 95 fL (ref 80–100)
Monocyte #: 0.1 x10 3/mm — ABNORMAL LOW (ref 0.2–1.0)
Monocyte %: 1.7 %
Neutrophil #: 5.4 x10 3/mm (ref 1.4–6.5)
Neutrophil %: 87.9 %
RBC: 2.65 10*6/uL — ABNORMAL LOW (ref 4.40–5.90)
WBC: 6.2 x10 3/mm (ref 3.8–10.6)

## 2013-07-01 LAB — COMPREHENSIVE METABOLIC PANEL
Albumin: 3.2 g/dL — ABNORMAL LOW (ref 3.4–5.0)
Alkaline Phosphatase: 61 U/L (ref 50–136)
Anion Gap: 12 (ref 7–16)
Bilirubin,Total: 0.6 mg/dL (ref 0.2–1.0)
Calcium, Total: 8.7 mg/dL (ref 8.5–10.1)
Co2: 27 mmol/L (ref 21–32)
Creatinine: 6.84 mg/dL — ABNORMAL HIGH (ref 0.60–1.30)
EGFR (African American): 8 — ABNORMAL LOW
Glucose: 251 mg/dL — ABNORMAL HIGH (ref 65–99)
Osmolality: 299 (ref 275–301)
SGOT(AST): 46 U/L — ABNORMAL HIGH (ref 15–37)
SGPT (ALT): 78 U/L (ref 12–78)
Sodium: 138 mmol/L (ref 136–145)

## 2013-07-01 MED ORDER — LEVETIRACETAM 500 MG PO TABS: ORAL_TABLET | ORAL | Status: DC

## 2013-07-01 MED ORDER — RIVASTIGMINE 9.5 MG/24HR TD PT24: 1.0000 | MEDICATED_PATCH | Freq: Every day | TRANSDERMAL | Status: AC

## 2013-07-01 NOTE — Telephone Encounter (Signed)
Per pt's daughter he was recently diagnosed with cancer and it will be difficult for him to make it to [facility] for a f/u at the moment, but is doing well on his meds and would like refill of Keppra and exelon, ok per Tat.  Pt will f/u in August.

## 2013-07-04 ENCOUNTER — Encounter: Payer: Self-pay | Admitting: *Deleted

## 2013-07-04 ENCOUNTER — Other Ambulatory Visit: Payer: Self-pay

## 2013-07-04 MED ORDER — LEVETIRACETAM 750 MG PO TABS: 750.0000 mg | ORAL_TABLET | Freq: Two times a day (BID) | ORAL | Status: AC

## 2013-07-04 NOTE — Telephone Encounter (Signed)
Keppra 750 sent in for pt.  He is to follow up in Aug.

## 2013-07-05 ENCOUNTER — Other Ambulatory Visit: Payer: Self-pay | Admitting: Endocrinology

## 2013-07-07 ENCOUNTER — Other Ambulatory Visit: Payer: Self-pay | Admitting: *Deleted

## 2013-07-07 MED ORDER — INSULIN ISOPHANE HUMAN 100 UNIT/ML KWIKPEN: 45.0000 [IU] | PEN_INJECTOR | Freq: Every day | SUBCUTANEOUS | Status: AC

## 2013-07-25 ENCOUNTER — Ambulatory Visit: Payer: Self-pay | Admitting: Oncology

## 2013-07-31 ENCOUNTER — Ambulatory Visit: Payer: 59 | Admitting: Internal Medicine

## 2013-08-07 ENCOUNTER — Ambulatory Visit: Payer: 59 | Admitting: Internal Medicine

## 2013-08-07 ENCOUNTER — Ambulatory Visit: Payer: 59 | Admitting: Neurology

## 2013-08-20 ENCOUNTER — Telehealth: Payer: Self-pay | Admitting: Internal Medicine

## 2013-08-20 NOTE — Telephone Encounter (Signed)
New Problem   On 9/2 pt has a remot defib appt// however Pt is in hospice// stage 4, small cell cancer and is not taking any treatment for the cancer.. May not make the appt.// Family wanted to call and let us know.

## 2013-08-20 NOTE — Telephone Encounter (Signed)
Noted in PaceArt/kwm

## 2013-08-26 ENCOUNTER — Encounter: Payer: 59 | Admitting: *Deleted

## 2013-09-01 ENCOUNTER — Telehealth: Payer: Self-pay | Admitting: Internal Medicine

## 2013-09-01 NOTE — Telephone Encounter (Signed)
Spoke w/daughter in regards to father being in Hospice care. Family would like ICD to be turned off. Will speak with GT on 09-02-13 in regards to this matter. [name] will turn off device. Best phone # is [phone].

## 2013-09-01 NOTE — Telephone Encounter (Signed)
LMOM/kwm  

## 2013-09-02 NOTE — Telephone Encounter (Signed)
Spoke w/GT in regards to ICD being turned off. Telephone note routed to GT.  Also spoke with pt's wife, [name] to let know [name] will be in touch on 09-04-13 to make arrangements to come by and turn off device. Pt lives in [place].

## 2013-09-04 ENCOUNTER — Ambulatory Visit (INDEPENDENT_AMBULATORY_CARE_PROVIDER_SITE_OTHER): Payer: Medicare Other | Admitting: *Deleted

## 2013-09-04 DIAGNOSIS — I5022 Chronic systolic (congestive) heart failure: Secondary | ICD-10-CM

## 2013-09-05 LAB — ICD DEVICE OBSERVATION
AL IMPEDENCE ICD: 456 Ohm
AL THRESHOLD: 1.75 V
BATTERY VOLTAGE: 2.9983 V
FVT: 0
PACEART VT: 0
RV LEAD THRESHOLD: 0.75 V
TOT-0006: 20110823000000
TZAT-0001SLOWVT: 1
TZAT-0002ATACH: NEGATIVE
TZAT-0002ATACH: NEGATIVE
TZAT-0002FASTVT: NEGATIVE
TZAT-0002SLOWVT: NEGATIVE
TZAT-0012ATACH: 150 ms
TZAT-0012SLOWVT: 200 ms
TZAT-0018ATACH: NEGATIVE
TZAT-0018FASTVT: NEGATIVE
TZAT-0018SLOWVT: NEGATIVE
TZAT-0018SLOWVT: NEGATIVE
TZAT-0019ATACH: 6 V
TZAT-0019FASTVT: 8 V
TZAT-0019SLOWVT: 8 V
TZAT-0020ATACH: 1.5 ms
TZAT-0020ATACH: 1.5 ms
TZAT-0020ATACH: 1.5 ms
TZON-0003VSLOWVT: 400 ms
TZST-0001FASTVT: 5
TZST-0001FASTVT: 6
TZST-0001SLOWVT: 3
TZST-0002ATACH: NEGATIVE
TZST-0002ATACH: NEGATIVE
TZST-0002FASTVT: NEGATIVE
TZST-0002FASTVT: NEGATIVE
TZST-0002FASTVT: NEGATIVE
TZST-0002SLOWVT: NEGATIVE
VENTRICULAR PACING ICD: 99.39 pct
VF: 0

## 2013-09-05 NOTE — Progress Notes (Signed)
ICD therapies turned off.  The patient is in the care of Hospice.

## 2013-09-11 NOTE — Telephone Encounter (Signed)
I agree with turning off the device.

## 2013-09-18 ENCOUNTER — Ambulatory Visit: Payer: 59 | Admitting: Endocrinology

## 2013-09-24 ENCOUNTER — Encounter: Payer: Self-pay | Admitting: Internal Medicine

## 2013-09-24 DEATH — deceased

## 2013-10-21 ENCOUNTER — Telehealth: Payer: Self-pay | Admitting: Internal Medicine

## 2013-10-21 NOTE — Telephone Encounter (Signed)
Spoke With Willette Pa Wife Of Pt Received Letter Via Mail from her asking For Copies of Pt's Medical  Records,and D/C I let Her Know  I cannot Send The Pt's Medical records To her unless He had a Letter of Testamentary. And I Do Not Have a Copy Of The D/C she would have to Obtain that From the  Doctor Who signed, Mrs.Shannahan agreed With Everything 10/21/13/KM

## 2015-04-16 NOTE — Op Note (Signed)
PATIENT NAME:  Jones, Dakota MR#:  [id number] DATE OF BIRTH:  05-22-1933  DATE OF PROCEDURE:  06/19/2013  PREOPERATIVE DIAGNOSES:  1. Lung cancer.  2. End-stage renal disease.  3. Poor venous access.  4. Arrhythmias requiring pacemaker previously.   POSTOPERATIVE DIAGNOSES:  1. Lung cancer.  2. End-stage renal disease.  3. Poor venous access.  4. Arrhythmias requiring pacemaker previously.   PROCEDURES:  1. Ultrasound guidance for vascular access, left jugular vein.  2. Fluoroscopic guidance for placement of catheter.  3. Central venogram for placement of catheter.  4. Placement of a CT compatible Infuse-a-Port, left jugular vein.   SURGEON: Algernon Huxley, M.D.   ASSISTANT: Melvyn Neth, PA-C.   ANESTHESIA: Local with moderate conscious sedation.   ESTIMATED BLOOD LOSS: Approximately 50 mL.   CONTRAST USED: Visipaque 5 mL.   FLUOROSCOPY TIME: Approximately 5 minutes.   INDICATION FOR PROCEDURE: A 81 year old white male with end-stage renal disease and multiple medical issues. He has recent diagnosis of lung cancer and needs a Port-A-Cath. Risks and benefits were discussed. Informed consent was obtained.   DESCRIPTION OF PROCEDURE: The patient is brought to the vascular and interventional radiology suite. The left neck and chest were sterilely prepped and draped, and a sterile surgical field was created. The left jugular vein was visualized. There appeared to be some old chronic thrombus in portions of the jugular vein, but I was able to access this in the jugular vein just above the clavicle under ultrasound guidance with a micropuncture needle. A micropuncture wire and sheath were then placed. A J-wire did not originally want to track down into the superior vena cava, and he had a right-sided what appeared to be dual lead pacemaker as well. Venogram was performed through a micropuncture sheath. There was a very steep angle and some irregularity around the pacer wires, but this did  not appear highly stenotic. I was then able to pass a wire. Initially, the catheter would not track without a wire, and we ultimately had to pass the catheter over the Magic Torque wire from a subclavicular counterincision and removed the wire and peel-away sheath. The inferior pocket was created with electrocautery and blunt dissection. The catheter was connected to the port, secured to the chest wall with 2 Prolene sutures. The wound was then irrigated with antibiotic-impregnated saline. The port withdrew blood and flushed easily with heparinized saline. The wound was then closed with a running 3-0 Vicryl and a 4-0 Monocryl and a single 4-0 Monocryl was placed at the access site. Dermabond was placed as a dressing. The patient tolerated the procedure well and was taken to the recovery room in stable condition.    ____________________________ Algernon Huxley, MD jsd:gb D: 06/19/2013 15:57:46 ET T: 06/19/2013 22:29:29 ET JOB#: [id number]  cc: Algernon Huxley, MD, <Dictator> Algernon Huxley MD ELECTRONICALLY SIGNED 06/30/2013 13:18

## 2015-04-18 NOTE — Op Note (Signed)
PATIENT NAME:  [name], [name] MR#:  [id number] DATE OF BIRTH:  05-25-1933  DATE OF PROCEDURE:  03/14/2012  PREOPERATIVE DIAGNOSES:  1. End-stage renal disease.  2. Poorly functioning left radiocephalic AV fistula.  3. Hypertension.  4. Diabetes.   POSTOPERATIVE DIAGNOSES: 1. End-stage renal disease.  2. Poorly functioning left radiocephalic AV fistula.  3. Hypertension.  4. Diabetes.   PROCEDURES:  1. Ultrasound guidance for vascular access to left radiocephalic AV fistula.  2. Left upper extremity fistulogram.  3. Percutaneous transluminal angioplasty of venous access site with 7 mm diameter angioplasty balloon.  4. Percutaneous transluminal angioplasty of paraanastomotic stenosis with 5 mm diameter angioplasty balloon.   SURGEON: Algernon Huxley, MD   ANESTHESIA: Local with moderate conscious sedation.   ESTIMATED BLOOD LOSS: Minimal.   INDICATION FOR PROCEDURE: The patient is a 81 year old white male with end-stage renal disease. He has a left radiocephalic AV fistula. He has had previous interventions and is now having poor flow on dialysis. We are asked to evaluate him. Fistulogram was performed to evaluate the fistula and potentially treat any problems. Risks and benefits were discussed. Informed consent was obtained.   DESCRIPTION OF PROCEDURE: The patient was brought to the Vascular Interventional Radiology Suite. Left upper extremity was sterilely prepped and draped and a sterile surgical field was created. The ultrasound machine was used to visualize the patent portion of the fistula in the midforearm. This was accessed under direct ultrasound guidance initially in an antegrade direction with a micropuncture wire and micropuncture sheath being placed. Imaging performed through the micropuncture sheath showed an approximately 70% stenosis at the venous access site. There was then a dual outflow in the upper arm from both the cephalic system as well as the deep venous system. I  upsized to a 6 [place] sheath and gave 2500 units of intravenous heparin for systemic anticoagulation. The Magic torque wire was used to cross the venous access stenosis without difficulty and a 7 mm diameter angioplasty balloon was inflated in this location. While this was inflated, injection was performed to evaluate the flow at the arterial side. This showed an approximately 70 to 80% stenosis in the swing portion just beyond the anastomosis and completion angiogram following angioplasty of venous access site showed this now to be widely patent with only mild residual stenosis of 10 to 20%. I was then able to flip the sheath with the help of a Magic torque wire and turn it in a retrograde direction across the paraanastomotic stenosis without difficulty with a Magic torque wire and then treated this lesion with a 5 mm diameter angioplasty balloon near the anastomosis. A waste was taken which resolved. Completion angiogram showed this area now to be patent with minimal residual stenosis. At this point, I elected to terminate the procedure. A 4-0 Monocryl purse-string suture was placed around the sheath. The sheath was removed and pressure was held. Sterile dressing was placed. The patient tolerated the procedure well and was taken to the recovery room in stable condition.    ____________________________ Algernon Huxley, MD jsd:drc D: 03/14/2012 10:36:56 ET T: 03/14/2012 11:10:03 ET JOB#: [id number]  cc: Algernon Huxley, MD, <Dictator> Algernon Huxley MD ELECTRONICALLY SIGNED 03/14/2012 15:44

## 2015-04-18 NOTE — Op Note (Signed)
PATIENT NAME:  SELAH, ZELMAN MR#:  [id number] DATE OF BIRTH:  01-Feb-1933  DATE OF PROCEDURE:  06/11/2012  PREOPERATIVE DIAGNOSIS: Complication of left arm AV fistula with difficulty in cannulation and poor flows.   POSTOPERATIVE DIAGNOSIS: Complication of left arm AV fistula with difficulty in cannulation and poor flows.   PROCEDURES PERFORMED:  1. Contrast injection of left arm AV fistula.  2. Percutaneous transluminal angioplasty to 7 mm, arterial anastomosis.  3. Percutaneous transluminal angioplasty to 7 mm, venous outflow proximal to the venous cannulation site.   SURGEON: Hortencia Pilar, M.D.   SEDATION: Versed 2 mg plus fentanyl 50 mcg administered IV. Continuous ECG, pulse oximetry and cardiopulmonary monitoring was performed throughout the entire procedure by the interventional radiology nurse. Total sedation time is 45 minutes.   ACCESS: 6 French sheath, retrograde direction, left arm AV fistula.   CONTRAST USED: Isovue 25 mL.   FLUORO TIME: 3.1 minutes.   INDICATIONS: Mr. Lloyd is a 81 year old gentleman who presents to the office with worsening difficulties with dialysis. They are having increasing problems with cannulation as well as worsening flow rates and decrease in KT/v. The risks and benefits for angiography and intervention were reviewed, all questions answered, and the patient agrees to proceed.   DESCRIPTION OF PROCEDURE: The patient is taken to special procedures and placed in the supine position with his left arm extended palm upward. The left arm is prepped and draped in sterile fashion. 1% lidocaine is infiltrated into the soft tissues and access to the fistula is obtained near the antecubital fossa in a retrograde direction with a micropuncture needle, microwire followed microsheath, and J-wire followed by 6 French sheath. KMP catheter is then used to negotiate the J-wire into the radial artery and from this location contrast injection is performed demonstrating a  string sign at the level of the arterial anastomosis and a greater than 75% stenosis just proximal to the venous cannulation site.   3000 units of heparin is given and a 6 x 4 balloon is advanced across the arterial lesion. It is inflated to 16 atmospheres for one minute. Follow-up angiography demonstrates little if any change and therefore a 7 x 2 Conquest balloon is advanced over the arterial lesion and inflated to 20 atmospheres for one minute. This balloon is then repositioned at the lesion proximal to the venous cannulation and an inflation to 16 atmospheres for one minute is performed. Follow-up angiography demonstrates resolution of the arterial stricture with significant improvement and less than 10% residual stenosis of the venous area stenosis.   Wire is pulled and sheath is then removed after 4-0 Monocryl pursestring suture is placed. There are no immediate complications.   INTERPRETATION: Initial views of the fistula demonstrate the radial artery is patent as is the palmar arch just above the anastomosis. There is a string sign extending over approximately 2 to 2.5 cm. There is then a mildly dilated area where the arterial venous cannulations are  being performed and proximal to this there is a 75 to 80% stenosis. Antecubital bridging vein, basilic and cephalic veins in the upper arm are all widely patent.   Following angioplasty as described above, there is resolution of these strictures.   SUMMARY: Successful salvage of left arm radiocephalic fistula.  ____________________________ Katha Cabal, MD ggs:slb D: 06/11/2012 14:18:29 ET T: 06/11/2012 14:35:20 ET JOB#: [id number]  cc: Katha Cabal, MD, <Dictator> Donato Heinz, MD Katha Cabal MD ELECTRONICALLY SIGNED 06/20/2012 10:03

## 2015-04-18 NOTE — Op Note (Signed)
PATIENT NAME:  [name], [name] MR#:  [id number] DATE OF BIRTH:  June 14, 1933  DATE OF PROCEDURE:  03/29/2012  PREOPERATIVE DIAGNOSES:  1. Complication AV dialysis device with hematoma formation and difficulty with cannulation.   2. End-stage renal disease requiring hemodialysis.   POSTOPERATIVE DIAGNOSIS:  1. Complication AV dialysis device with hematoma formation and difficulty with cannulation.   2. End-stage renal disease requiring hemodialysis.   PROCEDURE PERFORMED: Contrast injection left radiocephalic fistula.   SURGEON: Katha Cabal, M.D.   SEDATION: Versed 3 mg plus fentanyl 100 mcg administered IV. Continuous ECG, pulse oximetry and cardiopulmonary monitoring is performed throughout the entire procedure by the interventional radiology nurse. Total sedation time was 30 minutes.   ACCESS: 6 French sheath, antegrade direction, left wrist fistula.  CONTRAST USED: Isovue 28 mL.  FLUORO TIME: Approximately two minutes.   INDICATIONS: [name] is a 81 year old gentleman who presented from his dialysis center after difficulty with cannulation and subsequent hematoma formation earlier today. He is, therefore, undergoing angiography with the hope for intervention should a problem be found. The risks and benefits are reviewed. All questions are answered. The patient has agreed to proceed.   DESCRIPTION OF PROCEDURE: The patient is taken to special procedures and placed in the supine position. After adequate sedation is achieved, his left arm is extended palm upward and prepped and draped in sterile fashion. 1% lidocaine is infiltrated in the soft tissues and access to the fistula itself is obtained with a micropuncture needle in an antegrade direction near the arterial anastomosis. Microwire followed by micro sheath, J-wire followed by a 6 French sheath is inserted. Hand injection of contrast is then utilized to demonstrate the fistula as well as the central venous anatomy. With direct manual  compression in the mid forearm, reflux images of the arterial anastomosis are obtained. After review of the images, pursestring suture of 4-0 Monocryl is placed. Sheath is pulled and there are no immediate complications.   INTERPRETATION: Fistula appears widely patent. There is mild external compression in the more proximal part of the forearm consistent with the location of the hematoma; however, there is no hemodynamically significant stenosis identified. The veins in the upper arm are well developed and widely patent. Central veins are widely patent. Reflux image demonstrates the arterial anastomosis is widely patent. The previously angioplastied area of the vein has remained widely patent.   SUMMARY: Adequate radiocephalic fistula, left arm.   ____________________________ Katha Cabal, MD ggs:ap D: 03/29/2012 16:30:59 ET T: 03/30/2012 10:04:34 ET JOB#: [id number]  cc: Katha Cabal, MD, <Dictator> Katha Cabal MD ELECTRONICALLY SIGNED 04/03/2012 14:08

## 2015-04-18 NOTE — Op Note (Signed)
PATIENT NAME:  [name], [name] MR#:  [id number] DATE OF BIRTH:  04/22/1933  DATE OF PROCEDURE:  12/21/2011  PREOPERATIVE DIAGNOSES:  1. Poorly functioning dialysis access with difficulty in cannulation.  2. Endstage renal disease requiring hemodialysis.   POSTOPERATIVE DIAGNOSES:  1. Poorly functioning dialysis access with difficulty in cannulation.  2. Endstage renal disease requiring hemodialysis.   PROCEDURES PERFORMED:  1. Contrast injection left arm, radiocephalic fistula.  2. Percutaneous transluminal angioplasty to a maximal diameter of 7 mm, venous portion left arm AV fistula.   SURGEON: Hortencia Pilar, M.D.   SEDATION: Versed 4 mg plus fentanyl 150 mcg administered IV. Continuous ECG, pulse oximetry, and cardiopulmonary monitoring was performed throughout the entire procedure by the interventional radiology nurse. Total sedation time was 1 hour, 20 minutes.   ACCESS: 6 French sheath, antegrade direction, left arm radiocephalic fistula.   CONTRAST USED: Isovue 30 mL.   FLUOROSCOPY TIME: 2.1 minutes.   INDICATIONS: [name] is a 81 year old gentleman who presented with difficulty in cannulation. They are having increasing problems with his flow rates and he is, therefore, undergoing contrast injection. Risks and benefits were reviewed. All questions are answered. The patient agrees to proceed.   DESCRIPTION OF PROCEDURE: The patient is taken to the Special Procedures suite, placed in the supine position. After adequate sedation is achieved, his left arm is extended palm upward and prepped and draped in a sterile fashion. 1% lidocaine is infiltrated into the soft tissues overlying the fistula near the arterial anastomosis and a micropuncture needle is inserted into the fistula without difficulty in an antegrade direction. Microwire followed by micro sheath is inserted. Hand injection of contrast is then utilized and demonstrates a stricture at the level of the first buttonhole.   J-wire is then advanced followed by placement of a 6 French sheath. More proximal venography is performed including central venography. 3000 units of heparin is given. Magic torque wire is then passed through the fistula and serial dilatation is performed of the stricture beginning with a 5-mm x 4-cm balloon and then increasing to a 6-mm and finally a 7-mm diameter balloon. Inflations are to 10 to 12 atmospheres for one minute. Follow-up angiography demonstrates dramatic improvement in the area of stricture. No change in the distal outflow. Pursestring suture of 4-0 Monocryl is placed and the sheath is removed.   INTERPRETATION: Initial views of the fistula demonstrate that there is a stenosis of approximately 65 to 70%, which appears to correlate with the first buttonhole. More proximal buttonhole appears widely patent and the fistula itself is otherwise patent. Veins of the upper arm are patent and the central veins are all widely patent. With the balloon inflated, reflux image of the arterial anastomosis demonstrates that the vein itself as it courses toward the artery is smoothly tapered with a minimal diameter of approximately 4 mm.   Following angioplasty to a maximum of 7 mm, there is resolution of the stenosis at the level of the buttonhole.   SUMMARY: Successful salvage of left radiocephalic fistula as described above.   ____________________________ Katha Cabal, MD ggs:bjt D: 12/22/2011 11:12:11 ET T: 12/22/2011 11:32:04 ET JOB#: [id number]  cc: Katha Cabal, MD, <Dictator> Katha Cabal MD ELECTRONICALLY SIGNED 12/27/2011 16:45
